# Patient Record
Sex: Male | Born: 1990 | Hispanic: Yes | Marital: Single | Smoking: Former smoker
Health system: Southern US, Community
[De-identification: ages and names within clinical notes are randomized; demographics above are authoritative.]

## PROBLEM LIST (undated history)

## (undated) DIAGNOSIS — R0600 Dyspnea, unspecified: Secondary | ICD-10-CM

## (undated) DIAGNOSIS — I10 Essential (primary) hypertension: Secondary | ICD-10-CM

## (undated) DIAGNOSIS — N186 End stage renal disease: Secondary | ICD-10-CM

---
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
## 2019-03-29 ENCOUNTER — Inpatient Hospital Stay (HOSPITAL_COMMUNITY): Payer: Medicaid Other

## 2019-03-29 ENCOUNTER — Encounter (HOSPITAL_COMMUNITY): Payer: Self-pay | Admitting: Gastroenterology

## 2019-03-29 ENCOUNTER — Encounter (HOSPITAL_COMMUNITY)
Admission: EM | Disposition: A | Discharge status: Home / Self Care | Payer: Self-pay | Source: Other Acute Inpatient Hospital | Attending: Internal Medicine

## 2019-03-29 ENCOUNTER — Encounter (HOSPITAL_COMMUNITY): Payer: Self-pay | Admitting: Anesthesiology

## 2019-03-29 ENCOUNTER — Inpatient Hospital Stay (HOSPITAL_COMMUNITY)
Admission: EM | Admit: 2019-03-29 | Discharge: 2019-04-06 | DRG: 356 | Disposition: A | Discharge status: Home / Self Care | Payer: Medicaid Other | Source: Other Acute Inpatient Hospital | Attending: Internal Medicine | Admitting: Internal Medicine

## 2019-03-29 DIAGNOSIS — K2210 Ulcer of esophagus without bleeding: Secondary | ICD-10-CM | POA: Diagnosis not present

## 2019-03-29 DIAGNOSIS — E871 Hypo-osmolality and hyponatremia: Secondary | ICD-10-CM | POA: Diagnosis not present

## 2019-03-29 DIAGNOSIS — Z452 Encounter for adjustment and management of vascular access device: Secondary | ICD-10-CM

## 2019-03-29 DIAGNOSIS — N179 Acute kidney failure, unspecified: Secondary | ICD-10-CM | POA: Diagnosis not present

## 2019-03-29 DIAGNOSIS — K254 Chronic or unspecified gastric ulcer with hemorrhage: Secondary | ICD-10-CM | POA: Diagnosis not present

## 2019-03-29 DIAGNOSIS — K922 Gastrointestinal hemorrhage, unspecified: Secondary | ICD-10-CM

## 2019-03-29 DIAGNOSIS — R34 Anuria and oliguria: Secondary | ICD-10-CM | POA: Diagnosis not present

## 2019-03-29 DIAGNOSIS — R569 Unspecified convulsions: Secondary | ICD-10-CM | POA: Diagnosis present

## 2019-03-29 DIAGNOSIS — Z01818 Encounter for other preprocedural examination: Secondary | ICD-10-CM

## 2019-03-29 DIAGNOSIS — I161 Hypertensive emergency: Secondary | ICD-10-CM | POA: Diagnosis present

## 2019-03-29 DIAGNOSIS — I120 Hypertensive chronic kidney disease with stage 5 chronic kidney disease or end stage renal disease: Secondary | ICD-10-CM | POA: Diagnosis not present

## 2019-03-29 DIAGNOSIS — Z4659 Encounter for fitting and adjustment of other gastrointestinal appliance and device: Secondary | ICD-10-CM

## 2019-03-29 DIAGNOSIS — K2211 Ulcer of esophagus with bleeding: Secondary | ICD-10-CM | POA: Diagnosis not present

## 2019-03-29 DIAGNOSIS — K921 Melena: Secondary | ICD-10-CM

## 2019-03-29 DIAGNOSIS — J9601 Acute respiratory failure with hypoxia: Secondary | ICD-10-CM | POA: Diagnosis not present

## 2019-03-29 DIAGNOSIS — E8770 Fluid overload, unspecified: Secondary | ICD-10-CM | POA: Diagnosis not present

## 2019-03-29 DIAGNOSIS — Z9889 Other specified postprocedural states: Secondary | ICD-10-CM

## 2019-03-29 DIAGNOSIS — K250 Acute gastric ulcer with hemorrhage: Secondary | ICD-10-CM | POA: Diagnosis not present

## 2019-03-29 DIAGNOSIS — D696 Thrombocytopenia, unspecified: Secondary | ICD-10-CM | POA: Diagnosis present

## 2019-03-29 DIAGNOSIS — R0902 Hypoxemia: Secondary | ICD-10-CM

## 2019-03-29 DIAGNOSIS — K920 Hematemesis: Secondary | ICD-10-CM | POA: Diagnosis not present

## 2019-03-29 DIAGNOSIS — Z8673 Personal history of transient ischemic attack (TIA), and cerebral infarction without residual deficits: Secondary | ICD-10-CM

## 2019-03-29 DIAGNOSIS — N186 End stage renal disease: Secondary | ICD-10-CM | POA: Diagnosis not present

## 2019-03-29 DIAGNOSIS — F17210 Nicotine dependence, cigarettes, uncomplicated: Secondary | ICD-10-CM | POA: Diagnosis present

## 2019-03-29 DIAGNOSIS — I10 Essential (primary) hypertension: Secondary | ICD-10-CM

## 2019-03-29 DIAGNOSIS — I169 Hypertensive crisis, unspecified: Secondary | ICD-10-CM | POA: Diagnosis present

## 2019-03-29 DIAGNOSIS — D62 Acute posthemorrhagic anemia: Secondary | ICD-10-CM | POA: Diagnosis not present

## 2019-03-29 DIAGNOSIS — Z992 Dependence on renal dialysis: Secondary | ICD-10-CM | POA: Diagnosis not present

## 2019-03-29 DIAGNOSIS — R079 Chest pain, unspecified: Secondary | ICD-10-CM | POA: Diagnosis not present

## 2019-03-29 DIAGNOSIS — D631 Anemia in chronic kidney disease: Secondary | ICD-10-CM | POA: Diagnosis present

## 2019-03-29 DIAGNOSIS — G9349 Other encephalopathy: Secondary | ICD-10-CM | POA: Diagnosis not present

## 2019-03-29 DIAGNOSIS — M7989 Other specified soft tissue disorders: Secondary | ICD-10-CM | POA: Diagnosis not present

## 2019-03-29 DIAGNOSIS — F419 Anxiety disorder, unspecified: Secondary | ICD-10-CM | POA: Diagnosis present

## 2019-03-29 DIAGNOSIS — M79609 Pain in unspecified limb: Secondary | ICD-10-CM | POA: Diagnosis not present

## 2019-03-29 DIAGNOSIS — G43909 Migraine, unspecified, not intractable, without status migrainosus: Secondary | ICD-10-CM | POA: Diagnosis present

## 2019-03-29 DIAGNOSIS — J811 Chronic pulmonary edema: Secondary | ICD-10-CM

## 2019-03-29 HISTORY — DX: Essential (primary) hypertension: I10

## 2019-03-29 HISTORY — PX: ESOPHAGOGASTRODUODENOSCOPY (EGD) WITH PROPOFOL: SHX5813

## 2019-03-29 HISTORY — PX: HEMOSTASIS CLIP PLACEMENT: SHX6857

## 2019-03-29 LAB — COMPREHENSIVE METABOLIC PANEL
ALT: 30 U/L (ref 0–44)
AST: 20 U/L (ref 15–41)
Albumin: 1.9 g/dL — ABNORMAL LOW (ref 3.5–5.0)
Alkaline Phosphatase: 57 U/L (ref 38–126)
Anion gap: 17 — ABNORMAL HIGH (ref 5–15)
BUN: 143 mg/dL — ABNORMAL HIGH (ref 6–20)
CO2: 14 mmol/L — ABNORMAL LOW (ref 22–32)
Calcium: 6.7 mg/dL — ABNORMAL LOW (ref 8.9–10.3)
Chloride: 100 mmol/L (ref 98–111)
Creatinine, Ser: 21.61 mg/dL — ABNORMAL HIGH (ref 0.61–1.24)
GFR calc Af Amer: 3 mL/min — ABNORMAL LOW (ref >60)
GFR calc non Af Amer: 2 mL/min — ABNORMAL LOW (ref >60)
Glucose, Bld: 118 mg/dL — ABNORMAL HIGH (ref 70–99)
Potassium: 4.9 mmol/L (ref 3.5–5.1)
Sodium: 131 mmol/L — ABNORMAL LOW (ref 135–145)
Total Bilirubin: 0.7 mg/dL (ref 0.3–1.2)
Total Protein: 3.6 g/dL — ABNORMAL LOW (ref 6.5–8.1)

## 2019-03-29 LAB — CBC WITH DIFFERENTIAL/PLATELET
Abs Immature Granulocytes: 0 10*3/uL (ref 0.00–0.07)
Basophils Absolute: 0 10*3/uL (ref 0.0–0.1)
Basophils Relative: 0 %
Eosinophils Absolute: 0.1 10*3/uL (ref 0.0–0.5)
Eosinophils Relative: 1 %
HCT: 13.8 % — ABNORMAL LOW (ref 39.0–52.0)
Hemoglobin: 4.8 g/dL — CL (ref 13.0–17.0)
Lymphocytes Relative: 4 %
Lymphs Abs: 0.6 10*3/uL — ABNORMAL LOW (ref 0.7–4.0)
MCH: 30.6 pg (ref 26.0–34.0)
MCHC: 34.8 g/dL (ref 30.0–36.0)
MCV: 87.9 fL (ref 80.0–100.0)
Monocytes Absolute: 0 10*3/uL — ABNORMAL LOW (ref 0.1–1.0)
Monocytes Relative: 0 %
Neutro Abs: 13.8 10*3/uL — ABNORMAL HIGH (ref 1.7–7.7)
Neutrophils Relative %: 95 %
Platelets: 148 10*3/uL — ABNORMAL LOW (ref 150–400)
RBC: 1.57 MIL/uL — ABNORMAL LOW (ref 4.22–5.81)
RDW: 15 % (ref 11.5–15.5)
WBC: 14.5 10*3/uL — ABNORMAL HIGH (ref 4.0–10.5)
nRBC: 0 % (ref 0.0–0.2)
nRBC: 6 /100 WBC — ABNORMAL HIGH

## 2019-03-29 LAB — POCT I-STAT 7, (LYTES, BLD GAS, ICA,H+H)
Acid-base deficit: 9 mmol/L — ABNORMAL HIGH (ref 0.0–2.0)
Bicarbonate: 16.1 mmol/L — ABNORMAL LOW (ref 20.0–28.0)
Calcium, Ion: 0.94 mmol/L — ABNORMAL LOW (ref 1.15–1.40)
HCT: 20 % — ABNORMAL LOW (ref 39.0–52.0)
Hemoglobin: 6.8 g/dL — CL (ref 13.0–17.0)
O2 Saturation: 98 %
Patient temperature: 99.7
Potassium: 4.6 mmol/L (ref 3.5–5.1)
Sodium: 130 mmol/L — ABNORMAL LOW (ref 135–145)
TCO2: 17 mmol/L — ABNORMAL LOW (ref 22–32)
pCO2 arterial: 32.7 mmHg (ref 32.0–48.0)
pH, Arterial: 7.303 — ABNORMAL LOW (ref 7.350–7.450)
pO2, Arterial: 109 mmHg — ABNORMAL HIGH (ref 83.0–108.0)

## 2019-03-29 LAB — PREPARE RBC (CROSSMATCH)

## 2019-03-29 LAB — GLUCOSE, CAPILLARY
Glucose-Capillary: 105 mg/dL — ABNORMAL HIGH (ref 70–99)
Glucose-Capillary: 111 mg/dL — ABNORMAL HIGH (ref 70–99)
Glucose-Capillary: 84 mg/dL (ref 70–99)
Glucose-Capillary: 90 mg/dL (ref 70–99)

## 2019-03-29 LAB — CBC
HCT: 12.7 % — ABNORMAL LOW (ref 39.0–52.0)
Hemoglobin: 4.3 g/dL — CL (ref 13.0–17.0)
MCH: 30.1 pg (ref 26.0–34.0)
MCHC: 33.9 g/dL (ref 30.0–36.0)
MCV: 88.8 fL (ref 80.0–100.0)
Platelets: 121 10*3/uL — ABNORMAL LOW (ref 150–400)
RBC: 1.43 MIL/uL — ABNORMAL LOW (ref 4.22–5.81)
RDW: 15.3 % (ref 11.5–15.5)
WBC: 12.1 10*3/uL — ABNORMAL HIGH (ref 4.0–10.5)
nRBC: 0 % (ref 0.0–0.2)

## 2019-03-29 LAB — HEMOGLOBIN AND HEMATOCRIT, BLOOD
HCT: 20.7 % — ABNORMAL LOW (ref 39.0–52.0)
Hemoglobin: 7.2 g/dL — ABNORMAL LOW (ref 13.0–17.0)

## 2019-03-29 LAB — PROTIME-INR
INR: 1.4 — ABNORMAL HIGH (ref 0.8–1.2)
Prothrombin Time: 16.6 seconds — ABNORMAL HIGH (ref 11.4–15.2)

## 2019-03-29 LAB — LACTIC ACID, PLASMA: Lactic Acid, Venous: 0.9 mmol/L (ref 0.5–1.9)

## 2019-03-29 LAB — ABO/RH: ABO/RH(D): A POS

## 2019-03-29 LAB — MRSA PCR SCREENING: MRSA by PCR: NEGATIVE

## 2019-03-29 LAB — TRIGLYCERIDES: Triglycerides: 360 mg/dL — ABNORMAL HIGH (ref <150)

## 2019-03-29 SURGERY — ESOPHAGOGASTRODUODENOSCOPY (EGD) WITH PROPOFOL
Anesthesia: Monitor Anesthesia Care

## 2019-03-29 MED ORDER — CHLORHEXIDINE GLUCONATE 0.12 % MT SOLN
15.0000 mL | Freq: Two times a day (BID) | OROMUCOSAL | Status: DC
  Administered 2019-03-29 – 2019-04-06 (×15): 15 mL via OROMUCOSAL
  Filled 2019-03-29 (×10): qty 15

## 2019-03-29 MED ORDER — ETOMIDATE 2 MG/ML IV SOLN
20.0000 mg | Freq: Once | INTRAVENOUS | Status: AC
  Administered 2019-03-29: 20 mg via INTRAVENOUS

## 2019-03-29 MED ORDER — FENTANYL CITRATE (PF) 100 MCG/2ML IJ SOLN
100.0000 ug | Freq: Once | INTRAMUSCULAR | Status: AC
  Administered 2019-03-29: 100 ug via INTRAVENOUS

## 2019-03-29 MED ORDER — MIDAZOLAM HCL 2 MG/2ML IJ SOLN
INTRAMUSCULAR | Status: AC
  Filled 2019-03-29: qty 2

## 2019-03-29 MED ORDER — MIDAZOLAM HCL (PF) 5 MG/ML IJ SOLN
INTRAMUSCULAR | Status: AC
  Filled 2019-03-29: qty 1

## 2019-03-29 MED ORDER — HYDRALAZINE HCL 20 MG/ML IJ SOLN
5.0000 mg | INTRAMUSCULAR | Status: DC | PRN
  Administered 2019-03-29 – 2019-04-02 (×3): 5 mg via INTRAVENOUS
  Filled 2019-03-29 (×3): qty 1

## 2019-03-29 MED ORDER — SODIUM CHLORIDE 0.9 % IV SOLN
80.0000 mg | Freq: Two times a day (BID) | INTRAVENOUS | Status: DC
  Administered 2019-03-29 (×2): 80 mg via INTRAVENOUS
  Filled 2019-03-29 (×6): qty 80

## 2019-03-29 MED ORDER — FENTANYL CITRATE (PF) 100 MCG/2ML IJ SOLN
INTRAMUSCULAR | Status: AC
  Filled 2019-03-29: qty 2

## 2019-03-29 MED ORDER — SODIUM CHLORIDE 0.9% IV SOLUTION
Freq: Once | INTRAVENOUS | Status: AC
  Administered 2019-03-29: 18:00:00 via INTRAVENOUS

## 2019-03-29 MED ORDER — LABETALOL HCL 5 MG/ML IV SOLN
10.0000 mg | Freq: Three times a day (TID) | INTRAVENOUS | Status: DC
  Administered 2019-03-29 – 2019-04-01 (×6): 10 mg via INTRAVENOUS
  Filled 2019-03-29 (×7): qty 4

## 2019-03-29 MED ORDER — ACETAMINOPHEN 325 MG PO TABS: 650.0000 mg | ORAL_TABLET | ORAL | Status: DC | PRN

## 2019-03-29 MED ORDER — LEVETIRACETAM IN NACL 500 MG/100ML IV SOLN
500.0000 mg | Freq: Two times a day (BID) | INTRAVENOUS | Status: DC
  Administered 2019-03-29 – 2019-03-31 (×6): 500 mg via INTRAVENOUS
  Filled 2019-03-29 (×7): qty 100

## 2019-03-29 MED ORDER — SODIUM CHLORIDE 0.9% IV SOLUTION
Freq: Once | INTRAVENOUS | Status: AC
  Administered 2019-03-29: 13:00:00 via INTRAVENOUS

## 2019-03-29 MED ORDER — SODIUM CHLORIDE 0.9 % IV SOLN
25.0000 ug | Freq: Once | INTRAVENOUS | Status: AC
  Administered 2019-03-29: 16:00:00 25 ug via INTRAVENOUS
  Filled 2019-03-29: qty 6.25

## 2019-03-29 MED ORDER — CHLORHEXIDINE GLUCONATE CLOTH 2 % EX PADS
6.0000 | MEDICATED_PAD | Freq: Every day | CUTANEOUS | Status: DC
  Administered 2019-03-30: 6 via TOPICAL

## 2019-03-29 MED ORDER — CLEVIDIPINE BUTYRATE 0.5 MG/ML IV EMUL
0.0000 mg/h | INTRAVENOUS | Status: DC
  Administered 2019-03-29: 6 mg/h via INTRAVENOUS
  Filled 2019-03-29 (×2): qty 50

## 2019-03-29 MED ORDER — ACETAMINOPHEN 650 MG RE SUPP
650.0000 mg | RECTAL | Status: DC | PRN
  Filled 2019-03-29: qty 1

## 2019-03-29 MED ORDER — FENTANYL CITRATE (PF) 100 MCG/2ML IJ SOLN: 50.0000 ug | INTRAMUSCULAR | Status: DC | PRN

## 2019-03-29 MED ORDER — LABETALOL HCL 100 MG PO TABS
50.0000 mg | ORAL_TABLET | Freq: Two times a day (BID) | ORAL | Status: DC
  Filled 2019-03-29 (×2): qty 0.5

## 2019-03-29 MED ORDER — ORAL CARE MOUTH RINSE
15.0000 mL | Freq: Two times a day (BID) | OROMUCOSAL | Status: DC
  Administered 2019-03-30 – 2019-04-06 (×13): 15 mL via OROMUCOSAL

## 2019-03-29 MED ORDER — HYDRALAZINE HCL 50 MG PO TABS
50.0000 mg | ORAL_TABLET | Freq: Three times a day (TID) | ORAL | Status: DC
  Filled 2019-03-29 (×2): qty 1

## 2019-03-29 MED ORDER — AMLODIPINE BESYLATE 10 MG PO TABS
10.0000 mg | ORAL_TABLET | Freq: Once | ORAL | Status: DC
  Filled 2019-03-29: qty 1

## 2019-03-29 MED ORDER — ONDANSETRON HCL 4 MG/2ML IJ SOLN
4.0000 mg | Freq: Four times a day (QID) | INTRAMUSCULAR | Status: DC | PRN
  Administered 2019-04-03 – 2019-04-06 (×2): 4 mg via INTRAVENOUS
  Filled 2019-03-29 (×3): qty 2

## 2019-03-29 MED ORDER — EPINEPHRINE 1 MG/10ML IJ SOSY
PREFILLED_SYRINGE | INTRAMUSCULAR | Status: AC
  Filled 2019-03-29: qty 20

## 2019-03-29 MED ORDER — MIDAZOLAM HCL (PF) 10 MG/2ML IJ SOLN
INTRAMUSCULAR | Status: DC | PRN
  Administered 2019-03-29: 2 mg via INTRAVENOUS
  Administered 2019-03-29: 1 mg via INTRAVENOUS
  Administered 2019-03-29: 2 mg via INTRAVENOUS

## 2019-03-29 MED ORDER — ROCURONIUM BROMIDE 50 MG/5ML IV SOLN
80.0000 mg | Freq: Once | INTRAVENOUS | Status: AC
  Administered 2019-03-29: 15:00:00 80 mg via INTRAVENOUS

## 2019-03-29 MED ORDER — MIDAZOLAM HCL 2 MG/2ML IJ SOLN
2.0000 mg | Freq: Once | INTRAMUSCULAR | Status: AC
  Administered 2019-03-29: 15:00:00 2 mg via INTRAVENOUS

## 2019-03-29 MED ORDER — PROPOFOL 1000 MG/100ML IV EMUL
5.0000 ug/kg/min | INTRAVENOUS | Status: DC
  Administered 2019-03-29: 50 ug/kg/min via INTRAVENOUS
  Administered 2019-03-29: 20 ug/kg/min via INTRAVENOUS
  Administered 2019-03-29: 50 ug/kg/min via INTRAVENOUS
  Administered 2019-03-30 (×2): 80 ug/kg/min via INTRAVENOUS
  Administered 2019-03-30 (×2): 70 ug/kg/min via INTRAVENOUS
  Filled 2019-03-29 (×8): qty 100

## 2019-03-29 MED ORDER — FENTANYL 2500MCG IN NS 250ML (10MCG/ML) PREMIX INFUSION
0.0000 ug/h | INTRAVENOUS | Status: DC
  Administered 2019-03-29: 25 ug/h via INTRAVENOUS
  Administered 2019-03-30: 200 ug/h via INTRAVENOUS
  Filled 2019-03-29 (×3): qty 250

## 2019-03-29 SURGICAL SUPPLY — 15 items

## 2019-03-29 NOTE — Progress Notes (Signed)
Obtained ABG, it read; PH 7.31 PCO2 31.8 PO2 105 HCO3 16.1 Hb 6.8 RN notified.

## 2019-03-29 NOTE — Progress Notes (Signed)
Received call from Endo, Anesthesiologist states does not want to do EGD, feels pt is not stable enough.  Relayed to CCM MD and GI, plan for bedside EGD

## 2019-03-29 NOTE — Progress Notes (Addendum)
Pt w/ copious amounts of bloody liquid stool.  Received call from lab, pts hgb 4.3.  Relayed all to CCM MD, PRBCs ordered stat, GI consulted

## 2019-03-29 NOTE — Progress Notes (Signed)
Spoke w/ HD RN who states pt to receive HD tomorrow due to numerous blood products currently running.

## 2019-03-29 NOTE — Progress Notes (Addendum)
eLink Physician-Brief Progress Note Patient Name: [name] DOB: June 18, 1991 MRN: [id number]   Date of Service  03/29/2019  HPI/Events of Note  A 28 year old male admitted to the ICU with hypertensive emergency.  Could not give any medications to NG/OG because the patient does not have any tube secondary to upper GI bleed status post EGD with clipping.   eICU Interventions  Changed PRN oral Tylenol to PRN Tylenol suppository.  Changed from oral labetalol to scheduled IV labetalol.  Discontinued oral hydralazine and can use PRN IV hydralazine which is already ordered.     Intervention Category Intermediate Interventions: Medication change / dose adjustment;Hypertension - evaluation and management;Communication with other healthcare providers and/or family  Mady Gemma 03/29/2019, 10:23 PM   2:32 AM Hemoglobin dropped from 7.2 to 6.9. Patient is hemodynamically stable.   Ordered 1 unit of packed RBC for transfusion

## 2019-03-29 NOTE — Procedures (Signed)
Central Venous Catheter Insertion Procedure Note [name] [id number] 03-Aug-1991  Procedure: Insertion of Central Venous Catheter Indications: Assessment of intravascular volume, Drug and/or fluid administration and Frequent blood sampling  Procedure Details Consent: Risks of procedure as well as the alternatives and risks of each were explained to the (patient/caregiver).  Consent for procedure obtained. Time Out: Verified patient identification, verified procedure, site/side was marked, verified correct patient position, special equipment/implants available, medications/allergies/relevent history reviewed, required imaging and test results available.  Performed  Maximum sterile technique was used including antiseptics, cap, gloves, gown, hand hygiene, mask and sheet. Skin prep: Chlorhexidine; local anesthetic administered A antimicrobial bonded/coated triple lumen catheter was placed in the left internal jugular vein to 20 cm using the Seldinger technique. Biopatch applied, sutured in place.  Evaluation Blood flow good Complications: No apparent complications Patient tolerated the procedure well. Chest X-ray ordered to verify placement.  CXR: pending.   Procedure performed under direct supervision of Dr. Tamala Julian and with ultrasound guidance for real time vessel cannulation.     Noe Gens, NP-C [facility] Pgr: [phone] or if no answer [phone] 03/29/2019, 1:55 PM

## 2019-03-29 NOTE — Consult Note (Signed)
[name] Admit Date: 03/29/2019 03/29/2019 [name] Requesting Physician:  Tamala Julian MD  Reason for Consult:  Renal Failure, Azotemia, Anemia HPI:  28 year old male transferred in this morning from [facility].    PMH Incudes:  Unclear, patient not forthcoming, but appears receives very little medical care  Creatinine of 1.4 on 11/2017  Patient was admitted approximately 1 week ago after presenting with nausea/vomiting/diarrhea and then returning with a seizure-like episode and severe hypertension.  Patient initially required parenteral hypertensive medications and subsequently improved with transition to orals.  Patient also identified to have acute on chronic renal failure and initiated hemodialysis at outside facility.  During course of admission patient developed anemia and hematochezia and had hematemesis.  Underwent colonoscopy with findings concerning for ischemic colitis, EGD with findings of ulcers at the GE junction with intervention performed.  Unfortunately patient continued to have evidence of upper and lower hemorrhage and was transferred for surgical evaluation.  Admission hemoglobin here is 4.8, on repeat 4.1.  Patient has a right IJ tunneled dialysis catheter.  Currently, BUN is 143, creatinine 21.6.  His creatinine was 1.39 12/02/2017 in care everywhere.    Creatinine, Ser (mg/dL)  Date Value  03/29/2019 21.61 (H)  ]  ROS Balance of 12 systems is negative w/ exceptions as above  PMH No past medical history on file. PSH [facility] No family history on file. SH  has no history on file for tobacco, alcohol, and drug. Allergies Not on File Home medications Prior to Admission medications   Not on File    Current Medications Scheduled Meds: . amLODipine  10 mg Oral Once  . chlorhexidine  15 mL Mouth Rinse BID  . hydrALAZINE  50 mg Oral Q8H  . labetalol  50 mg Oral BID  . mouth rinse  15 mL Mouth Rinse q12n4p   Continuous Infusions: .  clevidipine Stopped (03/29/19 1211)  . desmopressin (DDAVP) IV    . levETIRAcetam    . pantoprazole (PROTONIX) IV 80 mg (03/29/19 1207)   PRN Meds:.acetaminophen, fentaNYL (SUBLIMAZE) injection, hydrALAZINE, ondansetron (ZOFRAN) IV  CBC Recent Labs  Lab 03/29/19 1135 03/29/19 1243  WBC 14.5* 12.1*  NEUTROABS 13.8*  --   HGB 4.8* 4.3*  HCT 13.8* 12.7*  MCV 87.9 88.8  PLT 148* 174*   Basic Metabolic Panel Recent Labs  Lab 03/29/19 1135  NA 131*  K 4.9  CL 100  CO2 14*  GLUCOSE 118*  BUN 143*  CREATININE 21.61*  CALCIUM 6.7*    Physical Exam  Blood pressure (!) 134/91, pulse 91, temperature 98.3 F (36.8 C), temperature source Oral, resp. rate 17, SpO2 99 %. GEN: Patient provides very little information, alert and awake ENT: NCAT EYES: EOMI CV: RRR, normal S1 and S2 PULM: Anterior auscultation, clear ABD: Mild diffuse tenderness, no rebound or guarding SKIN: No rashes or lesions, right IJ tunneled dialysis catheter bandaged with no erythema or purulence EXT: No peripheral edema  Assessment 12M with severe renal failure of unclear chronicity (Had SCr 1.4 11/2017) and severe anemia with gastrointestinal hemorrhage  1. Renal failure, unclear chronicity: Started HD at outside facility; significant azotemia related to renal failure and GI hemorrhage; has R IJ TDC 2. UGIB and LGIB, GI to eval, GE ulcers and ? Ischemic colitis at OSH 3. ABLA Severe 4. HTN, stable currently  Plan 1. HD today once hemoglobin greater than 6 and patient hemodynamically stable: 2h, No UF, 2K, TDC, Qb 250, no heparin 2. Likely will require HD  again tomorrow, gradual up titration of dialysis intensity 3. Start Aranesp 142mcg today 4. At dialysis if hemoglobin is less than 7 will transfuse 2 units of PRBCs 5. Will follow along closely 6. Daily weights, Daily Renal Panel, Strict I/Os, Avoid nephrotoxins (NSAIDs, judicious IV Contrast)    Pearson Grippe MD 03/29/2019, 2:11 PM

## 2019-03-29 NOTE — H&P (Signed)
NAME:  [name], MRN:  [id number], DOB:  February 25, 1991, LOS: 0 ADMISSION DATE:  03/29/2019, CONSULTATION DATE:  03/29/2019 REFERRING MD:  [facility], CHIEF COMPLAINT:  HTN   Brief History   28 y.o male with CKD Stage V, malignant HTN, and prior CVA presented to an outside facility with hypertensive emergency (seizure, possibly ischemic colitis) and UGIB. Managed acutely with IV Clevidipine and emergent EGD. S/p 3 clips to a gastric ulcer but continued to have hematemesis and transferred to [facility] for further evaluation.   History of present illness   Presented to an outside hospital with 5 days of N/V and diffuse abdominal pain. Initially found to have a BP >200/100. While in the ED had witness seizure like activity. He was started on a Clevidine drip and admitted for further evaluation. Labs were significant for acute on chronic renal failure. CT of the head was unremarkable, followed by a CT abdomen that illustrated acute colitis. While hospitalized he then developed  Hematemesis and hematochezia. Subsequently went for EGD and sigmoidoscopy. EGD illustrated diffuse gastritis and a single gastric ulcer actively bleeding. S/p 3 clips. Sigmoidoscopy illustrated diffuse friable, erythematous, boggy tissue. Biopsies taken but the report is unavailable. GI pathogen panel including C. Diff, was negative but he was continued on IV metronidazole. The morning of transfer he continued to have diffuse abdominal pain with hematemesis and he was transferred for further evaluation.    In regards to the patient's renal failure he is baseline CKD Stage V that has since progressed with this acute event. He has a HD cath in the right IJ. Last session on 7/17.   Past Medical History  Malignant HTN  CKD Stage V CVA  Significant Hospital Events   EGD > diffuse gastritis and a single gastric ulcer actively bleeding Sigmoidoscopy > diffuse friable, erythematous, boggy tissue HD > Last session 7/17  Consults:   GI  Nephrology   Procedures:  Right IJ HD cath 7/17  Significant Diagnostic Tests:  Outside:  CT head > unremarkable  CT abdomen > diffuse colitis  EGD > diffuse gastritis and a single gastric ulcer actively bleeding Sigmoidoscopy > diffuse friable, erythematous, boggy tissue  Micro Data:  GI pathogen panel negative  C. Diff negative  COVID negative   Antimicrobials:  Metronidazole    Interim history/subjective:  Continues to have nausea. Lethargic.   Objective   Blood pressure (!) 130/94, pulse 95, temperature 99.5 F (37.5 C), temperature source Oral, resp. rate 17, SpO2 96 %.       No intake or output data in the 24 hours ending 03/29/19 1101 There were no vitals filed for this visit.  Examination: General: Well nourished male, resting comfortably  HENT: Normocephalic, atraumatic, dry mucus membranes Pulm: Good air movement with no wheezing or crackles  CV: RRR, no murmurs, no rubs  Abdomen: Hypoactive bowel sounds, soft, non-distended, diffuse tenderness to palpation  Extremities: Pulses palpable in all extremities, no LE edema  Skin: Warm and dry  Neuro: Lethargic but wake to verbal and physical stimuli   Resolved Hospital Problem list   None  Assessment & Plan:   Hypertensive Emergency  - BP initially > 200/100 and patient had seizure like activity with worsening renal function  - Try to wean IV Clevidine drip. Goal BP <150/100 - Restart home Amlodipine, Labetalol, and Hydralazine  - PRNs to control nausea  - UDS negative at outside hospital  - Needs investigation for secondary causes of malignant HTN   UGIB, gastric ulcer  -  Repeat H&H  - Type and screen  - 2 large bore peripheral IVs  - IV PPI 80 mg BID  - Transfuse for Hgb <7.0 or hemodynamically unstable  - Will consult GI if continues to have hematemesis/hematochezia  Acute Colitis  - Likely ischemic vs infectious  - Hold Abx for now   CKD Stage V  - Right HD cath in place  - Last HD  session 7/17  - Repeat CMP and CBC  - Consult Nephrology  - Renal ultrasound/doppler  - UA   Best practice:  Diet: Clear liquids  Pain/Anxiety/Delirium protocol (if indicated): None VAP protocol (if indicated): None DVT prophylaxis: SCDs GI prophylaxis: Protonix Glucose control: Monitor and start if needed Mobility: As tolerated Code Status: Full Family Communication: Family notified  Disposition: ICU  Labs   CBC: No results for input(s): WBC, NEUTROABS, HGB, HCT, MCV, PLT in the last 168 hours.  Basic Metabolic Panel: No results for input(s): NA, K, CL, CO2, GLUCOSE, BUN, CREATININE, CALCIUM, MG, PHOS in the last 168 hours. GFR: CrCl cannot be calculated (No successful lab value found.). No results for input(s): PROCALCITON, WBC, LATICACIDVEN in the last 168 hours.  Liver Function Tests: No results for input(s): AST, ALT, ALKPHOS, BILITOT, PROT, ALBUMIN in the last 168 hours. No results for input(s): LIPASE, AMYLASE in the last 168 hours. No results for input(s): AMMONIA in the last 168 hours.  ABG No results found for: PHART, PCO2ART, PO2ART, HCO3, TCO2, ACIDBASEDEF, O2SAT   Coagulation Profile: No results for input(s): INR, PROTIME in the last 168 hours.  Cardiac Enzymes: No results for input(s): CKTOTAL, CKMB, CKMBINDEX, TROPONINI in the last 168 hours.  HbA1C: No results found for: HGBA1C  CBG: No results for input(s): GLUCAP in the last 168 hours.  Review of Systems:   12 point ROS complete. All negative aside from those mentioned in HPI  Past Medical History  He,  has no past medical history on file.   Surgical History   No surgical history   Social History     Lives with his fiance. Denies the use of illicit substances.   Family History   His family history is not on file.   Allergies Not on File   Home Medications  Prior to Admission medications   Not on File         Ina Homes, MD  [facility] PGY 3  Pager: [phone]

## 2019-03-29 NOTE — Procedures (Signed)
Intubation Procedure Note [name] [id number] March 06, 1991  Procedure: Intubation Indications: Airway protection and maintenance  Procedure Details Consent: Risks of procedure as well as the alternatives and risks of each were explained to the (patient/caregiver).  Consent for procedure obtained. Time Out: Verified patient identification, verified procedure, site/side was marked, verified correct patient position, special equipment/implants available, medications/allergies/relevent history reviewed, required imaging and test results available.  Performed  Maximum sterile technique was used including antiseptics, cap, gloves, hand hygiene and mask.  MAC and 3    Evaluation Hemodynamic Status: BP stable throughout; O2 sats: stable throughout Patient's Current Condition: stable Complications: No apparent complications Patient did tolerate procedure well. Chest X-ray ordered to verify placement.  CXR: pending.   [name] 03/29/2019

## 2019-03-29 NOTE — Op Note (Signed)
[facility] Patient Name: [name] Procedure Date : 03/29/2019 MRN: [id number] Attending MD: Mauri Pole , MD Date of Birth: Dec 17, 1990 CSN: [id number] Age: 28 Admit Type: Inpatient Procedure:                Upper GI endoscopy Indications:              Active gastrointestinal bleeding Providers:                Mauri Pole, MD, William Dalton,                            Technician, Glori Bickers, RN Referring MD:              Medicines:                Monitored Anesthesia Care Complications:            No immediate complications. Estimated Blood Loss:     Estimated blood loss was minimal. Procedure:                Pre-Anesthesia Assessment:                           - Prior to the procedure, a History and Physical                            was performed, and patient medications and                            allergies were reviewed. The patient's tolerance of                            previous anesthesia was also reviewed. The risks                            and benefits of the procedure and the sedation                            options and risks were discussed with the patient.                            All questions were answered, and informed consent                            was obtained. Prior Anticoagulants: The patient has                            taken no previous anticoagulant or antiplatelet                            agents. ASA Grade Assessment: IV - A patient with                            severe systemic disease that is a constant threat  to life. After reviewing the risks and benefits,                            the patient was deemed in satisfactory condition to                            undergo the procedure.                           After obtaining informed consent, the endoscope was                            passed under direct vision. Throughout the                            procedure, the  patient's blood pressure, pulse, and                            oxygen saturations were monitored continuously. The                            [id number]-H190 ([id number]) Olympus gastroscope was                            introduced through the mouth, and advanced to the                            second part of duodenum. The [id number]-[id number] ([id number])                            Olympus therapeutic gastroscope was introduced                            through the mouth, and advanced to the second part                            of duodenum. The upper GI endoscopy was technically                            difficult and complex due to presence of bezoar.                            The patient tolerated the procedure well. Scope In: Scope Out: Findings:      One cratered esophageal ulcer with no bleeding and stigmata of recent       bleeding, adherent clot and hemostatic clip was found 38 to 39 cm from       the incisors. The lesion was 5 mm in largest dimension. For hemostasis,       two hemostatic clips were successfully placed (MR conditional). There       was no bleeding at the end of the procedure.      Large blood clots and food bezoar was found in the gastric fundus.       Attempted removal of clots with roth net and also through  therapeutic       scope, incomplete removal with limited visualization of the gastric       fundus. No active oozing or bleeding      One non-obstructing non-bleeding cratered gastric ulcer with pigmented       material was found in the prepyloric region of the stomach. The lesion       was 10 mm in largest dimension.      The examined duodenum was normal. Impression:               - Non-bleeding esophageal ulcer. Clips (MR                            conditional) were placed.                           - Red blood in the gastric fundus.                           - Non-obstructing non-bleeding gastric ulcer with                            pigmented material.                            - Normal examined duodenum.                           - No specimens collected. Recommendation:           - Patient has a contact number available for                            emergencies. The signs and symptoms of potential                            delayed complications were discussed with the                            patient. Return to normal activities tomorrow.                            Written discharge instructions were provided to the                            patient.                           - Continue PPI gtt                           - NPO.                           - Continue present medications.                           - Repeat upper endoscopy for retreatment if has  persistent bleeding with downtrending Hgb.                           - If hemodynamically unstable, consult IR for                            embolization Procedure Code(s):        --- Professional ---                           [phone], Esophagogastroduodenoscopy, flexible,                            transoral; with control of bleeding, any method Diagnosis Code(s):        --- Professional ---                           K22.10, Ulcer of esophagus without bleeding                           K92.2, Gastrointestinal hemorrhage, unspecified                           K25.9, Gastric ulcer, unspecified as acute or                            chronic, without hemorrhage or perforation CPT copyright 2019 [employer]. All rights reserved. The codes documented in this report are preliminary and upon coder review may  be revised to meet current compliance requirements. Mauri Pole, MD 03/29/2019 4:58:07 PM This report has been signed electronically. Number of Addenda: 0

## 2019-03-29 NOTE — Progress Notes (Signed)
Pt was private pt at [facility].  When asked, pt states does not want to be private pt here, states does not need to create password.  Spoke w/ pts fiance Kathi Der and pts aunt (pt states may provide information to both) to provide updates on pt. Visitation policies also explained.  Both appreciative and state understanding.

## 2019-03-29 NOTE — Progress Notes (Signed)
Per ordering provider, Dr. Tamala Julian , EEG to be done tomorrow due to multiple procedures being done for this patient.

## 2019-03-29 NOTE — Consult Note (Addendum)
[name]: 12:20 PM 03/29/2019  LOS: 0 days    Referring Provider: Dr Tamala Julian  Primary Care Physician: Previous interactions with [place], transitional care on [facility] in [place] Primary Gastroenterologist:  Dr. Trenton Founds in [place].  unassigned [employer].       Reason for Consultation: Hematemesis, burgundy/bloody stool.  Abdominal pain.   HPI: [name] is a 28 y.o. male.  Hx hypertension. CVA.  Migraines.  Patient says that for about 6 days he had been having black/bloody loose stools and abdominal pain especially in the lower abdomen.  For 3 days he been vomiting up dark and burgundy material.  No previous history of ulcers or intestinal problems. 03/23/2019 admission to [facility].  AKI requiring nation of hemodialysis.  CTAP showed diffuse colitis. 03/28/2019 EGD showed localized ulceration, granularity, friability, dripping fresh blood at the GE junction and cardia.  Endoclips were applied, cautery was applied and epinephrine injected.  Diffuse erythema in the body and antrum of the stomach.  Biopsies obtained.  03/28/2019 flexible sigmoidoscopy.  Erythema, friability, congestion at the distal transverse, descending and sigmoid colon, biopsied.  Relatively normal-looking rectal mucosa.  No active bleeding.  Progress notes mention patient having bit his tongue during the seizure.  Treated with IV metronidazole.  C. Difficile and other stool pathogen studies all negative.  Continue to have pain and hematemesis yesterday morning and was transferred to [facility].  Hepatitis a IgM, hepatitis B core IgM, hepatitis B surface antigen, hepatitis C antibody, all negative or nonreactive.  Hepatitis B surface antibody quantitative 28 Patient  recalls transfusion with 2 PRBCs during the admission in [place]. Prior to the onset of the diarrhea and abdominal pain, the patient did not use NSAIDs but he did start using 2-3 Aleve a day after the pain and bloody diarrhea started.  Consumes 2 or 3 "white liquor" drinks daily.  This morning he was still vomiting bloody material and has been passing burgundy bloody stools. Blood pressure improved but still hypertensive. Hgb this morning 4.8.  2 U PRBCs ordered but not initiated yet. IV Protonix drip in place.  Patient takes blood pressure medication but not clear how compliant he is.  He does not have a regular doctor.  He goes to the hospital emergency rooms for his health care. Family history negative for ulcer disease, GI bleeds, cancers.    Past medical history.  See HPI.   Prior to Admission medications   Not on File    Scheduled Meds: . sodium chloride   Intravenous Once  . amLODipine  10 mg Oral Once  . chlorhexidine  15 mL Mouth Rinse BID  . hydrALAZINE  50 mg Oral Q8H  . labetalol  50 mg Oral BID  . mouth rinse  15 mL Mouth Rinse q12n4p   Infusions: . clevidipine Stopped (03/29/19 1211)  . pantoprazole (PROTONIX) IV 80 mg (03/29/19 1207)   PRN Meds: acetaminophen, ondansetron (ZOFRAN) IV   Allergies as of 03/28/2019  . (Not on File)    No  family history on file.  Social History   Socioeconomic History  . Marital status: Single    Spouse name: Not on file  . Number of children: Not on file  . Years of education: Not on file  . Highest education level: Not on file  Occupational History  . Not on file  Social Needs  . Financial resource strain: Not on file  . Food insecurity    Worry: Not on file    Inability: Not on file  . Transportation needs    Medical: Not on file    Non-medical: Not on file  Tobacco Use  . Smoking status: Not on file  Substance and Sexual Activity  . Alcohol use: Not on file  . Drug use: Not on file  . Sexual activity:  Not on file  Lifestyle  . Physical activity    Days per week: Not on file    Minutes per session: Not on file  . Stress: Not on file  Relationships  . Social Herbalist on phone: Not on file    Gets together: Not on file    Attends religious service: Not on file    Active member of club or organization: Not on file    Attends meetings of clubs or organizations: Not on file    Relationship status: Not on file  . Intimate partner violence    Fear of current or ex partner: Not on file    Emotionally abused: Not on file    Physically abused: Not on file    Forced sexual activity: Not on file  Other Topics Concern  . Not on file  Social History Narrative  . Not on file    REVIEW OF SYSTEMS: Constitutional: Weakness. ENT:  No nose bleeds Pulm: Denies shortness of breath or cough. CV:  No palpitations, no LE edema.  Denies chest pain GU:  No hematuria, no frequency GI: See HPI. Heme: Other than the recent GI bleeding, denies unusual bleeding or bruising. Transfusions: See HPI. Neuro:  No headaches, no peripheral tingling or numbness Derm:  No itching, no rash or sores.  Endocrine:  No sweats or chills.  No polyuria or dysuria Immunization: Did not ask him about prior vaccinations. Travel:  None beyond local counties in last few months.    PHYSICAL EXAM: Vital signs in last 24 hours: Vitals:   03/29/19 1100 03/29/19 1145  BP: (!) 123/91   Pulse: 94   Resp: 15   Temp: 98.5 F (36.9 C) 98.8 F (37.1 C)  SpO2: 96%    Wt Readings from Last 3 Encounters:  No data found for Wt    General: Hispanic man who is drowsy and has trouble providing a history.  Looks a bit pale, not acutely ill. Head: No facial asymmetry or swelling.  No signs of head trauma. Eyes: Conjunctiva pale.  No scleral icterus. Ears: Not hard of hearing Nose: No discharge or congestion Mouth: small islets of slightly pale, raised lesions on tongue.  Fairly deep but not bleeding ulcer at the  lateral left tongue. Neck: No JVD, no masses. Lungs: No labored breathing.  Lungs clear bilaterally. Heart: RRR. Abdomen: Tender in the lower abdomen, predominantly in the right lower quadrant.  No guarding or rebound.  Hypoactive bowel sounds.  No distention.. Rectal: Deferred Musc/Skeltl: No joint redness, swelling or erythema. Extremities: Feet are warm.  No CCE. Neurologic: Drowsy.  Oriented x3.  Moves all 4 limbs, no tremor. Skin: No rashes  or sores. Tattoos: Several on his trunk and arms   Psych: Drowsy, laconic/limited speech.  Intake/Output from previous day: No intake/output data recorded. Intake/Output this shift: No intake/output data recorded.  LAB RESULTS: Recent Labs    03/29/19 1135  WBC 14.5*  HGB 4.8*  HCT 13.8*  PLT 148*   BMET No results found for: NA, K, CL, CO2, GLUCOSE, BUN, CREATININE, CALCIUM LFT No results for input(s): PROT, ALBUMIN, AST, ALT, ALKPHOS, BILITOT, BILIDIR, IBILI in the last 72 hours. PT/INR No results found for: INR, PROTIME Hepatitis Panel No results for input(s): HEPBSAG, HCVAB, HEPAIGM, HEPBIGM in the last 72 hours. C-Diff No components found for: CDIFF Lipase  No results found for: LIPASE    RADIOLOGY STUDIES: No results found.   IMPRESSION:   *   GI bleed from ulcer at GE junction. 10/28/2018 EGD by Dr. Posey Pronto revealed the ulcer, this was endoclipped, cauterized and injected.  Has had recurrent bleeding since then. The laceration on his tongue does not look bloody but looks like it could have caused some bleeding.  *   ESRD. ? Previously undiagnosed advanced chronic kidney disease on HD.    PLAN:     *   EGD today.  Continue IV Protonix   Azucena Freed  03/29/2019, 12:20 PM Phone [phone]   Attending physician's note   I have taken a history, examined the patient and reviewed the chart. I agree with the Advanced Practitioner's note, impression and recommendations.  89 yr M with h/o CVA, malignant htn, AKI  on dialysis, anemia s/p EGD and colonoscopy in [place]  Gastric ulcer s/p hemostatic clips Hematemesis, Hgb 4.8  Intubated by ICU, has central line, getting prbc  Plan for urgent EGD bedside  Continue PPI gtt  Monitor Hgb, transfusion goal >7  Further recommendations based on EGD findings.  Damaris Hippo , MD [phone]

## 2019-03-30 ENCOUNTER — Inpatient Hospital Stay (HOSPITAL_COMMUNITY): Payer: Medicaid Other

## 2019-03-30 DIAGNOSIS — K2211 Ulcer of esophagus with bleeding: Secondary | ICD-10-CM

## 2019-03-30 DIAGNOSIS — K921 Melena: Secondary | ICD-10-CM

## 2019-03-30 DIAGNOSIS — K250 Acute gastric ulcer with hemorrhage: Secondary | ICD-10-CM

## 2019-03-30 DIAGNOSIS — K922 Gastrointestinal hemorrhage, unspecified: Secondary | ICD-10-CM

## 2019-03-30 LAB — HIV ANTIBODY (ROUTINE TESTING W REFLEX): HIV Screen 4th Generation wRfx: NONREACTIVE

## 2019-03-30 LAB — GLUCOSE, CAPILLARY
Glucose-Capillary: 86 mg/dL (ref 70–99)
Glucose-Capillary: 82 mg/dL (ref 70–99)
Glucose-Capillary: 74 mg/dL (ref 70–99)
Glucose-Capillary: 83 mg/dL (ref 70–99)
Glucose-Capillary: 105 mg/dL — ABNORMAL HIGH (ref 70–99)
Glucose-Capillary: 102 mg/dL — ABNORMAL HIGH (ref 70–99)

## 2019-03-30 LAB — HEMOGLOBIN AND HEMATOCRIT, BLOOD
HCT: 19.8 % — ABNORMAL LOW (ref 39.0–52.0)
HCT: 23.2 % — ABNORMAL LOW (ref 39.0–52.0)
HCT: 25.5 % — ABNORMAL LOW (ref 39.0–52.0)
Hemoglobin: 6.9 g/dL — CL (ref 13.0–17.0)
Hemoglobin: 8.2 g/dL — ABNORMAL LOW (ref 13.0–17.0)
Hemoglobin: 8.8 g/dL — ABNORMAL LOW (ref 13.0–17.0)

## 2019-03-30 LAB — PREPARE FRESH FROZEN PLASMA
Unit division: 0
Unit division: 0
Unit division: 0

## 2019-03-30 LAB — BPAM FFP
Blood Product Expiration Date: 202007222359
Blood Product Expiration Date: 202007222359
Blood Product Expiration Date: 202007262359
ISSUE DATE / TIME: 202007211616
ISSUE DATE / TIME: 202007211616
ISSUE DATE / TIME: 202007211616
Unit Type and Rh: 600
Unit Type and Rh: 6200
Unit Type and Rh: 6200

## 2019-03-30 LAB — CBC
HCT: 24.3 % — ABNORMAL LOW (ref 39.0–52.0)
Hemoglobin: 8.3 g/dL — ABNORMAL LOW (ref 13.0–17.0)
MCH: 31 pg (ref 26.0–34.0)
MCHC: 34.2 g/dL (ref 30.0–36.0)
MCV: 90.7 fL (ref 80.0–100.0)
Platelets: 101 10*3/uL — ABNORMAL LOW (ref 150–400)
RBC: 2.68 MIL/uL — ABNORMAL LOW (ref 4.22–5.81)
RDW: 15.2 % (ref 11.5–15.5)
WBC: 13.9 10*3/uL — ABNORMAL HIGH (ref 4.0–10.5)
nRBC: 0 % (ref 0.0–0.2)

## 2019-03-30 LAB — RENAL FUNCTION PANEL
Albumin: 2.5 g/dL — ABNORMAL LOW (ref 3.5–5.0)
Anion gap: 15 (ref 5–15)
BUN: 118 mg/dL — ABNORMAL HIGH (ref 6–20)
CO2: 19 mmol/L — ABNORMAL LOW (ref 22–32)
Calcium: 7.3 mg/dL — ABNORMAL LOW (ref 8.9–10.3)
Chloride: 101 mmol/L (ref 98–111)
Creatinine, Ser: 17.19 mg/dL — ABNORMAL HIGH (ref 0.61–1.24)
GFR calc Af Amer: 4 mL/min — ABNORMAL LOW (ref >60)
GFR calc non Af Amer: 3 mL/min — ABNORMAL LOW (ref >60)
Glucose, Bld: 86 mg/dL (ref 70–99)
Phosphorus: 7.3 mg/dL — ABNORMAL HIGH (ref 2.5–4.6)
Potassium: 3.6 mmol/L (ref 3.5–5.1)
Sodium: 135 mmol/L (ref 135–145)

## 2019-03-30 LAB — PREPARE RBC (CROSSMATCH)

## 2019-03-30 MED ORDER — NICARDIPINE HCL IN NACL 20-0.86 MG/200ML-% IV SOLN
3.0000 mg/h | INTRAVENOUS | Status: DC
  Administered 2019-03-30: 10 mg/h via INTRAVENOUS
  Administered 2019-03-30: 3 mg/h via INTRAVENOUS
  Administered 2019-03-30: 5 mg/h via INTRAVENOUS
  Administered 2019-03-31: 08:00:00 7.5 mg/h via INTRAVENOUS
  Administered 2019-03-31 (×2): 8 mg/h via INTRAVENOUS
  Administered 2019-03-31: 4 mg/h via INTRAVENOUS
  Administered 2019-03-31: 5 mg/h via INTRAVENOUS
  Administered 2019-03-31: 17:00:00 10 mg/h via INTRAVENOUS
  Administered 2019-03-31: 04:00:00 5 mg/h via INTRAVENOUS
  Administered 2019-04-01 (×2): 8 mg/h via INTRAVENOUS
  Administered 2019-04-01 (×2): 7 mg/h via INTRAVENOUS
  Filled 2019-03-30 (×16): qty 200

## 2019-03-30 MED ORDER — SODIUM CHLORIDE 0.9 % IV SOLN: 100.0000 mL | INTRAVENOUS | Status: DC | PRN

## 2019-03-30 MED ORDER — MIDAZOLAM HCL 2 MG/2ML IJ SOLN: 2.0000 mg | Freq: Once | INTRAMUSCULAR | Status: DC

## 2019-03-30 MED ORDER — HEPARIN SODIUM (PORCINE) 1000 UNIT/ML DIALYSIS
1000.0000 [IU] | INTRAMUSCULAR | Status: DC | PRN
  Filled 2019-03-30: qty 1

## 2019-03-30 MED ORDER — LIDOCAINE-PRILOCAINE 2.5-2.5 % EX CREA
1.0000 "application " | TOPICAL_CREAM | CUTANEOUS | Status: DC | PRN
  Filled 2019-03-30: qty 5

## 2019-03-30 MED ORDER — METOCLOPRAMIDE HCL 5 MG/ML IJ SOLN: 10.0000 mg | Freq: Once | INTRAMUSCULAR | Status: DC

## 2019-03-30 MED ORDER — PANTOPRAZOLE SODIUM 40 MG IV SOLR
40.0000 mg | Freq: Two times a day (BID) | INTRAVENOUS | Status: DC
  Administered 2019-03-30 – 2019-04-01 (×6): 40 mg via INTRAVENOUS
  Filled 2019-03-30 (×6): qty 40

## 2019-03-30 MED ORDER — FENTANYL CITRATE (PF) 100 MCG/2ML IJ SOLN
INTRAMUSCULAR | Status: AC
  Administered 2019-03-30: 18:00:00 50 ug
  Filled 2019-03-30: qty 2

## 2019-03-30 MED ORDER — PENTAFLUOROPROP-TETRAFLUOROETH EX AERO: 1.0000 "application " | INHALATION_SPRAY | CUTANEOUS | Status: DC | PRN

## 2019-03-30 MED ORDER — CHLORHEXIDINE GLUCONATE CLOTH 2 % EX PADS
6.0000 | MEDICATED_PAD | Freq: Every day | CUTANEOUS | Status: DC
  Administered 2019-03-30: 18:00:00 6 via TOPICAL

## 2019-03-30 MED ORDER — HEPARIN SODIUM (PORCINE) 1000 UNIT/ML DIALYSIS
1000.0000 [IU] | INTRAMUSCULAR | Status: DC | PRN
  Administered 2019-03-31: 2800 [IU] via INTRAVENOUS_CENTRAL
  Filled 2019-03-30 (×2): qty 1

## 2019-03-30 MED ORDER — LIDOCAINE HCL (PF) 1 % IJ SOLN: 5.0000 mL | INTRAMUSCULAR | Status: DC | PRN

## 2019-03-30 MED ORDER — SODIUM CHLORIDE 0.9% IV SOLUTION
Freq: Once | INTRAVENOUS | Status: AC
  Administered 2019-03-30: 03:00:00 via INTRAVENOUS

## 2019-03-30 MED ORDER — FENTANYL BOLUS VIA INFUSION
50.0000 ug | INTRAVENOUS | Status: DC | PRN
  Administered 2019-03-30 (×2): 50 ug via INTRAVENOUS
  Filled 2019-03-30: qty 50

## 2019-03-30 MED ORDER — DARBEPOETIN ALFA 100 MCG/0.5ML IJ SOSY
100.0000 ug | PREFILLED_SYRINGE | INTRAMUSCULAR | Status: DC
  Administered 2019-03-31: 15:00:00 100 ug via INTRAVENOUS
  Filled 2019-03-30: qty 0.5

## 2019-03-30 MED ORDER — ALTEPLASE 2 MG IJ SOLR
2.0000 mg | Freq: Once | INTRAMUSCULAR | Status: DC | PRN
  Filled 2019-03-30: qty 2

## 2019-03-30 NOTE — H&P (Addendum)
NAME:  [name], MRN:  [id number], DOB:  December 10, 1990, LOS: 1 ADMISSION DATE:  03/29/2019, CONSULTATION DATE:  03/29/2019 REFERRING MD:  [facility], CHIEF COMPLAINT:  HTN   Brief History   28 y.o male with CKD Stage V, malignant HTN, and prior CVA presented to an outside facility with hypertensive emergency (seizure, possibly ischemic colitis) and UGIB. Managed acutely with IV Clevidipine and emergent EGD. S/p 3 clips to a gastric ulcer but continued to have hematemesis and transferred to [facility] for further evaluation.   History of present illness   Presented to an outside hospital with 5 days of N/V and diffuse abdominal pain. Initially found to have a BP >200/100. While in the ED had witness seizure like activity. He was started on a Clevidine drip and admitted for further evaluation. Labs were significant for acute on chronic renal failure. CT of the head was unremarkable, followed by a CT abdomen that illustrated acute colitis. While hospitalized he then developed  Hematemesis and hematochezia. Subsequently went for EGD and sigmoidoscopy. EGD illustrated diffuse gastritis and a single gastric ulcer actively bleeding. S/p 3 clips. Sigmoidoscopy illustrated diffuse friable, erythematous, boggy tissue. Biopsies taken but the report is unavailable. GI pathogen panel including C. Diff, was negative but he was continued on IV metronidazole. The morning of transfer he continued to have diffuse abdominal pain with hematemesis and he was transferred for further evaluation.    In regards to the patient's renal failure he is baseline CKD Stage V that has since progressed with this acute event. He has a HD cath in the right IJ. Last session on 7/17.   Past Medical History  Malignant HTN  CKD Stage V CVA  Significant Hospital Events   7/20 OSH EGD, clipping cautery, epi 7/21 transferred here, intubated, bedside EGD, more clipping 7/22 iHD, run even  Consults:  GI  Nephrology   Procedures:   Right IJ HD cath 7/17 L IJ MML 7/21  Significant Diagnostic Tests:  Outside:  CT head > unremarkable  CT abdomen > diffuse colitis  EGD > diffuse gastritis and a single gastric ulcer actively bleeding Sigmoidoscopy > diffuse friable, erythematous, boggy tissue  Here: EGD multiple ulcers, one clipped again, no active bleeding, large clot in antrum incompletely suctioned  Micro Data:  GI pathogen panel negative  C. Diff negative  COVID negative   Antimicrobials:  None  Interim history/subjective:  Comfortable on vent today, undergoing HD.  Objective   Blood pressure (!) 134/93, pulse 96, temperature 98.6 F (37 C), temperature source Axillary, resp. rate 14, height 6\' 2"  (1.88 m), weight 92.7 kg, SpO2 95 %.    Vent Mode: PRVC FiO2 (%):  [50 %-100 %] 50 % Set Rate:  [14 bmp] 14 bmp Vt Set:  [650 mL] 650 mL PEEP:  [5 cmH20] 5 cmH20 Plateau Pressure:  [19 cmH20-23 cmH20] 19 cmH20   Intake/Output Summary (Last 24 hours) at 03/30/2019 0726 Last data filed at 03/30/2019 0600 Gross per 24 hour  Intake 4000.87 ml  Output 125 ml  Net 3875.87 ml   Filed Weights   03/29/19 1400 03/29/19 1500 03/30/19 0217  Weight: 92 kg 92 kg 92.7 kg    GEN: young man in NAD on vent HEENT: ETT in place with minimal secretions CV: RRR, ext warm PULM: CTAB, no accessory muscle use GI: Soft, hypoactive BS EXT: No edema NEURO: Very sedated but withdraws to pain PSYCH: deferred SKIN: no rashes, HD/MML CDI Rectal bag with oozing melena  Resolved Hospital  Problem list   None  Assessment & Plan:  # Hypertensive emergency- improved # Acute UGIB- improved after clipping and rescuscitation.  Total 5 pRBC and 3 FFP yesterday. # Acute on chronic hypertensive nephropathy- now HD dependent # Seizures- improved with BP control, started on keppra by OSH neuro # Colitis- question of ischemic raised at OSH, path pending # Intubated for airway protection- minimal settings on vent # Uremic  encephalopathy- worsened by bleed  - Goal to keep on vent until we assure H/H stable otherwise will need repeat EGD - Trend H/H - 1g calcium chloride to offset citrate load - iHD today, run even - BP control is currently achieved through sedation, will be tricky once extubated, likely will need CCB drip again - f/u path from OSH colitis biopsies, infectious workup was negative.  Will also try to get CT A/P report to see if any mentioned of significant msenteric vascular issues that may warrant OP eval.  Best practice:  Diet: NPO Pain/Anxiety/Delirium protocol (if indicated): Ordered VAP protocol (if indicated): Ordered DVT prophylaxis: SCDs GI prophylaxis: Protonix Glucose control: not needed Mobility: BR Code Status: Full Family Communication: updated today Disposition: ICU   The patient is critically ill with multiple organ systems failure and requires high complexity decision making for assessment and support, frequent evaluation and titration of therapies, application of advanced monitoring technologies and extensive interpretation of multiple databases. Critical Care Time devoted to patient care services described in this note independent of APP/resident time (if applicable)  is 31 minutes.   Erskine Emery MD [facility] 03/30/19 7:41 AM Personal pager: [phone] If unanswered, please page CCM On-call: [phone]

## 2019-03-30 NOTE — Progress Notes (Signed)
Pt w/ second episode of black emesis. Dr. Tamala Julian informed and order for placement of NGT for decompression recvd.

## 2019-03-30 NOTE — Progress Notes (Addendum)
Daily Rounding Note  03/30/2019, 8:20 AM  LOS: 1 day   SUBJECTIVE:   Chief complaint: Upper GI bleed.  Gastric/GE junction ulcer. Small amount of liquid burgundy stool overnight.  Flexi-Seal in place.  Remains intubated.  Hemodialysis in process.  Plan is for extubation midday.   OBJECTIVE:         Vital signs in last 24 hours:    Temp:  [97.7 F (36.5 C)-99.7 F (37.6 C)] 98.6 F (37 C) (07/22 0700) Pulse Rate:  [83-127] 88 (07/22 0815) Resp:  [9-29] 14 (07/22 0731) BP: (95-214)/(55-145) 118/76 (07/22 0815) SpO2:  [95 %-100 %] 96 % (07/22 0731) FiO2 (%):  [50 %-100 %] 50 % (07/22 0731) Weight:  [92 kg-92.7 kg] 92.7 kg (07/22 0217) Last BM Date: 03/30/19 Filed Weights   03/29/19 1400 03/29/19 1500 03/30/19 0217  Weight: 92 kg 92 kg 92.7 kg   General: Intubated, unable to awaken. Heart: RRR. Chest: No labored breathing.  On vent. Abdomen: Soft.  Not tender or distended.  No HSM, masses, bruits, hernias. Extremities: Slight edema without pitting more pronounced in the arms than the legs. Neuro/Psych: Unresponsive to non-vigorous attempts to wake him up.  Intake/Output from previous day: 07/21 0701 - 07/22 0700 In: 4000.9 [P.O.:120; I.V.:797.8; Blood:2685.7; IV Piggyback:397.4] Out: 125 [Stool:125]  Intake/Output this shift: No intake/output data recorded.  Lab Results: Recent Labs    03/29/19 1135 03/29/19 1243  03/29/19 2032 03/30/19 0136 03/30/19 0611  WBC 14.5* 12.1*  --   --   --  13.9*  HGB 4.8* 4.3*   < > 7.2* 6.9* 8.3*  HCT 13.8* 12.7*   < > 20.7* 19.8* 24.3*  PLT 148* 121*  --   --   --  101*   < > = values in this interval not displayed.   BMET Recent Labs    03/29/19 1135 03/29/19 1837  NA 131* 130*  K 4.9 4.6  CL 100  --   CO2 14*  --   GLUCOSE 118*  --   BUN 143*  --   CREATININE 21.61*  --   CALCIUM 6.7*  --    LFT Recent Labs    03/29/19 1135  PROT 3.6*  ALBUMIN 1.9*   AST 20  ALT 30  ALKPHOS 57  BILITOT 0.7   PT/INR Recent Labs    03/29/19 1321  LABPROT 16.6*  INR 1.4*   Hepatitis Panel No results for input(s): HEPBSAG, HCVAB, HEPAIGM, HEPBIGM in the last 72 hours.  Studies/Results: Dg Chest Port 1 View  Result Date: 03/29/2019 CLINICAL DATA:  Status post intubation EXAM: PORTABLE CHEST 1 VIEW COMPARISON:  03/29/2019 FINDINGS: Endotracheal tube is now seen 17 mm above the carina. Left and right jugular central lines are noted just below the cavoatrial junction. No pneumothorax is seen. Right-sided pleural effusion is noted and stable. Cardiomegaly is again seen. IMPRESSION: Endotracheal tube in place just above the carina. Bilateral jugular central lines are noted. Stable right pleural effusion. Electronically Signed   By: Inez Catalina M.D.   On: 03/29/2019 15:51   Dg Chest Port 1 View  Result Date: 03/29/2019 CLINICAL DATA:  Post LEFT CVL placement. EXAM: PORTABLE CHEST 1 VIEW COMPARISON:  Chest x-ray dated 07/28/2017. FINDINGS: LEFT IJ central line appears well position with tip overlying the RIGHT atrium. RIGHT-sided central line also in place with tip overlying the RIGHT atrium. No pneumothorax seen. Probable atelectasis and/or small pleural effusion at the  RIGHT lung base. IMPRESSION: 1. LEFT IJ central line appears well positioned with tip overlying the RIGHT atrium. No pneumothorax seen. 2. Probable atelectasis and/or small pleural effusion at the RIGHT lung base. Electronically Signed   By: Franki Cabot M.D.   On: 03/29/2019 14:38   Scheduled Meds: . amLODipine  10 mg Oral Once  . chlorhexidine  15 mL Mouth Rinse BID  . Chlorhexidine Gluconate Cloth  6 each Topical Q0600  . labetalol  10 mg Intravenous Q8H  . mouth rinse  15 mL Mouth Rinse q12n4p  . pantoprazole (PROTONIX) IV  40 mg Intravenous Q12H   Continuous Infusions: . fentaNYL infusion INTRAVENOUS 200 mcg/hr (03/30/19 0600)  . levETIRAcetam Stopped (03/30/19 0043)  . niCARDipine     . propofol (DIPRIVAN) infusion 70 mcg/kg/min (03/30/19 0620)   PRN Meds:.acetaminophen, fentaNYL, hydrALAZINE, ondansetron (ZOFRAN) IV   ASSESMENT:   *   UGIB with hematemesis, burgundy blood stools EGD x 2 #1 in [place] 7/20 with clipping/cautery/epi of ulcer at [place] Jx/cardia #2 7/21 with non-bleeding esoph ulcer, clipped.  Blood in fundus.  Non-bleeding, 57mm, GU with pigmented material.  Duodenum normal.   Protonix 40 mg IV BID in place, completed PPI drip.   Hematemesis resolved.  Slowing of burgundy BM's.    *   Blood loss anemia.  2 PRBCs in [place], 5 in [place].  Hgb 4.3 >> 8.3.   Given ESRD and lack of medical fup, suspect may have element of anemia of chronic kidney dz.    *   Mild coagulopathy, received 3 FFP.    *  Uncontrolled htn  *   New ESRD.  Has started HD.    *   Thrombocytopenia.  No mention of liver abnormality from notes re CT in [place] but do not have actual CT report.     PLAN   *   Repeat EGD if further bleeding.  Continue IV BID Protonix.  Ok to feed clears once extubated.          Azucena Freed  03/30/2019, 8:20 AM Phone [phone]   Attending physician's note   I have taken an interval history, reviewed the chart and examined the patient. I agree with the Advanced Practitioner's note, impression and recommendations.   Status post 5 unit PRBC, hgb 4.3 ---> 8.3   EGD bedside yesterday, adherent clot at EG junction ulcer, intact clip from previous procedure.  2 additional hemostatic clips placed.  No bleeding end of procedure Large clots in gastric fundus, unable to clear with limited visualization of gastric cardia and fundus  We will plan to repeat EGD if has signs of ongoing active GI bleed  Continue PPI IV  Supportive care per ICU team  Damaris Hippo , MD [phone]

## 2019-03-30 NOTE — Progress Notes (Signed)
OSH Path received  Stomach: gastritis, no Helicobacter Colon: nonspecific chronic inflammation

## 2019-03-30 NOTE — Procedures (Signed)
Extubation Procedure Note  Patient Details:   Name: [name] DOB: 11-23-1990 MRN: [id number]   Airway Documentation:    Vent end date: 03/30/19 Vent end time: 1535   Evaluation  O2 sats: stable throughout Complications: No apparent complications Patient did tolerate procedure well. Bilateral Breath Sounds: Clear, Diminished   Yes   Pt extubated to 3LNC, tolerated well. There was positive cuff leak, no stridor noted and pt is able to state name. RN at bedside.  Jorje Guild 03/30/2019, 1535

## 2019-03-30 NOTE — Progress Notes (Signed)
Pt received dialysis today and will be extubated this afternoon. Will do EEG tomorrow when schedule permits

## 2019-03-30 NOTE — Progress Notes (Signed)
Admit: 03/29/2019 LOS: 1  57M with severe renal failure of unclear chronicity (Had SCr 1.4 11/2017) and severe anemia with gastrointestinal hemorrhage  Subjective:  . EGD clipped esophageal ulcer; gastric ulcer also noted . Intubated for procedure . 5u PRBC, 3u FFP yesterday  . BP Stable . Seen on HD: No UF, Qb 250, 2K, tolerating well  07/21 0701 - 07/22 0700 In: 4000.9 [P.O.:120; I.V.:797.8; Blood:2685.7; IV Piggyback:397.4] Out: 125 [Stool:125]  Filed Weights   03/29/19 1400 03/29/19 1500 03/30/19 0217  Weight: 92 kg 92 kg 92.7 kg    Scheduled Meds: . amLODipine  10 mg Oral Once  . chlorhexidine  15 mL Mouth Rinse BID  . Chlorhexidine Gluconate Cloth  6 each Topical Q0600  . labetalol  10 mg Intravenous Q8H  . mouth rinse  15 mL Mouth Rinse q12n4p  . pantoprazole (PROTONIX) IV  40 mg Intravenous Q12H   Continuous Infusions: . fentaNYL infusion INTRAVENOUS 200 mcg/hr (03/30/19 0600)  . levETIRAcetam Stopped (03/30/19 0043)  . niCARDipine    . propofol (DIPRIVAN) infusion 70 mcg/kg/min (03/30/19 0620)   PRN Meds:.acetaminophen, fentaNYL, hydrALAZINE, ondansetron (ZOFRAN) IV  Current Labs: reviewed    Physical Exam:  Blood pressure 118/76, pulse 88, temperature 98.6 F (37 C), temperature source Axillary, resp. rate 14, height 6\' 2"  (1.88 m), weight 92.7 kg, SpO2 96 %. GEN:  intubated, sedated ENT: NCAT ETT in place EYES: EOMI CV: RRR, normal S1 and S2 PULM: Anterior auscultation, clear ABD: Mild diffuse tenderness, no rebound or guarding SKIN: No rashes or lesions, right IJ tunneled dialysis catheter bandaged with no erythema or purulence EXT: No peripheral edema  A 1. Renal failure, unclear chronicity: Started HD at outside facility; significant azotemia related to renal failure and GI hemorrhage; has R IJ TDC; Likely ESRD but did have fairly normal GFR 11/2017 2. UGIB and LGIB, EGD 7/21 with esophageal ulcer, clipped, gastric ulcer; GI following 3. ABLA Severe,  Hb up 8.3 this AM 4. HTN, stable currently  P . HD today going wel2 . HD again tomorrow, incremental: 3h, Qb 300, 2K, 1L UF, No heparin . Start Aranesp . Will need permanent access and TDC when more stable . Will need to CLIP . Check PTH and Phos . Medication Issues; o Preferred narcotic agents for pain control are hydromorphone, fentanyl, and methadone. Morphine should not be used.  o Baclofen should be avoided o Avoid oral sodium phosphate and magnesium citrate based laxatives / bowel preps    Pearson Grippe MD 03/30/2019, 8:37 AM  Recent Labs  Lab 03/29/19 1135 03/29/19 1837  NA 131* 130*  K 4.9 4.6  CL 100  --   CO2 14*  --   GLUCOSE 118*  --   BUN 143*  --   CREATININE 21.61*  --   CALCIUM 6.7*  --    Recent Labs  Lab 03/29/19 1135 03/29/19 1243  03/29/19 2032 03/30/19 0136 03/30/19 0611  WBC 14.5* 12.1*  --   --   --  13.9*  NEUTROABS 13.8*  --   --   --   --   --   HGB 4.8* 4.3*   < > 7.2* 6.9* 8.3*  HCT 13.8* 12.7*   < > 20.7* 19.8* 24.3*  MCV 87.9 88.8  --   --   --  90.7  PLT 148* 121*  --   --   --  101*   < > = values in this interval not displayed.

## 2019-03-31 ENCOUNTER — Inpatient Hospital Stay (HOSPITAL_COMMUNITY): Payer: Medicaid Other

## 2019-03-31 DIAGNOSIS — R569 Unspecified convulsions: Secondary | ICD-10-CM

## 2019-03-31 LAB — HEPATITIS B SURFACE ANTIBODY,QUALITATIVE: Hep B S Ab: REACTIVE

## 2019-03-31 LAB — HEMOGLOBIN AND HEMATOCRIT, BLOOD
HCT: 24.2 % — ABNORMAL LOW (ref 39.0–52.0)
Hemoglobin: 8.2 g/dL — ABNORMAL LOW (ref 13.0–17.0)

## 2019-03-31 LAB — BPAM RBC
Blood Product Expiration Date: 202008052359
Blood Product Expiration Date: 202008062359
Blood Product Expiration Date: 202008122359
Blood Product Expiration Date: 202008162359
Blood Product Expiration Date: 202008172359
ISSUE DATE / TIME: 202007211218
ISSUE DATE / TIME: 202007211218
ISSUE DATE / TIME: 202007211616
ISSUE DATE / TIME: 202007211616
ISSUE DATE / TIME: 202007220311
Unit Type and Rh: 5100
Unit Type and Rh: 5100
Unit Type and Rh: 6200
Unit Type and Rh: 6200
Unit Type and Rh: 6200

## 2019-03-31 LAB — TYPE AND SCREEN
ABO/RH(D): A POS
Antibody Screen: NEGATIVE
Unit division: 0
Unit division: 0
Unit division: 0
Unit division: 0
Unit division: 0

## 2019-03-31 LAB — CBC
HCT: 24.4 % — ABNORMAL LOW (ref 39.0–52.0)
Hemoglobin: 8.3 g/dL — ABNORMAL LOW (ref 13.0–17.0)
MCH: 30.5 pg (ref 26.0–34.0)
MCHC: 34 g/dL (ref 30.0–36.0)
MCV: 89.7 fL (ref 80.0–100.0)
Platelets: 131 10*3/uL — ABNORMAL LOW (ref 150–400)
RBC: 2.72 MIL/uL — ABNORMAL LOW (ref 4.22–5.81)
RDW: 15.2 % (ref 11.5–15.5)
WBC: 16.4 10*3/uL — ABNORMAL HIGH (ref 4.0–10.5)
nRBC: 0.2 % (ref 0.0–0.2)

## 2019-03-31 LAB — BASIC METABOLIC PANEL
Anion gap: 21 — ABNORMAL HIGH (ref 5–15)
BUN: 101 mg/dL — ABNORMAL HIGH (ref 6–20)
CO2: 17 mmol/L — ABNORMAL LOW (ref 22–32)
Calcium: 7.7 mg/dL — ABNORMAL LOW (ref 8.9–10.3)
Chloride: 98 mmol/L (ref 98–111)
Creatinine, Ser: 17.91 mg/dL — ABNORMAL HIGH (ref 0.61–1.24)
GFR calc Af Amer: 4 mL/min — ABNORMAL LOW (ref >60)
GFR calc non Af Amer: 3 mL/min — ABNORMAL LOW (ref >60)
Glucose, Bld: 87 mg/dL (ref 70–99)
Potassium: 4.6 mmol/L (ref 3.5–5.1)
Sodium: 136 mmol/L (ref 135–145)

## 2019-03-31 LAB — GLUCOSE, CAPILLARY
Glucose-Capillary: 81 mg/dL (ref 70–99)
Glucose-Capillary: 86 mg/dL (ref 70–99)
Glucose-Capillary: 92 mg/dL (ref 70–99)

## 2019-03-31 LAB — HEPATITIS B SURFACE ANTIGEN: Hepatitis B Surface Ag: NEGATIVE

## 2019-03-31 LAB — HEPATITIS B CORE ANTIBODY, TOTAL: Hep B Core Total Ab: NEGATIVE

## 2019-03-31 LAB — PHOSPHORUS: Phosphorus: 10.6 mg/dL — ABNORMAL HIGH (ref 2.5–4.6)

## 2019-03-31 MED ORDER — LORAZEPAM 2 MG/ML IJ SOLN
1.0000 mg | Freq: Once | INTRAMUSCULAR | Status: AC
  Administered 2019-03-31: 1 mg via INTRAVENOUS
  Filled 2019-03-31 (×2): qty 1

## 2019-03-31 MED ORDER — HEPARIN SODIUM (PORCINE) 1000 UNIT/ML IJ SOLN
INTRAMUSCULAR | Status: AC
  Administered 2019-03-31: 15:00:00 2800 [IU] via INTRAVENOUS_CENTRAL
  Filled 2019-03-31: qty 3

## 2019-03-31 MED ORDER — DARBEPOETIN ALFA 100 MCG/0.5ML IJ SOSY
PREFILLED_SYRINGE | INTRAMUSCULAR | Status: AC
  Administered 2019-03-31: 15:00:00 100 ug via INTRAVENOUS
  Filled 2019-03-31: qty 0.5

## 2019-03-31 NOTE — Progress Notes (Signed)
Admit: 03/29/2019 LOS: 2  84M with severe renal failure of unclear chronicity (Had SCr 1.4 11/2017) and severe anemia with gastrointestinal hemorrhage  Subjective:  . HD yesterday: tolerated well.  . Extubated . No nicardipine gtt . Started amlodipine . Hb stable in 8s . Low grade fevers  07/22 0701 - 07/23 0700 In: 1623.5 [I.V.:1423.5; IV Piggyback:200] Out: 1610 [Emesis/NG output:800; Stool:225]  Filed Weights   03/30/19 0217 03/30/19 0946 03/31/19 0421  Weight: 92.7 kg 93.4 kg 92.2 kg    Scheduled Meds: . amLODipine  10 mg Oral Once  . chlorhexidine  15 mL Mouth Rinse BID  . Chlorhexidine Gluconate Cloth  6 each Topical Q0600  . darbepoetin (ARANESP) injection - DIALYSIS  100 mcg Intravenous Q Thu-HD  . labetalol  10 mg Intravenous Q8H  . mouth rinse  15 mL Mouth Rinse q12n4p  . metoCLOPramide (REGLAN) injection  10 mg Intravenous Once  . pantoprazole (PROTONIX) IV  40 mg Intravenous Q12H   Continuous Infusions: . sodium chloride    . sodium chloride    . fentaNYL infusion INTRAVENOUS Stopped (03/30/19 1510)  . levETIRAcetam Stopped (03/31/19 0031)  . niCARDipine 5 mg/hr (03/31/19 0600)  . propofol (DIPRIVAN) infusion Stopped (03/30/19 1510)   PRN Meds:.sodium chloride, sodium chloride, acetaminophen, alteplase, fentaNYL, heparin, heparin, hydrALAZINE, lidocaine (PF), lidocaine-prilocaine, ondansetron (ZOFRAN) IV, pentafluoroprop-tetrafluoroeth  Current Labs: reviewed    Physical Exam:  Blood pressure (!) 156/97, pulse (!) 127, temperature (!) 100.4 F (38 C), temperature source Axillary, resp. rate 17, height 6\' 2"  (1.88 m), weight 92.2 kg, SpO2 96 %. GEN:  intubated, sedated ENT: NCAT ETT in place EYES: EOMI CV: RRR, normal S1 and S2 PULM: Anterior auscultation, clear ABD: Mild diffuse tenderness, no rebound or guarding SKIN: No rashes or lesions, right IJ tunneled dialysis catheter bandaged with no erythema or purulence EXT: No peripheral  edema  A 1. Renal failure, unclear chronicity: Started HD at outside facility; significant azotemia related to renal failure and GI hemorrhage; has R IJ TDC; Likely ESRD but did have fairly normal GFR 11/2017 2. UGIB and LGIB, EGD 7/21 with esophageal ulcer, clipped, gastric ulcer; GI following 3. ABLA Severe, stable; Hb up 8.3 this AM 4. HTN, on nicardipine gtt; starting oral meds 5. CKD BMD: P 7.3, Follow for now, no binders  P  . HD again today: 3h, Qb 300, 2K, 2L UF, No heparin . Will need permanent access, TDC; hold on VVS consult given low grade fevers overnight . Start carvedilol 12.5 BID . Ween nicardipine . Will start CLIP . Check PTH . Medication Issues; o Preferred narcotic agents for pain control are hydromorphone, fentanyl, and methadone. Morphine should not be used.  o Baclofen should be avoided o Avoid oral sodium phosphate and magnesium citrate based laxatives / bowel preps    Pearson Grippe MD 03/31/2019, 8:05 AM  Recent Labs  Lab 03/29/19 1135 03/29/19 1837 03/30/19 0807 03/31/19 0350  NA 131* 130* 135 136  K 4.9 4.6 3.6 4.6  CL 100  --  101 98  CO2 14*  --  19* 17*  GLUCOSE 118*  --  86 87  BUN 143*  --  118* 101*  CREATININE 21.61*  --  17.19* 17.91*  CALCIUM 6.7*  --  7.3* 7.7*  PHOS  --   --  7.3*  --    Recent Labs  Lab 03/29/19 1135 03/29/19 1243  03/30/19 0611  03/30/19 1700 03/30/19 2339 03/31/19 0350  WBC 14.5* 12.1*  --  13.9*  --   --   --  16.4*  NEUTROABS 13.8*  --   --   --   --   --   --   --   HGB 4.8* 4.3*   < > 8.3*   < > 8.8* 8.2* 8.3*  HCT 13.8* 12.7*   < > 24.3*   < > 25.5* 24.2* 24.4*  MCV 87.9 88.8  --  90.7  --   --   --  89.7  PLT 148* 121*  --  101*  --   --   --  131*   < > = values in this interval not displayed.

## 2019-03-31 NOTE — Progress Notes (Signed)
NAME:  [name], MRN:  [id number], DOB:  [birth year], LOS: 2 ADMISSION DATE:  03/29/2019, CONSULTATION DATE:  03/29/2019 REFERRING MD:  [facility], CHIEF COMPLAINT:  HTN   Brief History   28 y.o male with CKD Stage V, malignant HTN, and prior CVA presented to an outside facility with hypertensive emergency (seizure, possibly ischemic colitis) and UGIB. Managed acutely with IV Clevidipine and emergent EGD. S/p 3 clips to a gastric ulcer but continued to have hematemesis and transferred to [facility] for further evaluation.   History of present illness   Presented to an outside hospital with 5 days of N/V and diffuse abdominal pain. Initially found to have a BP >200/100. While in the ED had witness seizure like activity. He was started on a Clevidine drip and admitted for further evaluation. Labs were significant for acute on chronic renal failure. CT of the head was unremarkable, followed by a CT abdomen that illustrated acute colitis. While hospitalized he then developed  Hematemesis and hematochezia. Subsequently went for EGD and sigmoidoscopy. EGD illustrated diffuse gastritis and a single gastric ulcer actively bleeding. S/p 3 clips. Sigmoidoscopy illustrated diffuse friable, erythematous, boggy tissue. Biopsies taken but the report is unavailable. GI pathogen panel including C. Diff, was negative but he was continued on IV metronidazole. The morning of transfer he continued to have diffuse abdominal pain with hematemesis and he was transferred for further evaluation.    In regards to the patient's renal failure he is baseline CKD Stage V that has since progressed with this acute event. He has a HD cath in the right IJ. Last session on 7/17.   Past Medical History  Malignant HTN  CKD Stage V CVA  Significant Hospital Events   7/20 OSH EGD, clipping cautery, epi 7/21 transferred here, intubated, bedside EGD, more clipping 7/22 iHD, run even  Consults:  GI  Nephrology   Procedures:   Right IJ HD cath 7/17 L IJ MML 7/21  Significant Diagnostic Tests:  Outside:  CT head > unremarkable  CT abdomen > diffuse colitis  EGD > diffuse gastritis and a single gastric ulcer actively bleeding Sigmoidoscopy > diffuse friable, erythematous, boggy tissue  Here: EGD multiple ulcers, one clipped again, no active bleeding, large clot in antrum incompletely suctioned  Micro Data:  GI pathogen panel negative  C. Diff negative  COVID negative   Antimicrobials:  None  Interim history/subjective:  Had some N/V last night, NGT decompression helped as did a dose of reglan.  HR/BP remain elevated, some low grade temps.  Objective   Blood pressure (!) 156/97, pulse (!) 127, temperature (!) 100.4 F (38 C), temperature source Axillary, resp. rate 17, height 6\' 2"  (1.88 m), weight 92.2 kg, SpO2 96 %.    Vent Mode: CPAP;PSV FiO2 (%):  [50 %] 50 % Set Rate:  [14 bmp] 14 bmp Vt Set:  [650 mL] 650 mL PEEP:  [5 cmH20] 5 cmH20 Pressure Support:  [12 cmH20] 12 cmH20 Plateau Pressure:  [15 cmH20] 15 cmH20   Intake/Output Summary (Last 24 hours) at 03/31/2019 0818 Last data filed at 03/31/2019 0600 Gross per 24 hour  Intake 1493.83 ml  Output 1025 ml  Net 468.83 ml   Filed Weights   03/30/19 0217 03/30/19 0946 03/31/19 0421  Weight: 92.7 kg 93.4 kg 92.2 kg    GEN: young man in NAD HEENT: NRB in place, trachea midline CV: Regular rhythm, tachycardic, ext warm PULM: Poor inspiratory effort, no accessory muscle use GI: Soft, hypoactive BS EXT:  No edema NEURO: follows commands briskly PSYCH: oriented but anxious SKIN: no rashes, HD/MML CDI  Resolved Hospital Problem list   None  Assessment & Plan:  # Hypertensive emergency- improved # Acute UGIB- improved/stable after clipping and rescuscitation 7/21.  Total 5 pRBC and 3 FFP. # Acute on chronic hypertensive nephropathy- now HD dependent # Seizures- improved with BP control, started on keppra by OSH neuro # Colitis-  question of ischemic raised at OSH, path nonspecific # Hypoxemia- brief stint on vent 7/21-7/22 for EGD. Now extubated but issues with atelectasis and pulmonary toileting. # Uremic encephalopathy- worsened by bleed, still an issue but better today # Anxiety- not helping vitals  - Protonix BID, daily H/H - iHD today, hopefully run negative, appreciate nephro input, permanent access at some point - After HD, swallow screen and hopefully start liquid diet - Labetalol standing ordered, CCB drip PRN, restart PO meds once confident can take PO safely - OOB, PT consult - Continue keppra for now - OP f/u for questionable ischemic colitis - Can probably go to progressive after HD today  Best practice:  Diet: NPO Pain/Anxiety/Delirium protocol (if indicated): will give a trial of low dose benzo today to help nausea and anxiety VAP protocol (if indicated): NA DVT prophylaxis: SCDs GI prophylaxis: Protonix Glucose control: not needed Mobility: OOB to chair, PT Code Status: Full Family Communication: updated yesterday, updated patient today Disposition: progressive after HD if doing okay   Erskine Emery MD [facility] 03/31/19 8:18 AM Personal pager: [phone] If unanswered, please page CCM On-call: [phone]

## 2019-03-31 NOTE — Progress Notes (Signed)
EEG complete - results pending 

## 2019-03-31 NOTE — Progress Notes (Addendum)
Daily Rounding Note  03/31/2019, 11:43 AM  LOS: 2 days   SUBJECTIVE:   Chief complaint: bleedin ulcer    Extubated ~ 330 PM yesterday. ~ 6 PM had black, burgundy emesis.  Suctioned about 400 cc total of fluid, most of this was greenish/dark/bilious rather than burgundy/bloody flexiseal output scant: burgundy/bloody Pt has been tachy and less responsive.  Just placed on cpap.   Creatinine is 17 plus, for HD session this afternoon.    OBJECTIVE:         Vital signs in last 24 hours:    Temp:  [98.4 F (36.9 C)-100.4 F (38 C)] 99.8 F (37.7 C) (07/23 0800) Pulse Rate:  [105-132] 127 (07/23 1100) Resp:  [12-30] 21 (07/23 1100) BP: (143-163)/(83-120) 154/87 (07/23 1100) SpO2:  [85 %-100 %] 86 % (07/23 1100) FiO2 (%):  [50 %-100 %] 100 % (07/23 0800) Weight:  [92.2 kg] 92.2 kg (07/23 0421) Last BM Date: 03/30/19 Filed Weights   03/30/19 0217 03/30/19 0946 03/31/19 0421  Weight: 92.7 kg 93.4 kg 92.2 kg   General: somnolent, does open eyes to name   Heart: tachy in 120s, 130s.  regular Chest: breathing quietly with CPAP mask in place.   Abdomen: soft, NT.  BS hypoactive.  ND  Extremities: non-pitting mild LE edema Neuro/Psych:  Somnolent responsive to voice.    Intake/Output from previous day: 07/22 0701 - 07/23 0700 In: 1623.5 [I.V.:1423.5; IV Piggyback:200] Out: 1025 [Emesis/NG output:800; Stool:225]  Intake/Output this shift: Total I/O In: 353.6 [I.V.:353.6] Out: -   Lab Results: Recent Labs    03/29/19 1243  03/30/19 0611  03/30/19 1700 03/30/19 2339 03/31/19 0350  WBC 12.1*  --  13.9*  --   --   --  16.4*  HGB 4.3*   < > 8.3*   < > 8.8* 8.2* 8.3*  HCT 12.7*   < > 24.3*   < > 25.5* 24.2* 24.4*  PLT 121*  --  101*  --   --   --  131*   < > = values in this interval not displayed.   BMET Recent Labs    03/29/19 1135 03/29/19 1837 03/30/19 0807 03/31/19 0350  NA 131* 130* 135 136  K 4.9 4.6 3.6  4.6  CL 100  --  101 98  CO2 14*  --  19* 17*  GLUCOSE 118*  --  86 87  BUN 143*  --  118* 101*  CREATININE 21.61*  --  17.19* 17.91*  CALCIUM 6.7*  --  7.3* 7.7*   LFT Recent Labs    03/29/19 1135 03/30/19 0807  PROT 3.6*  --   ALBUMIN 1.9* 2.5*  AST 20  --   ALT 30  --   ALKPHOS 57  --   BILITOT 0.7  --    PT/INR Recent Labs    03/29/19 1321  LABPROT 16.6*  INR 1.4*   Hepatitis Panel Recent Labs    03/30/19 0806  HEPBSAG Negative    Studies/Results: Dg Chest 1 View  Result Date: 03/31/2019 CLINICAL DATA:  Acute respiratory failure with hypoxia EXAM: CHEST  1 VIEW COMPARISON:  03/29/2019 FINDINGS: Interval extubation. Support devices otherwise are stable. Very low lung volumes with increasing bilateral airspace disease which could reflect edema or infection. Suspect bilateral effusions. IMPRESSION: Interval extubation. Very low lung volumes with increasing diffuse bilateral airspace disease, edema versus infection. Suspect bilateral effusions. Electronically Signed   By: Rolm Baptise  M.D.   On: 03/31/2019 08:23   Dg Chest Port 1 View  Result Date: 03/29/2019 CLINICAL DATA:  Status post intubation EXAM: PORTABLE CHEST 1 VIEW COMPARISON:  03/29/2019 FINDINGS: Endotracheal tube is now seen 17 mm above the carina. Left and right jugular central lines are noted just below the cavoatrial junction. No pneumothorax is seen. Right-sided pleural effusion is noted and stable. Cardiomegaly is again seen. IMPRESSION: Endotracheal tube in place just above the carina. Bilateral jugular central lines are noted. Stable right pleural effusion. Electronically Signed   By: Inez Catalina M.D.   On: 03/29/2019 15:51   Dg Chest Port 1 View  Result Date: 03/29/2019 CLINICAL DATA:  Post LEFT CVL placement. EXAM: PORTABLE CHEST 1 VIEW COMPARISON:  Chest x-ray dated 07/28/2017. FINDINGS: LEFT IJ central line appears well position with tip overlying the RIGHT atrium. RIGHT-sided central line also in  place with tip overlying the RIGHT atrium. No pneumothorax seen. Probable atelectasis and/or small pleural effusion at the RIGHT lung base. IMPRESSION: 1. LEFT IJ central line appears well positioned with tip overlying the RIGHT atrium. No pneumothorax seen. 2. Probable atelectasis and/or small pleural effusion at the RIGHT lung base. Electronically Signed   By: Franki Cabot M.D.   On: 03/29/2019 14:38   Dg Abd Portable 1v  Result Date: 03/30/2019 CLINICAL DATA:  NG tube placement. EXAM: PORTABLE ABDOMEN - 1 VIEW COMPARISON:  None. FINDINGS: NG tube tip is in the region of the mid stomach. Surgical clips in the left upper quadrant. Consolidation in the left lower lobe. Small bilateral pleural effusions. Two central lines in place, 1 at the level of the carina and the other at the level of the cavoatrial junction. Minimal air in nondistended bowel. IMPRESSION: NG tube tip in the mid stomach. Progressive consolidation at the left lung base.  Effusions. Electronically Signed   By: Lorriane Shire M.D.   On: 03/30/2019 20:05   Scheduled Meds: . amLODipine  10 mg Oral Once  . chlorhexidine  15 mL Mouth Rinse BID  . Chlorhexidine Gluconate Cloth  6 each Topical Q0600  . darbepoetin (ARANESP) injection - DIALYSIS  100 mcg Intravenous Q Thu-HD  . labetalol  10 mg Intravenous Q8H  . mouth rinse  15 mL Mouth Rinse q12n4p  . metoCLOPramide (REGLAN) injection  10 mg Intravenous Once  . pantoprazole (PROTONIX) IV  40 mg Intravenous Q12H   Continuous Infusions: . sodium chloride    . sodium chloride    . fentaNYL infusion INTRAVENOUS Stopped (03/30/19 1510)  . levETIRAcetam Stopped (03/31/19 0031)  . niCARDipine 7.5 mg/hr (03/31/19 1100)   PRN Meds:.sodium chloride, sodium chloride, acetaminophen, alteplase, fentaNYL, heparin, heparin, hydrALAZINE, lidocaine (PF), lidocaine-prilocaine, ondansetron (ZOFRAN) IV, pentafluoroprop-tetrafluoroeth  ASSESMENT:   *   UGIB with hematemesis, burgundy blood stools  EGD x 2 #1 in [place] 7/20 with clipping/cautery/epi of ulcer at [place] Jx/cardia #2 7/21 with non-bleeding esoph ulcer, clipped.  Blood in fundus.  Non-bleeding, 22mm, GU with pigmented material.  Duodenum normal.   Protonix 40 mg IV BID in place, completed PPI drip.   Hematemesis resolved.  Slowing of burgundy BM's.    *   Blood loss anemia.  2 PRBCs in [place], 5 in [place] 7/20 - 7/21.  Hgb 4.3 >> 8.3.   Given ESRD and lack of medical fup, suspect may have element of anemia of chronic kidney dz.    *   Mild coagulopathy, received 3 FFP 7/21.    *  Uncontrolled htn  *  New ESRD.  Has started HD.    *   Thrombocytopenia.  Improved.     PLAN   *   Continue Protonix 40 IV BID. NPO in setting of CPAP    Azucena Freed  03/31/2019, 11:43 AM Phone [phone]   Attending physician's note   I have taken an interval history, reviewed the chart and examined the patient. I agree with the Advanced Practitioner's note, impression and recommendations.   Hemoglobin remained stable Extubated Advance diet as tolerated Continue PPI twice daily  Follow-up with outpatient GI Plan for repeat EGD in 4 to 6 weeks to document healing of gastroesophageal and gastric antral ulcers  We will sign off, available if have any questions  K. Denzil Magnuson , MD [phone]

## 2019-03-31 NOTE — Procedures (Addendum)
Patient Name: [name]  MRN: [id number]  Epilepsy Attending: Lora Havens  Referring Physician/Provider: Dr Ina Homes, MD Date: 03/31/19  Duration: 26 mins  Patient history: 28 y.o male with CKD Stage V, malignant HTN, and prior CVA presented to an outside facility with hypertensive emergency, seizure, possibly ischemic colitis and UGIB. EEG ordered to evaluate  Level of alertness: drowsy, lethargic  AEDs during EEG study: LEV  Technical aspects: This EEG study was done with scalp electrodes positioned according to the 10-20 International system of electrode placement. Electrical activity was reviewed with a high frequency filter of 70Hz  and a low frequency filter of 1Hz . EEG data were recorded continuously and digitally stored.   BACKGROUND ACTIVITY:  Slowing: Diffuse high amplitude delta slowing with triphasic morphology which waxes and wanes  EPILEPTIFORM ACTIVITY: Interictal epileptiform activity: None  Ictal Activity: None  OTHER EVENTS: None  ACTIVATION PROCEDURES:  Hyperventilation and photic stimulation were not performed.  IMPRESSION: This study showed moderate diffuse encephalopathy likely secondary to metabolic etiology.  No seizures or epileptiform discharges were seen throughout the recording.  Cavin Longman Barbra Sarks

## 2019-04-01 LAB — BASIC METABOLIC PANEL
Anion gap: 15 (ref 5–15)
BUN: 64 mg/dL — ABNORMAL HIGH (ref 6–20)
CO2: 19 mmol/L — ABNORMAL LOW (ref 22–32)
Calcium: 7.7 mg/dL — ABNORMAL LOW (ref 8.9–10.3)
Chloride: 99 mmol/L (ref 98–111)
Creatinine, Ser: 13.16 mg/dL — ABNORMAL HIGH (ref 0.61–1.24)
GFR calc Af Amer: 5 mL/min — ABNORMAL LOW (ref >60)
GFR calc non Af Amer: 5 mL/min — ABNORMAL LOW (ref >60)
Glucose, Bld: 91 mg/dL (ref 70–99)
Potassium: 3.8 mmol/L (ref 3.5–5.1)
Sodium: 133 mmol/L — ABNORMAL LOW (ref 135–145)

## 2019-04-01 LAB — CBC
HCT: 22 % — ABNORMAL LOW (ref 39.0–52.0)
Hemoglobin: 7.4 g/dL — ABNORMAL LOW (ref 13.0–17.0)
MCH: 30.5 pg (ref 26.0–34.0)
MCHC: 33.6 g/dL (ref 30.0–36.0)
MCV: 90.5 fL (ref 80.0–100.0)
Platelets: 153 10*3/uL (ref 150–400)
RBC: 2.43 MIL/uL — ABNORMAL LOW (ref 4.22–5.81)
RDW: 15 % (ref 11.5–15.5)
WBC: 14.9 10*3/uL — ABNORMAL HIGH (ref 4.0–10.5)
nRBC: 0 % (ref 0.0–0.2)

## 2019-04-01 LAB — PTH, INTACT AND CALCIUM
Calcium, Total (PTH): 7.5 mg/dL — ABNORMAL LOW (ref 8.7–10.2)
PTH: 155 pg/mL — ABNORMAL HIGH (ref 15–65)

## 2019-04-01 LAB — GLUCOSE, CAPILLARY
Glucose-Capillary: 89 mg/dL (ref 70–99)
Glucose-Capillary: 81 mg/dL (ref 70–99)

## 2019-04-01 MED ORDER — CARVEDILOL 12.5 MG PO TABS
12.5000 mg | ORAL_TABLET | Freq: Two times a day (BID) | ORAL | Status: DC
  Administered 2019-04-01 – 2019-04-02 (×3): 12.5 mg via ORAL
  Filled 2019-04-01 (×3): qty 1

## 2019-04-01 MED ORDER — HYDRALAZINE HCL 50 MG PO TABS
25.0000 mg | ORAL_TABLET | Freq: Three times a day (TID) | ORAL | Status: DC
  Administered 2019-04-01 – 2019-04-02 (×2): 25 mg via ORAL
  Filled 2019-04-01 (×2): qty 1

## 2019-04-01 MED ORDER — CHLORHEXIDINE GLUCONATE CLOTH 2 % EX PADS
6.0000 | MEDICATED_PAD | Freq: Every day | CUTANEOUS | Status: DC
  Administered 2019-04-03 – 2019-04-06 (×4): 6 via TOPICAL

## 2019-04-01 MED ORDER — SODIUM CHLORIDE 0.9% FLUSH
10.0000 mL | Freq: Two times a day (BID) | INTRAVENOUS | Status: DC
  Administered 2019-04-01 – 2019-04-06 (×9): 10 mL

## 2019-04-01 MED ORDER — SODIUM CHLORIDE 0.9% FLUSH
10.0000 mL | INTRAVENOUS | Status: DC | PRN
  Administered 2019-04-01 – 2019-04-05 (×2): 10 mL
  Filled 2019-04-01: qty 40

## 2019-04-01 MED ORDER — ACETAMINOPHEN 325 MG PO TABS
650.0000 mg | ORAL_TABLET | Freq: Four times a day (QID) | ORAL | Status: DC | PRN
  Administered 2019-04-01 – 2019-04-03 (×3): 650 mg via ORAL
  Filled 2019-04-01 (×3): qty 2

## 2019-04-01 MED ORDER — AMLODIPINE BESYLATE 10 MG PO TABS
10.0000 mg | ORAL_TABLET | Freq: Every day | ORAL | Status: DC
  Administered 2019-04-01 – 2019-04-06 (×6): 10 mg via ORAL
  Filled 2019-04-01 (×6): qty 1

## 2019-04-01 NOTE — Progress Notes (Signed)
PT Cancellation Note  Patient Details Name: [name] MRN: [id number] DOB: 1990/09/24   Cancelled Treatment:    Reason Eval/Treat Not Completed: Patient at procedure or test/unavailable. Pt on HD.   Shary Decamp [facility] 04/01/2019, 3:09 PM Mickey Esguerra [place] Pager [phone] Office [phone]

## 2019-04-01 NOTE — Progress Notes (Signed)
Admit: 03/29/2019 LOS: 3  43M with severe renal failure of unclear chronicity (Had SCr 1.4 11/2017) and severe anemia with gastrointestinal hemorrhage  Subjective:  . HD yesterday: 1.7L UF . HF [place] this AM; BiPAP overnight . Still on nicardipine gtt . Low grade fevers . NOt yet taking PO meds  07/23 0701 - 07/24 0700 In: 2068 [I.V.:1868.1; IV Piggyback:199.8] Out: 1955 [Emesis/NG output:250]  Filed Weights   03/31/19 1300 03/31/19 1613 04/01/19 0500  Weight: 93.3 kg 91.7 kg 90.9 kg    Scheduled Meds: . chlorhexidine  15 mL Mouth Rinse BID  . Chlorhexidine Gluconate Cloth  6 each Topical Q0600  . darbepoetin (ARANESP) injection - DIALYSIS  100 mcg Intravenous Q Thu-HD  . labetalol  10 mg Intravenous Q8H  . mouth rinse  15 mL Mouth Rinse q12n4p  . pantoprazole (PROTONIX) IV  40 mg Intravenous Q12H   Continuous Infusions: . sodium chloride    . sodium chloride    . fentaNYL infusion INTRAVENOUS Stopped (03/30/19 1510)  . niCARDipine 7 mg/hr (04/01/19 0800)   PRN Meds:.sodium chloride, sodium chloride, acetaminophen, alteplase, fentaNYL, heparin, heparin, hydrALAZINE, lidocaine (PF), lidocaine-prilocaine, ondansetron (ZOFRAN) IV, pentafluoroprop-tetrafluoroeth  Current Labs: reviewed    Physical Exam:  Blood pressure (!) 154/97, pulse (!) 115, temperature 100.2 F (37.9 C), temperature source Axillary, resp. rate (!) 21, height 6\' 2"  (1.88 m), weight 90.9 kg, SpO2 94 %. GEN:  on HFNC, awake, conversant ENT: NCAT  EYES: EOMI CV: RRR, normal S1 and S2 PULM: Anterior auscultation, coarse b/l ABD: Mild diffuse tenderness, no rebound or guarding SKIN: No rashes or lesions, right IJ tunneled dialysis catheter bandaged with no erythema or purulence EXT: 1+ peripheral edema  A 1. Renal failure, unclear chronicity: Started HD at outside facility; significant azotemia related to renal failure and GI hemorrhage; has R IJ Temp HD Cath; Likely ESRD but did have fairly normal GFR  11/2017 2. UGIB and LGIB, EGD 7/21 with esophageal ulcer, clipped, gastric ulcer; GI following 3. ABLA Severe, stable; Hb 7s this AM 4. HTN, on nicardipine gtt; starting oral meds when able 5. CKD BMD: P 10.6, Follow for now, no binders: PTH 155 stable  P  . HD again today: 3.5h, Qb 350, 3K, 3L UF, No heparin . Will need permanent access, TDC; hold on VVS consult given low grade fevers overnight . Ween nicardipine . Will start CLIP . Medication Issues; o Preferred narcotic agents for pain control are hydromorphone, fentanyl, and methadone. Morphine should not be used.  o Baclofen should be avoided o Avoid oral sodium phosphate and magnesium citrate based laxatives / bowel preps    Pearson Grippe MD 04/01/2019, 8:35 AM  Recent Labs  Lab 03/30/19 0807 03/31/19 0350 03/31/19 1304 04/01/19 0529  NA 135 136  --  133*  K 3.6 4.6  --  3.8  CL 101 98  --  99  CO2 19* 17*  --  19*  GLUCOSE 86 87  --  91  BUN 118* 101*  --  64*  CREATININE 17.19* 17.91*  --  13.16*  CALCIUM 7.3* 7.7* 7.5* 7.7*  PHOS 7.3*  --  10.6*  --    Recent Labs  Lab 03/29/19 1135  03/30/19 0611  03/30/19 2339 03/31/19 0350 04/01/19 0529  WBC 14.5*   < > 13.9*  --   --  16.4* 14.9*  NEUTROABS 13.8*  --   --   --   --   --   --   HGB  4.8*   < > 8.3*   < > 8.2* 8.3* 7.4*  HCT 13.8*   < > 24.3*   < > 24.2* 24.4* 22.0*  MCV 87.9   < > 90.7  --   --  89.7 90.5  PLT 148*   < > 101*  --   --  131* 153   < > = values in this interval not displayed.

## 2019-04-01 NOTE — Progress Notes (Signed)
Completed the [facility] swallow screen now. Patient passed and will notify MD for diet orders.

## 2019-04-01 NOTE — Progress Notes (Signed)
NAME:  [name], MRN:  [id number], DOB:  1990/09/25, LOS: 3 ADMISSION DATE:  03/29/2019, CONSULTATION DATE:  03/29/2019 REFERRING MD:  [facility], CHIEF COMPLAINT:  HTN   Brief History   28 y.o male with CKD Stage V, malignant HTN, and prior CVA presented to an outside facility with hypertensive emergency (seizure, possibly ischemic colitis) and UGIB. Managed acutely with IV Clevidipine and emergent EGD. S/p 3 clips to a gastric ulcer but continued to have hematemesis and transferred to [facility] for further evaluation.   History of present illness   Presented to an outside hospital with 5 days of N/V and diffuse abdominal pain. Initially found to have a BP >200/100. While in the ED had witness seizure like activity. He was started on a Clevidine drip and admitted for further evaluation. Labs were significant for acute on chronic renal failure. CT of the head was unremarkable, followed by a CT abdomen that illustrated acute colitis. While hospitalized he then developed  Hematemesis and hematochezia. Subsequently went for EGD and sigmoidoscopy. EGD illustrated diffuse gastritis and a single gastric ulcer actively bleeding. S/p 3 clips. Sigmoidoscopy illustrated diffuse friable, erythematous, boggy tissue. Biopsies taken but the report is unavailable. GI pathogen panel including C. Diff, was negative but he was continued on IV metronidazole. The morning of transfer he continued to have diffuse abdominal pain with hematemesis and he was transferred for further evaluation.    In regards to the patient's renal failure he is baseline CKD Stage V that has since progressed with this acute event. He has a HD cath in the right IJ. Last session on 7/17.   Past Medical History  Malignant HTN  CKD Stage V CVA  Significant Hospital Events   7/20 OSH EGD, clipping cautery, epi 7/21 transferred here, intubated, bedside EGD, more clipping 7/22 iHD, run even 7/23 iHD neg 2L  Consults:  GI  Nephrology   Procedures:  Right IJ HD cath 7/17 L IJ MML 7/21  Significant Diagnostic Tests:  Outside:  CT head > unremarkable  CT abdomen > diffuse colitis  EGD > diffuse gastritis and a single gastric ulcer actively bleeding Sigmoidoscopy > diffuse friable, erythematous, boggy tissue  Here: EGD multiple ulcers, one clipped again, no active bleeding, large clot in antrum incompletely suctioned  Micro Data:  GI pathogen panel negative  C. Diff negative  COVID negative   Antimicrobials:  None  Interim history/subjective:  More awake this AM.  Wore bipap all night.  Thirsty.  Overall very fatigued.  Denies abd pain, N/V.  Had borderline low grade fevers.  Objective   Blood pressure (!) 149/84, pulse (!) 114, temperature 100.2 F (37.9 C), temperature source Axillary, resp. rate 18, height 6\' 2"  (1.88 m), weight 90.9 kg, SpO2 93 %.    Vent Mode: BIPAP FiO2 (%):  [40 %] 40 % PEEP:  [8 cmH20] 8 cmH20 Pressure Support:  [14 cmH20] 14 cmH20   Intake/Output Summary (Last 24 hours) at 04/01/2019 0801 Last data filed at 04/01/2019 0700 Gross per 24 hour  Intake 1933.98 ml  Output 1705 ml  Net 228.98 ml   Filed Weights   03/31/19 1300 03/31/19 1613 04/01/19 0500  Weight: 93.3 kg 91.7 kg 90.9 kg    GEN: young man in NAD HEENT:[place] in place, MM dry CV: Regular rhythm, tachycardic, ext warm PULM: Poor inspiratory effort, no accessory muscle use GI: Soft, hypoactive BS EXT: No edema NEURO: follows commands briskly PSYCH:more oriented today SKIN: no rashes, HD/MML CDI  Resolved  Hospital Problem list     Assessment & Plan:  # Hypertensive emergency- improved, on nicardipine drip due to questionable PO yesterday # Acute UGIB- improved/stable after clipping and rescuscitation 7/21.  Total 5 pRBC and 3 FFP.  Crit drifted down a bit today, no further signs of bleeding. # Acute on chronic hypertensive nephropathy- now HD dependent. # Seizures- improved with BP control, started on keppra by  OSH neuro.  EEG here is nonfocal. # Colitis- question of ischemic raised at OSH, path nonspecific # Hypoxemia- remaining issues seem to be pulmonary toileting related # Uremic encephalopathy- worsened by bleed, improved today  - Protonix BID, daily H/H, goal Hgb 7 - iHD today or tomorrow, would like negative from a lung standpoint - Stop keppra due to its sedating effect and questionable indication given EEG findings - Try to hold off on further BIPAP, swallow screen, restart home antihypertensives if passes - OOB, CPT, PT consult - If clinically looks good this afternoon, will DC central line - OP f/u for questionable ischemic colitis  Best practice:  Diet: pending swallow scren Pain/Anxiety/Delirium protocol (if indicated): trial off today VAP protocol (if indicated): NA DVT prophylaxis: SCDs GI prophylaxis: Protonix Glucose control: not needed Mobility: OOB to chair, PT Code Status: Full Family Communication: will call fiance today Disposition: maybe progressive depending on if we can get him off nicardipine drip   Erskine Emery MD [facility] 04/01/19 8:01 AM Personal pager: #[phone] If unanswered, please page CCM On-call: [phone]

## 2019-04-02 ENCOUNTER — Inpatient Hospital Stay (HOSPITAL_COMMUNITY): Payer: Medicaid Other

## 2019-04-02 DIAGNOSIS — M79609 Pain in unspecified limb: Secondary | ICD-10-CM

## 2019-04-02 DIAGNOSIS — M7989 Other specified soft tissue disorders: Secondary | ICD-10-CM

## 2019-04-02 LAB — IRON AND TIBC
Iron: 21 ug/dL — ABNORMAL LOW (ref 45–182)
Saturation Ratios: 15 % — ABNORMAL LOW (ref 17.9–39.5)
TIBC: 137 ug/dL — ABNORMAL LOW (ref 250–450)
UIBC: 116 ug/dL

## 2019-04-02 LAB — CBC
HCT: 23.7 % — ABNORMAL LOW (ref 39.0–52.0)
Hemoglobin: 8 g/dL — ABNORMAL LOW (ref 13.0–17.0)
MCH: 30.7 pg (ref 26.0–34.0)
MCHC: 33.8 g/dL (ref 30.0–36.0)
MCV: 90.8 fL (ref 80.0–100.0)
Platelets: 195 10*3/uL (ref 150–400)
RBC: 2.61 MIL/uL — ABNORMAL LOW (ref 4.22–5.81)
RDW: 15.3 % (ref 11.5–15.5)
WBC: 16.2 10*3/uL — ABNORMAL HIGH (ref 4.0–10.5)
nRBC: 0.1 % (ref 0.0–0.2)

## 2019-04-02 LAB — FERRITIN: Ferritin: 458 ng/mL — ABNORMAL HIGH (ref 24–336)

## 2019-04-02 MED ORDER — FENTANYL CITRATE (PF) 100 MCG/2ML IJ SOLN
25.0000 ug | INTRAMUSCULAR | Status: DC | PRN
  Administered 2019-04-04 – 2019-04-05 (×2): 25 ug via INTRAVENOUS
  Filled 2019-04-02 (×2): qty 2

## 2019-04-02 MED ORDER — HYDRALAZINE HCL 50 MG PO TABS
50.0000 mg | ORAL_TABLET | Freq: Three times a day (TID) | ORAL | Status: DC
  Administered 2019-04-02 – 2019-04-03 (×3): 50 mg via ORAL
  Filled 2019-04-02 (×3): qty 1

## 2019-04-02 MED ORDER — LABETALOL HCL 5 MG/ML IV SOLN
10.0000 mg | INTRAVENOUS | Status: DC | PRN
  Administered 2019-04-03 – 2019-04-06 (×4): 10 mg via INTRAVENOUS
  Filled 2019-04-02 (×4): qty 4

## 2019-04-02 MED ORDER — PANTOPRAZOLE SODIUM 40 MG PO TBEC
40.0000 mg | DELAYED_RELEASE_TABLET | Freq: Two times a day (BID) | ORAL | Status: DC
  Administered 2019-04-02 – 2019-04-06 (×10): 40 mg via ORAL
  Filled 2019-04-02 (×11): qty 1

## 2019-04-02 MED ORDER — HEPARIN SODIUM (PORCINE) 1000 UNIT/ML DIALYSIS
20.0000 [IU]/kg | INTRAMUSCULAR | Status: DC | PRN
  Filled 2019-04-02: qty 2

## 2019-04-02 MED ORDER — CARVEDILOL 25 MG PO TABS
25.0000 mg | ORAL_TABLET | Freq: Two times a day (BID) | ORAL | Status: DC
  Administered 2019-04-02 – 2019-04-06 (×9): 25 mg via ORAL
  Filled 2019-04-02 (×9): qty 1

## 2019-04-02 MED ORDER — OXYCODONE-ACETAMINOPHEN 5-325 MG PO TABS
1.0000 | ORAL_TABLET | Freq: Four times a day (QID) | ORAL | Status: DC | PRN
  Administered 2019-04-02 – 2019-04-05 (×9): 1 via ORAL
  Filled 2019-04-02 (×9): qty 1

## 2019-04-02 NOTE — Progress Notes (Signed)
PT Cancellation Note  Patient Details Name: [name] MRN: [id number] DOB: [birth year]   Cancelled Treatment:    Reason Eval/Treat Not Completed: Patient at procedure or test/unavailable.  Pt in HD this afternoon.  Will try back today as able. 04/02/2019  Donnella Sham, [facility] [phone]  (pager) [phone]  (office)   [name] [name] 04/02/2019, 2:18 PM

## 2019-04-02 NOTE — Progress Notes (Signed)
Patient transferred off unit for Hemodialysis.

## 2019-04-02 NOTE — Progress Notes (Signed)
Admit: 03/29/2019 LOS: 4  15M with severe renal failure of unclear chronicity (Had SCr 1.4 11/2017) and severe anemia with gastrointestinal hemorrhage  Subjective:  . HD yesterday: 3L UF . Hb stable . [place] this AM . BP still up on 3 meds; uncontrolled  07/24 0701 - 07/25 0700 In: 1328.8 [P.O.:940; I.V.:388.8] Out: 1 [Stool:1]  Filed Weights   04/01/19 0500 04/01/19 1235 04/02/19 0500  Weight: 90.9 kg 96.7 kg 90.2 kg    Scheduled Meds: . amLODipine  10 mg Oral Daily  . carvedilol  12.5 mg Oral BID WC  . chlorhexidine  15 mL Mouth Rinse BID  . Chlorhexidine Gluconate Cloth  6 each Topical Daily  . darbepoetin (ARANESP) injection - DIALYSIS  100 mcg Intravenous Q Thu-HD  . hydrALAZINE  25 mg Oral Q8H  . mouth rinse  15 mL Mouth Rinse q12n4p  . pantoprazole  40 mg Oral BID AC  . sodium chloride flush  10-40 mL Intracatheter Q12H   Continuous Infusions: . sodium chloride    . sodium chloride     PRN Meds:.sodium chloride, sodium chloride, acetaminophen, alteplase, fentaNYL (SUBLIMAZE) injection, heparin, heparin, labetalol, lidocaine (PF), lidocaine-prilocaine, ondansetron (ZOFRAN) IV, oxyCODONE-acetaminophen, pentafluoroprop-tetrafluoroeth, sodium chloride flush  Current Labs: reviewed    Physical Exam:  Blood pressure (!) 171/111, pulse (!) 106, temperature 98.8 F (37.1 C), temperature source Oral, resp. rate (!) 32, height 6\' 2"  (1.88 m), weight 90.2 kg, SpO2 94 %. GEN:  on [place], awake, conversant ENT: NCAT  EYES: EOMI CV: RRR, normal S1 and S2 PULM:  Diminished in bases with some crackles, clear otherwise, normal work of breathing ABD:  Soft, nontender SKIN: No rashes or lesions, right IJ temporary dialysis catheter bandaged with no erythema or purulence EXT: 1+ peripheral edema  A 1. Anuric renal failure, unclear chronicity but favor chronic/longstanding: Started HD at outside facility; significant azotemia related to renal failure and GI hemorrhage; has R IJ Temp HD  Cath; Likely ESRD but did have fairly normal GFR 11/2017 2. UGIB and LGIB, EGD 7/21 with esophageal ulcer, clipped, gastric ulcer; GI following; 3. Stable 4. ABLA Severe, stable; Hb 7s this AM 5. HTN, off parenteral agents, uncontrolled 6. CKD BMD: P 10.6, Follow for now, no binders: PTH 155 stable  P  . HD again today: 4h, Qb 350, 3K, 4-5L UF, No heparin . Follow over the weekend, see if urine output picks up . If not, 2ill need permanent access, TDC; needs to have low-grade fevers  . Increase carvedilol and hydralazine  . CLIP in process, lives in [place] . Medication Issues; o Preferred narcotic agents for pain control are hydromorphone, fentanyl, and methadone. Morphine should not be used.  o Baclofen should be avoided o Avoid oral sodium phosphate and magnesium citrate based laxatives / bowel preps    Pearson Grippe MD 04/02/2019, 8:13 AM  Recent Labs  Lab 03/30/19 0807 03/31/19 0350 03/31/19 1304 04/01/19 0529 04/02/19 0523  NA 135 136  --  133* 134*  K 3.6 4.6  --  3.8 3.8  CL 101 98  --  99 98  CO2 19* 17*  --  19* 24  GLUCOSE 86 87  --  91 102*  BUN 118* 101*  --  64* 32*  CREATININE 17.19* 17.91*  --  13.16* 8.28*  CALCIUM 7.3* 7.7* 7.5* 7.7* 7.7*  PHOS 7.3*  --  10.6*  --   --    Recent Labs  Lab 03/29/19 1135  03/31/19 0350 04/01/19  8016 04/02/19 0523  WBC 14.5*   < > 16.4* 14.9* 16.2*  NEUTROABS 13.8*  --   --   --   --   HGB 4.8*   < > 8.3* 7.4* 8.0*  HCT 13.8*   < > 24.4* 22.0* 23.7*  MCV 87.9   < > 89.7 90.5 90.8  PLT 148*   < > 131* 153 195   < > = values in this interval not displayed.

## 2019-04-02 NOTE — Progress Notes (Signed)
NAME:  [name], MRN:  [id number], DOB:  [birth year], LOS: 4 ADMISSION DATE:  03/29/2019, CONSULTATION DATE:  03/29/2019 REFERRING MD:  [facility], CHIEF COMPLAINT:  HTN   Brief History   28 yo male presented to [facility] with 5 days of N/V and abdominal pain.  Found to have BP 200/100 and possible seizure.  Started on cardene.  Found to have worsening renal failure, colitis.  Developed hematemesis and hematochezia from Gastric ulcer and required clipping.  Sigmoidoscopy showed diffusely friable, boggy tissue.  He was treated with flagyl.  Past Medical History  HTN, CKD V, CVA  Significant Hospital Events   7/20 OSH EGD, clipping cautery, epi 7/21 transferred here, intubated, bedside EGD, more clipping 7/22 iHD, run even 7/23 iHD neg 2L 7/24 off cardene  Consults:  GI  Nephrology   Procedures:  Right IJ HD cath 7/17 L IJ MML 7/21  Significant Diagnostic Tests:  Outside:  CT head > unremarkable  CT abdomen > diffuse colitis  EGD > diffuse gastritis and a single gastric ulcer actively bleeding Sigmoidoscopy > diffuse friable, erythematous, boggy tissue  Here: EGD multiple ulcers, one clipped again, no active bleeding, large clot in antrum incompletely suctioned  Micro Data:  GI pathogen panel negative  C. Diff negative  COVID negative   Antimicrobials:    Interim history/subjective:  Denies HA, chest pain, dyspnea.  Abdominal pain better.  Has discomfort in Rt leg.  Objective   Blood pressure (!) 171/111, pulse (!) 106, temperature 98.8 F (37.1 C), temperature source Oral, resp. rate (!) 32, height 6\' 2"  (1.88 m), weight 90.2 kg, SpO2 95 %.    Vent Mode: BIPAP FiO2 (%):  [40 %] 40 % PEEP:  [8 cmH20] 8 cmH20 Pressure Support:  [14 cmH20] 14 cmH20   Intake/Output Summary (Last 24 hours) at 04/02/2019 0733 Last data filed at 04/02/2019 4709 Gross per 24 hour  Intake 1328.8 ml  Output 1 ml  Net 1327.8 ml   Filed Weights   04/01/19 0500 04/01/19 1235  04/02/19 0500  Weight: 90.9 kg 96.7 kg 90.2 kg     General - alert Eyes - pupils reactive ENT - no sinus tenderness, no stridor Cardiac - regular rate/rhythm, no murmur Chest - equal breath sounds b/l, no wheezing or rales Abdomen - soft, non tender, + bowel sounds Extremities - no cyanosis, clubbing, or edema Skin - no rashes Neuro - normal strength, moves extremities, follows commands Psych - normal mood and behavior   Resolved Hospital Problem list   Hypertensive emergency, acute uremic encephalopathy, hypoxemia from atelectasis  Assessment & Plan:   Hypertension. - continue norvasc, coreg, hydralazine - prn labetalol - goal SBP < 170, DBP < 105  Acute Upper GI bleed from gastric ulcer. - received total 5 units PRBC, 3 units FFP - no signs of additional bleeding - protonix BID  Acute blood loss anemia. - f/u CBC - transfuse for Hb < 7 - check iron levels  ESRD. - HD per renal  Colitis. - f/u pathology results from [facility] when available  Rt leg pain. - no signs of infection or DVT - monitor clinically - prn analgesics   Best practice:  Diet: renal diet DVT prophylaxis: SCDs GI prophylaxis: Protonix Mobility: PT/OT Code Status: Full Disposition: transfer to tele 7/25 >> to [facility] 7/26 and PCCM off  Labs:   CBC Latest Ref Rng & Units 04/02/2019 04/01/2019 03/31/2019  WBC 4.0 - 10.5 K/uL 16.2(H) 14.9(H) 16.4(H)  Hemoglobin 13.0 -  17.0 g/dL 8.0(L) 7.4(L) 8.3(L)  Hematocrit 39.0 - 52.0 % 23.7(L) 22.0(L) 24.4(L)  Platelets 150 - 400 K/uL 195 153 131(L)   CMP Latest Ref Rng & Units 04/02/2019 04/01/2019 03/31/2019  Glucose 70 - 99 mg/dL 102(H) 91 87  BUN 6 - 20 mg/dL 32(H) 64(H) 101(H)  Creatinine 0.61 - 1.24 mg/dL 8.28(H) 13.16(H) 17.91(H)  Sodium 135 - 145 mmol/L 134(L) 133(L) 136  Potassium 3.5 - 5.1 mmol/L 3.8 3.8 4.6  Chloride 98 - 111 mmol/L 98 99 98  CO2 22 - 32 mmol/L 24 19(L) 17(L)  Calcium 8.9 - 10.3 mg/dL 7.7(L) 7.7(L) 7.5(L)  Total  Protein 6.5 - 8.1 g/dL - - -  Total Bilirubin 0.3 - 1.2 mg/dL - - -  Alkaline Phos 38 - 126 U/L - - -  AST 15 - 41 U/L - - -  ALT 0 - 44 U/L - - -   Chesley Mires, MD [place] 04/02/2019, 7:43 AM

## 2019-04-02 NOTE — Progress Notes (Signed)
Doppler legs negative.  Chesley Mires, MD [facility] Pulmonary/Critical Care 04/02/2019, 4:51 PM

## 2019-04-02 NOTE — Progress Notes (Signed)
Still having leg pain.  Will check doppler.  Chesley Mires, MD [facility] Pulmonary/Critical Care 04/02/2019, 9:56 AM

## 2019-04-02 NOTE — Progress Notes (Signed)
Bilateral lower extremity venous duplex completed. Refer to "CV Proc" under chart review to view preliminary results.  04/02/2019 11:33 AM Maudry Mayhew, MHA, RVT, RDCS, RDMS

## 2019-04-03 ENCOUNTER — Encounter (HOSPITAL_COMMUNITY): Payer: Self-pay | Admitting: *Deleted

## 2019-04-03 ENCOUNTER — Inpatient Hospital Stay (HOSPITAL_COMMUNITY): Payer: Medicaid Other

## 2019-04-03 DIAGNOSIS — Z992 Dependence on renal dialysis: Secondary | ICD-10-CM

## 2019-04-03 DIAGNOSIS — K250 Acute gastric ulcer with hemorrhage: Secondary | ICD-10-CM

## 2019-04-03 DIAGNOSIS — N186 End stage renal disease: Secondary | ICD-10-CM

## 2019-04-03 DIAGNOSIS — K2211 Ulcer of esophagus with bleeding: Principal | ICD-10-CM

## 2019-04-03 DIAGNOSIS — I161 Hypertensive emergency: Secondary | ICD-10-CM

## 2019-04-03 DIAGNOSIS — N179 Acute kidney failure, unspecified: Secondary | ICD-10-CM

## 2019-04-03 DIAGNOSIS — K920 Hematemesis: Secondary | ICD-10-CM

## 2019-04-03 LAB — BASIC METABOLIC PANEL
Anion gap: 12 (ref 5–15)
Anion gap: 9 (ref 5–15)
BUN: 32 mg/dL — ABNORMAL HIGH (ref 6–20)
BUN: 21 mg/dL — ABNORMAL HIGH (ref 6–20)
CO2: 24 mmol/L (ref 22–32)
CO2: 26 mmol/L (ref 22–32)
Calcium: 7.7 mg/dL — ABNORMAL LOW (ref 8.9–10.3)
Calcium: 7.7 mg/dL — ABNORMAL LOW (ref 8.9–10.3)
Chloride: 98 mmol/L (ref 98–111)
Chloride: 97 mmol/L — ABNORMAL LOW (ref 98–111)
Creatinine, Ser: 8.28 mg/dL — ABNORMAL HIGH (ref 0.61–1.24)
Creatinine, Ser: 6.98 mg/dL — ABNORMAL HIGH (ref 0.61–1.24)
GFR calc Af Amer: 9 mL/min — ABNORMAL LOW (ref >60)
GFR calc Af Amer: 11 mL/min — ABNORMAL LOW (ref >60)
GFR calc non Af Amer: 8 mL/min — ABNORMAL LOW (ref >60)
GFR calc non Af Amer: 10 mL/min — ABNORMAL LOW (ref >60)
Glucose, Bld: 102 mg/dL — ABNORMAL HIGH (ref 70–99)
Glucose, Bld: 122 mg/dL — ABNORMAL HIGH (ref 70–99)
Potassium: 3.8 mmol/L (ref 3.5–5.1)
Potassium: 3.3 mmol/L — ABNORMAL LOW (ref 3.5–5.1)
Sodium: 134 mmol/L — ABNORMAL LOW (ref 135–145)
Sodium: 132 mmol/L — ABNORMAL LOW (ref 135–145)

## 2019-04-03 LAB — GLUCOSE, CAPILLARY: Glucose-Capillary: 103 mg/dL — ABNORMAL HIGH (ref 70–99)

## 2019-04-03 LAB — HEMOGLOBIN A1C
Hgb A1c MFr Bld: 5.1 % (ref 4.8–5.6)
Mean Plasma Glucose: 99.67 mg/dL

## 2019-04-03 LAB — CBC
HCT: 25.2 % — ABNORMAL LOW (ref 39.0–52.0)
Hemoglobin: 8.3 g/dL — ABNORMAL LOW (ref 13.0–17.0)
MCH: 30.4 pg (ref 26.0–34.0)
MCHC: 32.9 g/dL (ref 30.0–36.0)
MCV: 92.3 fL (ref 80.0–100.0)
Platelets: 230 10*3/uL (ref 150–400)
RBC: 2.73 MIL/uL — ABNORMAL LOW (ref 4.22–5.81)
RDW: 15.1 % (ref 11.5–15.5)
WBC: 13.6 10*3/uL — ABNORMAL HIGH (ref 4.0–10.5)
nRBC: 0 % (ref 0.0–0.2)

## 2019-04-03 LAB — HEMOGLOBIN AND HEMATOCRIT, BLOOD
HCT: 26.7 % — ABNORMAL LOW (ref 39.0–52.0)
Hemoglobin: 8.9 g/dL — ABNORMAL LOW (ref 13.0–17.0)

## 2019-04-03 LAB — PHOSPHORUS: Phosphorus: 3.6 mg/dL (ref 2.5–4.6)

## 2019-04-03 LAB — TROPONIN I (HIGH SENSITIVITY): Troponin I (High Sensitivity): 1234 ng/L (ref <18)

## 2019-04-03 MED ORDER — ISOSORBIDE DINITRATE 10 MG PO TABS
10.0000 mg | ORAL_TABLET | Freq: Three times a day (TID) | ORAL | Status: DC
  Administered 2019-04-03 – 2019-04-04 (×4): 10 mg via ORAL
  Filled 2019-04-03 (×7): qty 1

## 2019-04-03 MED ORDER — ALUM & MAG HYDROXIDE-SIMETH 200-200-20 MG/5ML PO SUSP
30.0000 mL | Freq: Once | ORAL | Status: AC
  Administered 2019-04-03: 30 mL via ORAL
  Filled 2019-04-03: qty 30

## 2019-04-03 MED ORDER — CEFAZOLIN SODIUM-DEXTROSE 1-4 GM/50ML-% IV SOLN
1.0000 g | INTRAVENOUS | Status: AC
  Administered 2019-04-04: 14:00:00 1 g via INTRAVENOUS
  Filled 2019-04-03 (×2): qty 50

## 2019-04-03 MED ORDER — HYDRALAZINE HCL 50 MG PO TABS
100.0000 mg | ORAL_TABLET | Freq: Three times a day (TID) | ORAL | Status: DC
  Administered 2019-04-03 – 2019-04-06 (×7): 100 mg via ORAL
  Filled 2019-04-03 (×7): qty 2

## 2019-04-03 NOTE — Progress Notes (Signed)
VASCULAR LAB PRELIMINARY  PRELIMINARY  PRELIMINARY  PRELIMINARY  Upper extremity vein mapping completed.    Preliminary report:  See CV proc for preliminary results.   Vilda Zollner, RVT 04/03/2019, 11:58 AM

## 2019-04-03 NOTE — H&P (View-Only) (Signed)
REASON FOR CONSULT:    The consult is for hemodialysis access.  The consult is requested by Dr. Pearson Grippe.  ASSESSMENT & PLAN:   END-STAGE RENAL DISEASE: His vein map is pending.  I will schedule him for a left arteriovenous fistula or arteriovenous graft on Monday if ok with primary service and GI.  We will also exchange his temporary catheter for a tunneled dialysis catheter.  Please do not dialyze him tomorrow if possible so as not to interfere with his OR schedule.  I have discussed the procedure and potential complications with the patient and he is agreeable to proceed.  Deitra Mayo, MD, FACS [facility] [phone] Office: [phone]   HPI:   [name] is a pleasant 28 y.o. male, who was admitted on 03/29/2019 with a hypertensive crisis, GI bleed, and questionable seizures.  This is a chronically ill 28 year old gentleman with poorly controlled blood pressure and stage V chronic kidney disease.  His hospital course was complicated by acute on chronic renal failure.  He is currently on dialysis via a catheter.  He was last dialyzed on 04/01/2019.  He has undergone work-up for his GI bleed including upper GI and lower GI.  EGD on 721 showed esophageal ulcer which was clipped and a gastric ulcer.  GI is following.  The patient is right-handed.  He does not have a pacemaker.  His renal insufficiency is related to poorly controlled blood pressure.  History reviewed. No pertinent past medical history.  History reviewed. No pertinent family history.  SOCIAL HISTORY: He tells me that he does smoke cigarettes on the weekend. Social History   Socioeconomic History  . Marital status: Single    Spouse name: Not on file  . Number of children: Not on file  . Years of education: Not on file  . Highest education level: Not on file  Occupational History  . Not on file  Social Needs  . Financial resource strain: Not on file  . Food insecurity    Worry: Not on file    Inability:  Not on file  . Transportation needs    Medical: Not on file    Non-medical: Not on file  Tobacco Use  . Smoking status: Not on file  Substance and Sexual Activity  . Alcohol use: Not on file  . Drug use: Not on file  . Sexual activity: Not on file  Lifestyle  . Physical activity    Days per week: Not on file    Minutes per session: Not on file  . Stress: Not on file  Relationships  . Social Herbalist on phone: Not on file    Gets together: Not on file    Attends religious service: Not on file    Active member of club or organization: Not on file    Attends meetings of clubs or organizations: Not on file    Relationship status: Not on file  . Intimate partner violence    Fear of current or ex partner: Not on file    Emotionally abused: Not on file    Physically abused: Not on file    Forced sexual activity: Not on file  Other Topics Concern  . Not on file  Social History Narrative  . Not on file    Not on File  Current Facility-Administered Medications  Medication Dose Route Frequency Provider Last Rate Last Dose  . 0.9 %  sodium chloride infusion  100 mL Intravenous PRN Chesley Mires, MD      .  0.9 %  sodium chloride infusion  100 mL Intravenous PRN Chesley Mires, MD      . acetaminophen (TYLENOL) tablet 650 mg  650 mg Oral Q6H PRN Chesley Mires, MD   650 mg at 04/01/19 1600  . alteplase (CATHFLO ACTIVASE) injection 2 mg  2 mg Intracatheter Once PRN Chesley Mires, MD      . amLODipine (NORVASC) tablet 10 mg  10 mg Oral Daily Chesley Mires, MD   10 mg at 04/02/19 0946  . carvedilol (COREG) tablet 25 mg  25 mg Oral BID WC Pearson Grippe B, MD   25 mg at 04/03/19 0837  . chlorhexidine (PERIDEX) 0.12 % solution 15 mL  15 mL Mouth Rinse BID Chesley Mires, MD   15 mL at 04/02/19 0947  . Chlorhexidine Gluconate Cloth 2 % PADS 6 each  6 each Topical Daily Sood, Vineet, MD      . Darbepoetin Alfa (ARANESP) injection 100 mcg  100 mcg Intravenous Q Thu-HD Chesley Mires, MD   100  mcg at 03/31/19 1525  . fentaNYL (SUBLIMAZE) injection 25 mcg  25 mcg Intravenous Q4H PRN Chesley Mires, MD      . heparin injection 1,000 Units  1,000 Units Dialysis PRN Chesley Mires, MD   2,800 Units at 03/31/19 1526  . heparin injection 1,000 Units  1,000 Units Dialysis PRN Chesley Mires, MD      . heparin injection 1,800 Units  20 Units/kg Dialysis PRN Pearson Grippe B, MD      . hydrALAZINE (APRESOLINE) tablet 50 mg  50 mg Oral Q8H Pearson Grippe B, MD   50 mg at 04/03/19 0630  . labetalol (NORMODYNE) injection 10 mg  10 mg Intravenous Q4H PRN Chesley Mires, MD   10 mg at 04/03/19 0630  . lidocaine (PF) (XYLOCAINE) 1 % injection 5 mL  5 mL Intradermal PRN Chesley Mires, MD      . lidocaine-prilocaine (EMLA) cream 1 application  1 application Topical PRN Chesley Mires, MD      . MEDLINE mouth rinse  15 mL Mouth Rinse q12n4p Chesley Mires, MD   15 mL at 04/02/19 1713  . ondansetron (ZOFRAN) injection 4 mg  4 mg Intravenous Q6H PRN Chesley Mires, MD   4 mg at 04/03/19 2725  . oxyCODONE-acetaminophen (PERCOCET/ROXICET) 5-325 MG per tablet 1 tablet  1 tablet Oral Q6H PRN Chesley Mires, MD   1 tablet at 04/03/19 0630  . pantoprazole (PROTONIX) EC tablet 40 mg  40 mg Oral BID AC Chesley Mires, MD   40 mg at 04/03/19 0837  . pentafluoroprop-tetrafluoroeth (GEBAUERS) aerosol 1 application  1 application Topical PRN Sood, Vineet, MD      . sodium chloride flush (NS) 0.9 % injection 10-40 mL  10-40 mL Intracatheter Q12H Chesley Mires, MD   10 mL at 04/02/19 2200  . sodium chloride flush (NS) 0.9 % injection 10-40 mL  10-40 mL Intracatheter PRN Chesley Mires, MD   10 mL at 04/01/19 1131    REVIEW OF SYSTEMS:  [X]  denotes positive finding, [ ]  denotes negative finding Cardiac  Comments:  Chest pain or chest pressure:    Shortness of breath upon exertion:    Short of breath when lying flat:    Irregular heart rhythm:        Vascular    Pain in calf, thigh, or hip brought on by ambulation:    Pain in feet at  night that wakes you up from your sleep:     Blood  clot in your veins:    Leg swelling:         Pulmonary    Oxygen at home:    Productive cough:     Wheezing:         Neurologic    Sudden weakness in arms or legs:     Sudden numbness in arms or legs:     Sudden onset of difficulty speaking or slurred speech:    Temporary loss of vision in one eye:     Problems with dizziness:         Gastrointestinal    Blood in stool:     Vomited blood:         Genitourinary    Burning when urinating:     Blood in urine:        Psychiatric    Major depression:         Hematologic    Bleeding problems:    Problems with blood clotting too easily:        Skin    Rashes or ulcers:        Constitutional    Fever or chills:     PHYSICAL EXAM:   Vitals:   04/03/19 0500 04/03/19 0700 04/03/19 0730 04/03/19 0800  BP: (!) 169/114 (!) 160/114 (!) 164/113 (!) 160/110  Pulse: 100 94 100 (!) 101  Resp: (!) 27 18 (!) 30 (!) 28  Temp:  99.6 F (37.6 C)    TempSrc:  Oral    SpO2: 94% 97% 92% 91%  Weight:      Height:        GENERAL: The patient is a well-nourished male, in no acute distress. The vital signs are documented above. CARDIAC: There is a regular rate and rhythm.  VASCULAR: I do not detect carotid bruits. He has palpable brachial and radial pulses bilaterally. He has an IV in his right arm. He has a temporary dialysis catheter in his right IJ. PULMONARY: There is good air exchange bilaterally without wheezing or rales. ABDOMEN: Soft and non-tender with normal pitched bowel sounds.  MUSCULOSKELETAL: There are no major deformities or cyanosis. NEUROLOGIC: No focal weakness or paresthesias are detected. SKIN: There are no ulcers or rashes noted. PSYCHIATRIC: The patient has a normal affect.  DATA:    VEIN MAP: Preoperative vein mapping has been ordered.   LABS: His GFR is 10.  Potassium is 3.3.  White blood cell count 13.6.  Hemoglobin 8.3.  VENOUS DUPLEX: He had a  venous duplex scan yesterday that showed no evidence of DVT in either lower extremity.

## 2019-04-03 NOTE — Progress Notes (Signed)
Patient c/o of chest pain 8/10 and Headache 6/10. EKG performed- normal. Discussed with CCM doctor at bedside- Dr. Orpah Melter- orders to check troponin, give BP meds, and give GI cocktail. During this time patient threw up - given zofran. Per verbal from MD will avoid giving repeat dose of zofran because of prolonged QT.  Patient now asleep. Will continue to monitor and pass on to day shift.   Milford Cage, RN

## 2019-04-03 NOTE — Progress Notes (Signed)
PROGRESS NOTE    Von Quintanar  PIR:[id number] DOB: 09-Jul-1991 DOA: 03/29/2019 PCP: Default, Provider, MD    Brief Narrative:  28 year old male who presented with uncontrolled hypertension.  He does have chronic kidney disease stage V, hypertension and history of prior CVA.  Patient reported 5 days of nausea, vomiting and diffuse abdominal pain, in the emergency department [facility]) his blood pressure was more than 200/100, and had a witnessed generalized seizure.  Was diagnosed with hypertensive emergency and placed on nicardipine infusion.  While hospitalized he developed hematemesis and hematochezia.  EGD showed diffuse gastritis and single gastric ulcer actively bleeding.  Status post 3 clips.  Sigmoidoscopy with diffuse friable edematous tissue.  He had a worsening renal function and he was started on hemodialysis per right internal jugular vein hemodialysis catheter. Patient was transferred to [facility] for further evaluation.  Patient was admitted to the intensive care unit working diagnosis of hypertensive emergency complicated by upper GI bleed and acute kidney injury on chronic kidney disease.  By the time of his transfer he was still having hematemesis and hematochezia.  Patient underwent repeat endoscopy, that required intubation for airway protection (03/29/19).   EGD with nonbleeding esophageal ulcer, please replace.  Nonobstructing nonbleeding gastric ulcer with pigmented material.  Patient required 5 units packed red blood cells and 3 fresh frozen plasma.  Patient was liberated from mechanical ventilation July 22, remain on noninvasive mechanical ventilation until July 25.  Transfer to [facility] July 26.   Assessment & Plan:   Active Problems:   Hypertensive emergency   AKI (acute kidney injury) ([place])   Melena   Hematemesis   Acute gastric ulcer with hemorrhage   Ulcer of esophagus with bleeding   1. Upper GI bleed, positive esophageal and gastric ulcers/ acute blood loss anemia  (upper gi bleed), sp 5 units PRBC and 3 FFP/. Patient had chest pain, left upper abdominal pain this am, but no further signs of bleeding, Hgb at 8,3 and Hct at 25.2 Tolerating po well, continue pantoprazole 40 mg po bid.   2. Hypertensive emergency (present on admission). Blood pressure continue to be not well controlled, systolic in the 630'Z and diastolic in the 601'U. Hydralazine has been increased today to 100 mg tid, and will add isosorbide, continue carvedilol and amlodipine. Continue blood pressure monitoring.   3. CKD stage V, now on HD/ anuric/ suspected hypertensive nephropathy. Patient with euvolemia, K at 3,3 with serum cr at 6,98 with serum bicarbonate at 26. Will continue to follow on renal panel in am. Plan for AV fistula in am, next HD on 04/05/19. Currently has a right IJ HD catheter, to be exchanged to tunneled catheter in am.   4. Elevated troponin. In the setting of reduced GFR, no further chest pain, ekg with no signs of ischemia. Will follow with echocardiogram and check troponin in am. Doubt acute coronary syndrome.    5. Colitis. Pending pathology from colonoscopy, outside hospital.    DVT prophylaxis: scd   Code Status: full Family Communication: no family at the bedside  Disposition Plan/ discharge barriers: transferring to medical telemetry.   Body mass index is 24.26 kg/m. Malnutrition Type:      Malnutrition Characteristics:      Nutrition Interventions:     RN Pressure Injury Documentation:     Consultants:   Nephrology   GI  Procedures:   Endoscopy upper   Antimicrobials:       Subjective: Patient had lower chest upper abdominal pain this am on  the left side, no radiation, no associated symptoms, dull in nature, no improving or worsening factors, his blood pressure has improved but still not well controlled.   Objective: Vitals:   04/03/19 0730 04/03/19 0800 04/03/19 0900 04/03/19 1000  BP: (!) 164/113 (!) 160/110 (!) 172/112  (!) 151/100  Pulse: 100 (!) 101 96 92  Resp: (!) 30 (!) 28 (!) 25 13  Temp:      TempSrc:      SpO2: 92% 91% 91% 91%  Weight:      Height:        Intake/Output Summary (Last 24 hours) at 04/03/2019 1112 Last data filed at 04/03/2019 1000 Gross per 24 hour  Intake 760 ml  Output 4500 ml  Net -3740 ml   Filed Weights   04/01/19 1235 04/02/19 0500 04/02/19 1630  Weight: 96.7 kg 90.2 kg 85.7 kg    Examination:   General: deconditioned  Neurology: Awake and alert, non focal  E ENT: mild  pallor, no icterus, oral mucosa moist Cardiovascular: No JVD. S1-S2 present, rhythmic, no gallops, rubs, or murmurs. Trace non pitting lower extremity edema. Pulmonary: positive breath sounds bilaterally, adequate air movement, no wheezing, rhonchi or rales. Gastrointestinal. Abdomen with no organomegaly, non tender, no rebound or guarding Skin. No rashes Musculoskeletal: no joint deformities Right IJ HD catheter.     Data Reviewed: I have personally reviewed following labs and imaging studies  CBC: Recent Labs  Lab 03/29/19 1135  03/30/19 0611  03/30/19 2339 03/31/19 0350 04/01/19 0529 04/02/19 0523 04/03/19 0506  WBC 14.5*   < > 13.9*  --   --  16.4* 14.9* 16.2* 13.6*  NEUTROABS 13.8*  --   --   --   --   --   --   --   --   HGB 4.8*   < > 8.3*   < > 8.2* 8.3* 7.4* 8.0* 8.3*  HCT 13.8*   < > 24.3*   < > 24.2* 24.4* 22.0* 23.7* 25.2*  MCV 87.9   < > 90.7  --   --  89.7 90.5 90.8 92.3  PLT 148*   < > 101*  --   --  131* 153 195 230   < > = values in this interval not displayed.   Basic Metabolic Panel: Recent Labs  Lab 03/30/19 0807 03/31/19 0350 03/31/19 1304 04/01/19 0529 04/02/19 0523 04/03/19 0506  NA 135 136  --  133* 134* 132*  K 3.6 4.6  --  3.8 3.8 3.3*  CL 101 98  --  99 98 97*  CO2 19* 17*  --  19* 24 26  GLUCOSE 86 87  --  91 102* 122*  BUN 118* 101*  --  64* 32* 21*  CREATININE 17.19* 17.91*  --  13.16* 8.28* 6.98*  CALCIUM 7.3* 7.7* 7.5* 7.7* 7.7* 7.7*    PHOS 7.3*  --  10.6*  --   --   --    GFR: Estimated Creatinine Clearance: 18.5 mL/min (A) (by C-G formula based on SCr of 6.98 mg/dL (H)). Liver Function Tests: Recent Labs  Lab 03/29/19 1135 03/30/19 0807  AST 20  --   ALT 30  --   ALKPHOS 57  --   BILITOT 0.7  --   PROT 3.6*  --   ALBUMIN 1.9* 2.5*   No results for input(s): LIPASE, AMYLASE in the last 168 hours. No results for input(s): AMMONIA in the last 168 hours. Coagulation Profile: Recent Labs  Lab 03/29/19 1321  INR 1.4*   Cardiac Enzymes: No results for input(s): CKTOTAL, CKMB, CKMBINDEX, TROPONINI in the last 168 hours. BNP (last 3 results) No results for input(s): PROBNP in the last 8760 hours. HbA1C: No results for input(s): HGBA1C in the last 72 hours. CBG: Recent Labs  Lab 03/31/19 2033 03/31/19 2333 04/01/19 0337 04/01/19 0725 04/03/19 0759  GLUCAP 92 86 89 81 103*   Lipid Profile: No results for input(s): CHOL, HDL, LDLCALC, TRIG, CHOLHDL, LDLDIRECT in the last 72 hours. Thyroid Function Tests: No results for input(s): TSH, T4TOTAL, FREET4, T3FREE, THYROIDAB in the last 72 hours. Anemia Panel: Recent Labs    04/02/19 0812  FERRITIN 458*  TIBC 137*  IRON 21*      Radiology Studies: I have reviewed all of the imaging during this hospital visit personally     Scheduled Meds:  amLODipine  10 mg Oral Daily   carvedilol  25 mg Oral BID WC   chlorhexidine  15 mL Mouth Rinse BID   Chlorhexidine Gluconate Cloth  6 each Topical Daily   darbepoetin (ARANESP) injection - DIALYSIS  100 mcg Intravenous Q Thu-HD   hydrALAZINE  100 mg Oral Q8H   mouth rinse  15 mL Mouth Rinse q12n4p   pantoprazole  40 mg Oral BID AC   sodium chloride flush  10-40 mL Intracatheter Q12H   Continuous Infusions:  sodium chloride     sodium chloride     [START ON 04/04/2019]  ceFAZolin (ANCEF) IV       LOS: 5 days        Davonne Baby Gerome Apley, MD

## 2019-04-03 NOTE — Progress Notes (Signed)
Report called to receiving RN. Pt is transferred to 2W09 via wheelchair. VSS. A/Ox3.  All questions answered. All belongings taken with patient.

## 2019-04-03 NOTE — Evaluation (Signed)
Physical Therapy Evaluation Patient Details Name: [name] MRN: [id number] DOB: 07-Feb-1991 Today's Date: 04/03/2019   History of Present Illness  28 year old male who presented with uncontrolled hypertension.  He does have chronic kidney disease stage V, hypertension and history of prior CVA.  Patient reported 5 days of nausea, vomiting and diffuse abdominal pain, in the emergency department [facility]) his blood pressure was more than 200/100, and had a witnessed generalized seizure.  Was diagnosed with hypertensive emergency and placed on nicardipine infusion.  While hospitalized he developed hematemesis and hematochezia.  EGD showed diffuse gastritis and single gastric ulcer actively bleeding.  Status post 3 clips.  Sigmoidoscopy with diffuse friable edematous tissue.  He had a worsening renal function and he was started on hemodialysis per right internal jugular vein hemodialysis catheter. Patient was transferred to [facility]  Clinical Impression   Pt admitted with above diagnosis. Pt currently with functional limitations due to the deficits listed below (see PT Problem List). Independent prior to this admission; Overall, managing mobility and transfers well; I anticipate good progress with functional mobility;  Pt will benefit from skilled PT to increase their independence and safety with mobility to allow discharge to the venue listed below.       Follow Up Recommendations No PT follow up    Equipment Recommendations  None recommended by PT    Recommendations for Other Services       Precautions / Restrictions Precautions Precaution Comments: Watch activity tolerance closely      Mobility  Bed Mobility Overal bed mobility: Modified Independent             General bed mobility comments: Slow, but without the need for physical assist  Transfers Overall transfer level: Needs assistance Equipment used: None Transfers: Sit to/from Stand Sit to Stand: Supervision          General transfer comment: Supervision mainly for lines  Ambulation/Gait Ambulation/Gait assistance: Min guard Gait Distance (Feet): (pivotal steps bed to recliner) Assistive device: None       General Gait Details: Cues to self-monitor for activity tolerance  Stairs            Wheelchair Mobility    Modified Rankin (Stroke Patients Only)       Balance                                             Pertinent Vitals/Pain Pain Assessment: No/denies pain    Home Living Family/patient expects to be discharged to:: Private residence Living Arrangements: Spouse/significant other Available Help at Discharge: Family;Available PRN/intermittently Type of Home: House Home Access: Stairs to enter Entrance Stairs-Rails: None Entrance Stairs-Number of Steps: 2 Home Layout: One level        Prior Function Level of Independence: Independent         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[employer], sport and exercise        Extremity/Trunk Assessment   Upper Extremity Assessment Upper Extremity Assessment: Overall WFL for tasks assessed(for simple tasks)    Lower Extremity Assessment Lower Extremity Assessment: Overall WFL for tasks assessed(for simple mobility)       Communication   Communication: No difficulties  Cognition Arousal/Alertness: Awake/alert Behavior During Therapy: WFL for tasks assessed/performed Overall Cognitive Status: Within Functional Limits for tasks assessed  General Comments General comments (skin integrity, edema, etc.): VSS; session conducted on supplementalO2 ; O2 sats dropped to 90%, but came back to near 100% at end of session; noted elevated troponins, consulted with ICU RNs who indicated to me low suspicion for Acute Coronary Syndrome, and ok'd OOB to chair activity    Exercises     Assessment/Plan    PT Assessment Patient needs continued PT services  PT Problem  List Decreased strength;Decreased activity tolerance;Decreased balance;Decreased mobility       PT Treatment Interventions DME instruction;Gait training;Stair training;Functional mobility training;Therapeutic activities;Therapeutic exercise;Patient/family education    PT Goals (Current goals can be found in the Care Plan section)  Acute Rehab PT Goals Patient Stated Goal: agreeable to getting OOB PT Goal Formulation: With patient Time For Goal Achievement: 04/17/19 Potential to Achieve Goals: Good    Frequency Min 3X/week   Barriers to discharge        Co-evaluation               AM-PAC PT "6 Clicks" Mobility  Outcome Measure Help needed turning from your back to your side while in a flat bed without using bedrails?: None Help needed moving from lying on your back to sitting on the side of a flat bed without using bedrails?: None Help needed moving to and from a bed to a chair (including a wheelchair)?: None Help needed standing up from a chair using your arms (e.g., wheelchair or bedside chair)?: None Help needed to walk in hospital room?: A Little Help needed climbing 3-5 steps with a railing? : A Little 6 Click Score: 22    End of Session   Activity Tolerance: Patient tolerated treatment well Patient left: in chair;with call bell/phone within reach Nurse Communication: Mobility status PT Visit Diagnosis: Other abnormalities of gait and mobility (R26.89)    Time: 1610-9604 PT Time Calculation (min) (ACUTE ONLY): 21 min   Charges:   PT Evaluation $PT Eval Moderate Complexity: [place] Pager [phone] Office [place] 04/03/2019, 3:24 PM

## 2019-04-03 NOTE — Progress Notes (Signed)
Admit: 03/29/2019 LOS: 5  82M with severe renal failure of unclear chronicity (Had SCr 1.4 11/2017) and severe anemia with gastrointestinal hemorrhage  Subjective:  . HD yesterday: 4.5L UF . Hb stable . 2L [place] this AM . Fevers resolved, no Cx data . BP still up on 3 meds; uncontrolled  07/25 0701 - 07/26 0700 In: 760 [P.O.:750; I.V.:10] Out: 4500   Filed Weights   04/01/19 1235 04/02/19 0500 04/02/19 1630  Weight: 96.7 kg 90.2 kg 85.7 kg    Scheduled Meds: . amLODipine  10 mg Oral Daily  . carvedilol  25 mg Oral BID WC  . chlorhexidine  15 mL Mouth Rinse BID  . Chlorhexidine Gluconate Cloth  6 each Topical Daily  . darbepoetin (ARANESP) injection - DIALYSIS  100 mcg Intravenous Q Thu-HD  . hydrALAZINE  50 mg Oral Q8H  . mouth rinse  15 mL Mouth Rinse q12n4p  . pantoprazole  40 mg Oral BID AC  . sodium chloride flush  10-40 mL Intracatheter Q12H   Continuous Infusions: . sodium chloride    . sodium chloride    . [START ON 04/04/2019]  ceFAZolin (ANCEF) IV     PRN Meds:.sodium chloride, sodium chloride, acetaminophen, alteplase, fentaNYL (SUBLIMAZE) injection, heparin, heparin, heparin, labetalol, lidocaine (PF), lidocaine-prilocaine, ondansetron (ZOFRAN) IV, oxyCODONE-acetaminophen, pentafluoroprop-tetrafluoroeth, sodium chloride flush  Current Labs: reviewed  Results for MUSTAF, ANTONACCI (MRN [id number]) as of 04/03/2019 10:21  Ref. Range 04/02/2019 08:12  Saturation Ratios Latest Ref Range: 17.9 - 39.5 % 15 (L)  Ferritin Latest Ref Range: 24 - 336 ng/mL 458 (H)   Results for OSA, CAMPOLI (MRN [id number]) as of 04/03/2019 10:21  Ref. Range 03/31/2019 13:04  PTH, Intact Latest Ref Range: 15 - 65 pg/mL 155 (H)   Physical Exam:  Blood pressure (!) 160/110, pulse (!) 101, temperature 99.6 F (37.6 C), temperature source Oral, resp. rate (!) 28, height 6\' 2"  (1.88 m), weight 85.7 kg, SpO2 91 %. GEN:  on [place], awake, conversant ENT: NCAT  EYES: EOMI CV: RRR, normal S1 and  S2 PULM:  Diminished in bases with some crackles, clear otherwise, normal work of breathing ABD:  Soft, nontender SKIN: No rashes or lesions, right IJ temporary dialysis catheter bandaged with no erythema or purulence EXT: 1+ peripheral edema  A 1. Anuric renal failure, unclear chronicity but favor chronic/longstanding: Started HD at outside facility; has R IJ Temp HD Cath; Likely ESRD but did have fairly normal GFR 11/2017 2. UGIB and LGIB, EGD 7/21 with esophageal ulcer, clipped, gastric ulcer; GI following; 3. ABLA Severe, stable;  4. HTN, uncontrolled 5. CKD BMD: P 10.6, Follow for now, no binders: PTH 155 stable  P  . VVS consult for [facility] and AV access . Inc hydralazine . Check Phosphorus . Tentative next HD 7/28 . F/u CLIP in AM, should go to [facility] . Needs more UF . Medication Issues; o Preferred narcotic agents for pain control are hydromorphone, fentanyl, and methadone. Morphine should not be used.  o Baclofen should be avoided o Avoid oral sodium phosphate and magnesium citrate based laxatives / bowel preps    Pearson Grippe MD 04/03/2019, 10:21 AM  Recent Labs  Lab 03/30/19 0807  03/31/19 1304 04/01/19 0529 04/02/19 0523 04/03/19 0506  NA 135   < >  --  133* 134* 132*  K 3.6   < >  --  3.8 3.8 3.3*  CL 101   < >  --  99 98 97*  CO2 19*   < >  --  19* 24 26  GLUCOSE 86   < >  --  91 102* 122*  BUN 118*   < >  --  64* 32* 21*  CREATININE 17.19*   < >  --  13.16* 8.28* 6.98*  CALCIUM 7.3*   < > 7.5* 7.7* 7.7* 7.7*  PHOS 7.3*  --  10.6*  --   --   --    < > = values in this interval not displayed.   Recent Labs  Lab 03/29/19 1135  04/01/19 0529 04/02/19 0523 04/03/19 0506  WBC 14.5*   < > 14.9* 16.2* 13.6*  NEUTROABS 13.8*  --   --   --   --   HGB 4.8*   < > 7.4* 8.0* 8.3*  HCT 13.8*   < > 22.0* 23.7* 25.2*  MCV 87.9   < > 90.5 90.8 92.3  PLT 148*   < > 153 195 230   < > = values in this interval not displayed.

## 2019-04-03 NOTE — Consult Note (Signed)
REASON FOR CONSULT:    The consult is for hemodialysis access.  The consult is requested by Dr. Pearson Grippe.  ASSESSMENT & PLAN:   END-STAGE RENAL DISEASE: His vein map is pending.  I will schedule him for a left arteriovenous fistula or arteriovenous graft on Monday if ok with primary service and GI.  We will also exchange his temporary catheter for a tunneled dialysis catheter.  Please do not dialyze him tomorrow if possible so as not to interfere with his OR schedule.  I have discussed the procedure and potential complications with the patient and he is agreeable to proceed.  Deitra Mayo, MD, FACS Beeper [phone] Office: [phone]   HPI:   [name] is a pleasant 28 y.o. male, who was admitted on 03/29/2019 with a hypertensive crisis, GI bleed, and questionable seizures.  This is a chronically ill 28 year old gentleman with poorly controlled blood pressure and stage V chronic kidney disease.  His hospital course was complicated by acute on chronic renal failure.  He is currently on dialysis via a catheter.  He was last dialyzed on 04/01/2019.  He has undergone work-up for his GI bleed including upper GI and lower GI.  EGD on 721 showed esophageal ulcer which was clipped and a gastric ulcer.  GI is following.  The patient is right-handed.  He does not have a pacemaker.  His renal insufficiency is related to poorly controlled blood pressure.  History reviewed. No pertinent past medical history.  History reviewed. No pertinent family history.  SOCIAL HISTORY: He tells me that he does smoke cigarettes on the weekend. Social History   Socioeconomic History  . Marital status: Single    Spouse name: Not on file  . Number of children: Not on file  . Years of education: Not on file  . Highest education level: Not on file  Occupational History  . Not on file  Social Needs  . Financial resource strain: Not on file  . Food insecurity    Worry: Not on file    Inability:  Not on file  . Transportation needs    Medical: Not on file    Non-medical: Not on file  Tobacco Use  . Smoking status: Not on file  Substance and Sexual Activity  . Alcohol use: Not on file  . Drug use: Not on file  . Sexual activity: Not on file  Lifestyle  . Physical activity    Days per week: Not on file    Minutes per session: Not on file  . Stress: Not on file  Relationships  . Social Herbalist on phone: Not on file    Gets together: Not on file    Attends religious service: Not on file    Active member of club or organization: Not on file    Attends meetings of clubs or organizations: Not on file    Relationship status: Not on file  . Intimate partner violence    Fear of current or ex partner: Not on file    Emotionally abused: Not on file    Physically abused: Not on file    Forced sexual activity: Not on file  Other Topics Concern  . Not on file  Social History Narrative  . Not on file    Not on File  Current Facility-Administered Medications  Medication Dose Route Frequency Provider Last Rate Last Dose  . 0.9 %  sodium chloride infusion  100 mL Intravenous PRN Chesley Mires, MD      .  0.9 %  sodium chloride infusion  100 mL Intravenous PRN Chesley Mires, MD      . acetaminophen (TYLENOL) tablet 650 mg  650 mg Oral Q6H PRN Chesley Mires, MD   650 mg at 04/01/19 1600  . alteplase (CATHFLO ACTIVASE) injection 2 mg  2 mg Intracatheter Once PRN Chesley Mires, MD      . amLODipine (NORVASC) tablet 10 mg  10 mg Oral Daily Chesley Mires, MD   10 mg at 04/02/19 0946  . carvedilol (COREG) tablet 25 mg  25 mg Oral BID WC Pearson Grippe B, MD   25 mg at 04/03/19 0837  . chlorhexidine (PERIDEX) 0.12 % solution 15 mL  15 mL Mouth Rinse BID Chesley Mires, MD   15 mL at 04/02/19 0947  . Chlorhexidine Gluconate Cloth 2 % PADS 6 each  6 each Topical Daily Sood, Vineet, MD      . Darbepoetin Alfa (ARANESP) injection 100 mcg  100 mcg Intravenous Q Thu-HD Chesley Mires, MD   100  mcg at 03/31/19 1525  . fentaNYL (SUBLIMAZE) injection 25 mcg  25 mcg Intravenous Q4H PRN Chesley Mires, MD      . heparin injection 1,000 Units  1,000 Units Dialysis PRN Chesley Mires, MD   2,800 Units at 03/31/19 1526  . heparin injection 1,000 Units  1,000 Units Dialysis PRN Chesley Mires, MD      . heparin injection 1,800 Units  20 Units/kg Dialysis PRN Pearson Grippe B, MD      . hydrALAZINE (APRESOLINE) tablet 50 mg  50 mg Oral Q8H Pearson Grippe B, MD   50 mg at 04/03/19 0630  . labetalol (NORMODYNE) injection 10 mg  10 mg Intravenous Q4H PRN Chesley Mires, MD   10 mg at 04/03/19 0630  . lidocaine (PF) (XYLOCAINE) 1 % injection 5 mL  5 mL Intradermal PRN Chesley Mires, MD      . lidocaine-prilocaine (EMLA) cream 1 application  1 application Topical PRN Chesley Mires, MD      . MEDLINE mouth rinse  15 mL Mouth Rinse q12n4p Chesley Mires, MD   15 mL at 04/02/19 1713  . ondansetron (ZOFRAN) injection 4 mg  4 mg Intravenous Q6H PRN Chesley Mires, MD   4 mg at 04/03/19 4580  . oxyCODONE-acetaminophen (PERCOCET/ROXICET) 5-325 MG per tablet 1 tablet  1 tablet Oral Q6H PRN Chesley Mires, MD   1 tablet at 04/03/19 0630  . pantoprazole (PROTONIX) EC tablet 40 mg  40 mg Oral BID AC Chesley Mires, MD   40 mg at 04/03/19 0837  . pentafluoroprop-tetrafluoroeth (GEBAUERS) aerosol 1 application  1 application Topical PRN Sood, Vineet, MD      . sodium chloride flush (NS) 0.9 % injection 10-40 mL  10-40 mL Intracatheter Q12H Chesley Mires, MD   10 mL at 04/02/19 2200  . sodium chloride flush (NS) 0.9 % injection 10-40 mL  10-40 mL Intracatheter PRN Chesley Mires, MD   10 mL at 04/01/19 1131    REVIEW OF SYSTEMS:  [X]  denotes positive finding, [ ]  denotes negative finding Cardiac  Comments:  Chest pain or chest pressure:    Shortness of breath upon exertion:    Short of breath when lying flat:    Irregular heart rhythm:        Vascular    Pain in calf, thigh, or hip brought on by ambulation:    Pain in feet at  night that wakes you up from your sleep:     Blood  clot in your veins:    Leg swelling:         Pulmonary    Oxygen at home:    Productive cough:     Wheezing:         Neurologic    Sudden weakness in arms or legs:     Sudden numbness in arms or legs:     Sudden onset of difficulty speaking or slurred speech:    Temporary loss of vision in one eye:     Problems with dizziness:         Gastrointestinal    Blood in stool:     Vomited blood:         Genitourinary    Burning when urinating:     Blood in urine:        Psychiatric    Major depression:         Hematologic    Bleeding problems:    Problems with blood clotting too easily:        Skin    Rashes or ulcers:        Constitutional    Fever or chills:     PHYSICAL EXAM:   Vitals:   04/03/19 0500 04/03/19 0700 04/03/19 0730 04/03/19 0800  BP: (!) 169/114 (!) 160/114 (!) 164/113 (!) 160/110  Pulse: 100 94 100 (!) 101  Resp: (!) 27 18 (!) 30 (!) 28  Temp:  99.6 F (37.6 C)    TempSrc:  Oral    SpO2: 94% 97% 92% 91%  Weight:      Height:        GENERAL: The patient is a well-nourished male, in no acute distress. The vital signs are documented above. CARDIAC: There is a regular rate and rhythm.  VASCULAR: I do not detect carotid bruits. He has palpable brachial and radial pulses bilaterally. He has an IV in his right arm. He has a temporary dialysis catheter in his right IJ. PULMONARY: There is good air exchange bilaterally without wheezing or rales. ABDOMEN: Soft and non-tender with normal pitched bowel sounds.  MUSCULOSKELETAL: There are no major deformities or cyanosis. NEUROLOGIC: No focal weakness or paresthesias are detected. SKIN: There are no ulcers or rashes noted. PSYCHIATRIC: The patient has a normal affect.  DATA:    VEIN MAP: Preoperative vein mapping has been ordered.   LABS: His GFR is 10.  Potassium is 3.3.  White blood cell count 13.6.  Hemoglobin 8.3.  VENOUS DUPLEX: He had a  venous duplex scan yesterday that showed no evidence of DVT in either lower extremity.

## 2019-04-03 NOTE — Progress Notes (Signed)
Patient tested Negative  For Sars-Cov-2 by PCR at [facility].  Document is the first page in patients paper chart.

## 2019-04-03 NOTE — Progress Notes (Signed)
CRITICAL VALUE ALERT  Critical Value:  Troponin I (high sensitivity) 1,234  Date & Time Notied:  04/03/19 @0747   Provider Notified: MD paged @0749   Orders Received/Actions taken: No orders received at this time. Will continue to monitor patient.

## 2019-04-04 ENCOUNTER — Encounter (HOSPITAL_COMMUNITY): Payer: Self-pay | Admitting: Surgery

## 2019-04-04 ENCOUNTER — Encounter (HOSPITAL_COMMUNITY)
Admission: EM | Disposition: A | Discharge status: Home / Self Care | Payer: Self-pay | Source: Other Acute Inpatient Hospital | Attending: Internal Medicine

## 2019-04-04 ENCOUNTER — Inpatient Hospital Stay (HOSPITAL_COMMUNITY): Payer: Medicaid Other

## 2019-04-04 ENCOUNTER — Inpatient Hospital Stay (HOSPITAL_COMMUNITY): Payer: Medicaid Other | Admitting: Anesthesiology

## 2019-04-04 DIAGNOSIS — K921 Melena: Secondary | ICD-10-CM

## 2019-04-04 DIAGNOSIS — N186 End stage renal disease: Secondary | ICD-10-CM

## 2019-04-04 DIAGNOSIS — Z992 Dependence on renal dialysis: Secondary | ICD-10-CM

## 2019-04-04 DIAGNOSIS — R079 Chest pain, unspecified: Secondary | ICD-10-CM

## 2019-04-04 HISTORY — PX: AV FISTULA PLACEMENT: SHX1204

## 2019-04-04 HISTORY — PX: INSERTION OF DIALYSIS CATHETER: SHX1324

## 2019-04-04 LAB — BASIC METABOLIC PANEL
Anion gap: 12 (ref 5–15)
BUN: 30 mg/dL — ABNORMAL HIGH (ref 6–20)
CO2: 25 mmol/L (ref 22–32)
Calcium: 7.8 mg/dL — ABNORMAL LOW (ref 8.9–10.3)
Chloride: 96 mmol/L — ABNORMAL LOW (ref 98–111)
Creatinine, Ser: 9.41 mg/dL — ABNORMAL HIGH (ref 0.61–1.24)
GFR calc Af Amer: 8 mL/min — ABNORMAL LOW (ref >60)
GFR calc non Af Amer: 7 mL/min — ABNORMAL LOW (ref >60)
Glucose, Bld: 101 mg/dL — ABNORMAL HIGH (ref 70–99)
Potassium: 3.3 mmol/L — ABNORMAL LOW (ref 3.5–5.1)
Sodium: 133 mmol/L — ABNORMAL LOW (ref 135–145)

## 2019-04-04 LAB — ECHOCARDIOGRAM COMPLETE
Height: 74 in
Weight: 3139.35 oz

## 2019-04-04 SURGERY — INSERTION OF ARTERIOVENOUS (AV) GORE-TEX GRAFT ARM
Anesthesia: General | Site: Chest | Laterality: Right

## 2019-04-04 MED ORDER — SALINE SPRAY 0.65 % NA SOLN
1.0000 | NASAL | Status: DC | PRN
  Administered 2019-04-04: 1 via NASAL
  Filled 2019-04-04: qty 44

## 2019-04-04 MED ORDER — MIDAZOLAM HCL 5 MG/5ML IJ SOLN
INTRAMUSCULAR | Status: DC | PRN
  Administered 2019-04-04: 2 mg via INTRAVENOUS

## 2019-04-04 MED ORDER — SODIUM CHLORIDE 0.9 % IV SOLN
INTRAVENOUS | Status: DC | PRN
  Administered 2019-04-04: 500 mL

## 2019-04-04 MED ORDER — MIDAZOLAM HCL 2 MG/2ML IJ SOLN
INTRAMUSCULAR | Status: AC
  Filled 2019-04-04: qty 2

## 2019-04-04 MED ORDER — FENTANYL CITRATE (PF) 250 MCG/5ML IJ SOLN
INTRAMUSCULAR | Status: AC
  Filled 2019-04-04: qty 5

## 2019-04-04 MED ORDER — HYDRALAZINE HCL 20 MG/ML IJ SOLN: 10.0000 mg | Freq: Once | INTRAMUSCULAR | Status: DC

## 2019-04-04 MED ORDER — DEXAMETHASONE SODIUM PHOSPHATE 10 MG/ML IJ SOLN
INTRAMUSCULAR | Status: AC
  Filled 2019-04-04: qty 1

## 2019-04-04 MED ORDER — FAMOTIDINE IN NACL 20-0.9 MG/50ML-% IV SOLN
20.0000 mg | Freq: Once | INTRAVENOUS | Status: AC
  Administered 2019-04-04: 20 mg via INTRAVENOUS
  Filled 2019-04-04: qty 50

## 2019-04-04 MED ORDER — HYDRALAZINE HCL 20 MG/ML IJ SOLN
INTRAMUSCULAR | Status: AC
  Administered 2019-04-04: 10 mg
  Filled 2019-04-04: qty 1

## 2019-04-04 MED ORDER — LIDOCAINE-EPINEPHRINE 0.5 %-1:200000 IJ SOLN
INTRAMUSCULAR | Status: AC
  Filled 2019-04-04: qty 1

## 2019-04-04 MED ORDER — ONDANSETRON HCL 4 MG/2ML IJ SOLN
INTRAMUSCULAR | Status: AC
  Filled 2019-04-04: qty 2

## 2019-04-04 MED ORDER — FENTANYL CITRATE (PF) 100 MCG/2ML IJ SOLN
INTRAMUSCULAR | Status: DC | PRN
  Administered 2019-04-04 (×3): 50 ug via INTRAVENOUS
  Administered 2019-04-04 (×2): 25 ug via INTRAVENOUS
  Administered 2019-04-04: 50 ug via INTRAVENOUS

## 2019-04-04 MED ORDER — LIDOCAINE 2% (20 MG/ML) 5 ML SYRINGE
INTRAMUSCULAR | Status: DC | PRN
  Administered 2019-04-04: 80 mg via INTRAVENOUS

## 2019-04-04 MED ORDER — FENTANYL CITRATE (PF) 100 MCG/2ML IJ SOLN
INTRAMUSCULAR | Status: AC
  Administered 2019-04-04: 16:00:00 50 ug via INTRAVENOUS
  Filled 2019-04-04: qty 2

## 2019-04-04 MED ORDER — ACETAMINOPHEN 160 MG/5ML PO SOLN: 1000.0000 mg | Freq: Once | ORAL | Status: DC | PRN

## 2019-04-04 MED ORDER — ACETAMINOPHEN 10 MG/ML IV SOLN: 1000.0000 mg | Freq: Once | INTRAVENOUS | Status: DC | PRN

## 2019-04-04 MED ORDER — SODIUM CHLORIDE 0.9 % IV SOLN
INTRAVENOUS | Status: AC
  Filled 2019-04-04: qty 1.2

## 2019-04-04 MED ORDER — PROPOFOL 10 MG/ML IV BOLUS
INTRAVENOUS | Status: DC | PRN
  Administered 2019-04-04: 30 mg via INTRAVENOUS
  Administered 2019-04-04: 200 mg via INTRAVENOUS

## 2019-04-04 MED ORDER — HEPARIN SODIUM (PORCINE) 1000 UNIT/ML IJ SOLN
INTRAMUSCULAR | Status: DC | PRN
  Administered 2019-04-04: 3400 [IU] via INTRAVENOUS
  Administered 2019-04-05: 1000 [IU]

## 2019-04-04 MED ORDER — LIDOCAINE 2% (20 MG/ML) 5 ML SYRINGE
INTRAMUSCULAR | Status: AC
  Filled 2019-04-04: qty 5

## 2019-04-04 MED ORDER — ONDANSETRON HCL 4 MG/2ML IJ SOLN
INTRAMUSCULAR | Status: DC | PRN
  Administered 2019-04-04: 4 mg via INTRAVENOUS

## 2019-04-04 MED ORDER — DEXAMETHASONE SODIUM PHOSPHATE 4 MG/ML IJ SOLN
INTRAMUSCULAR | Status: DC | PRN
  Administered 2019-04-04: 10 mg via INTRAVENOUS

## 2019-04-04 MED ORDER — ACETAMINOPHEN 500 MG PO TABS: 1000.0000 mg | ORAL_TABLET | Freq: Once | ORAL | Status: DC | PRN

## 2019-04-04 MED ORDER — ISOSORBIDE DINITRATE 30 MG PO TABS
30.0000 mg | ORAL_TABLET | Freq: Three times a day (TID) | ORAL | Status: DC
  Administered 2019-04-04 – 2019-04-06 (×7): 30 mg via ORAL
  Filled 2019-04-04 (×8): qty 1

## 2019-04-04 MED ORDER — SODIUM CHLORIDE 0.9 % IV SOLN
INTRAVENOUS | Status: DC
  Administered 2019-04-04: 13:00:00 via INTRAVENOUS

## 2019-04-04 MED ORDER — FENTANYL CITRATE (PF) 100 MCG/2ML IJ SOLN
25.0000 ug | INTRAMUSCULAR | Status: DC | PRN
  Administered 2019-04-04: 16:00:00 50 ug via INTRAVENOUS

## 2019-04-04 MED ORDER — METOPROLOL TARTRATE 5 MG/5ML IV SOLN
INTRAVENOUS | Status: AC
  Filled 2019-04-04: qty 5

## 2019-04-04 MED ORDER — OXYCODONE HCL 5 MG PO TABS: 5.0000 mg | ORAL_TABLET | Freq: Once | ORAL | Status: DC | PRN

## 2019-04-04 MED ORDER — OXYCODONE HCL 5 MG/5ML PO SOLN: 5.0000 mg | Freq: Once | ORAL | Status: DC | PRN

## 2019-04-04 MED ORDER — 0.9 % SODIUM CHLORIDE (POUR BTL) OPTIME
TOPICAL | Status: DC | PRN
  Administered 2019-04-04: 14:00:00 1000 mL

## 2019-04-04 MED ORDER — METOPROLOL TARTRATE 5 MG/5ML IV SOLN
INTRAVENOUS | Status: DC | PRN
  Administered 2019-04-04: 2.5 mg via INTRAVENOUS

## 2019-04-04 MED ORDER — HEPARIN SODIUM (PORCINE) 1000 UNIT/ML IJ SOLN
INTRAMUSCULAR | Status: AC
  Filled 2019-04-04: qty 1

## 2019-04-04 SURGICAL SUPPLY — 56 items
ARMBAND PINK RESTRICT EXTREMIT (MISCELLANEOUS) ×8 IMPLANT
BAG DECANTER FOR FLEXI CONT (MISCELLANEOUS) ×4 IMPLANT
BIOPATCH RED 1 DISK 7.0 (GAUZE/BANDAGES/DRESSINGS) ×3 IMPLANT
BIOPATCH RED 1IN DISK 7.0MM (GAUZE/BANDAGES/DRESSINGS) ×1
CANISTER SUCT 3000ML PPV (MISCELLANEOUS) ×4 IMPLANT
CANNULA VESSEL 3MM 2 BLNT TIP (CANNULA) ×4 IMPLANT
CATH PALINDROME RT-P 15FX19CM (CATHETERS) IMPLANT
CATH PALINDROME RT-P 15FX23CM (CATHETERS) ×2 IMPLANT
CATH PALINDROME RT-P 15FX28CM (CATHETERS) IMPLANT
CATH PALINDROME RT-P 15FX55CM (CATHETERS) IMPLANT
CLIP LIGATING EXTRA MED SLVR (CLIP) ×4 IMPLANT
CLIP LIGATING EXTRA SM BLUE (MISCELLANEOUS) ×4 IMPLANT
COVER PROBE W GEL 5X96 (DRAPES) ×4 IMPLANT
COVER SURGICAL LIGHT HANDLE (MISCELLANEOUS) ×4 IMPLANT
COVER WAND RF STERILE (DRAPES) ×4 IMPLANT
DECANTER SPIKE VIAL GLASS SM (MISCELLANEOUS) ×4 IMPLANT
DERMABOND ADVANCED (GAUZE/BANDAGES/DRESSINGS) ×2
DERMABOND ADVANCED .7 DNX12 (GAUZE/BANDAGES/DRESSINGS) ×2 IMPLANT
DRAPE C-ARM 42X72 X-RAY (DRAPES) ×4 IMPLANT
DRAPE CHEST BREAST 15X10 FENES (DRAPES) ×4 IMPLANT
ELECT REM PT RETURN 9FT ADLT (ELECTROSURGICAL) ×4
ELECTRODE REM PT RTRN 9FT ADLT (ELECTROSURGICAL) ×2 IMPLANT
GAUZE 4X4 16PLY RFD (DISPOSABLE) ×4 IMPLANT
GAUZE SPONGE 4X4 12PLY STRL (GAUZE/BANDAGES/DRESSINGS) ×4 IMPLANT
GLOVE SS BIOGEL STRL SZ 7.5 (GLOVE) ×2 IMPLANT
GLOVE SUPERSENSE BIOGEL SZ 7.5 (GLOVE) ×2
GOWN STRL REUS W/ TWL LRG LVL3 (GOWN DISPOSABLE) ×6 IMPLANT
GOWN STRL REUS W/TWL LRG LVL3 (GOWN DISPOSABLE) ×6
KIT BASIN OR (CUSTOM PROCEDURE TRAY) ×4 IMPLANT
KIT TURNOVER KIT B (KITS) ×4 IMPLANT
NDL 18GX1X1/2 (RX/OR ONLY) (NEEDLE) ×2 IMPLANT
NDL HYPO 25GX1X1/2 BEV (NEEDLE) ×2 IMPLANT
NEEDLE 18GX1X1/2 (RX/OR ONLY) (NEEDLE) ×4 IMPLANT
NEEDLE 22X1 1/2 (OR ONLY) (NEEDLE) IMPLANT
NEEDLE HYPO 25GX1X1/2 BEV (NEEDLE) ×4 IMPLANT
NS IRRIG 1000ML POUR BTL (IV SOLUTION) ×4 IMPLANT
PACK CV ACCESS (CUSTOM PROCEDURE TRAY) ×4 IMPLANT
PACK SURGICAL SETUP 50X90 (CUSTOM PROCEDURE TRAY) ×4 IMPLANT
PAD ARMBOARD 7.5X6 YLW CONV (MISCELLANEOUS) ×8 IMPLANT
SOAP 2 % CHG 4 OZ (WOUND CARE) ×4 IMPLANT
SUT ETHILON 3 0 PS 1 (SUTURE) ×4 IMPLANT
SUT PROLENE 5 0 C 1 24 (SUTURE) ×4 IMPLANT
SUT PROLENE 6 0 CC (SUTURE) ×8 IMPLANT
SUT SILK 2 0 PERMA HAND 18 BK (SUTURE) IMPLANT
SUT VIC AB 3-0 SH 27 (SUTURE) ×4
SUT VIC AB 3-0 SH 27X BRD (SUTURE) ×4 IMPLANT
SUT VICRYL 4-0 PS2 18IN ABS (SUTURE) ×4 IMPLANT
SYR 10ML LL (SYRINGE) ×4 IMPLANT
SYR 20ML LL LF (SYRINGE) ×4 IMPLANT
SYR 5ML LL (SYRINGE) ×8 IMPLANT
SYR CONTROL 10ML LL (SYRINGE) ×4 IMPLANT
SYR TOOMEY 50ML (SYRINGE) IMPLANT
TOWEL GREEN STERILE (TOWEL DISPOSABLE) ×8 IMPLANT
TOWEL GREEN STERILE FF (TOWEL DISPOSABLE) ×4 IMPLANT
UNDERPAD 30X30 (UNDERPADS AND DIAPERS) ×4 IMPLANT
WATER STERILE IRR 1000ML POUR (IV SOLUTION) ×4 IMPLANT

## 2019-04-04 NOTE — Anesthesia Preprocedure Evaluation (Signed)
Anesthesia Evaluation  Patient identified by MRN, date of birth, ID band Patient awake    Reviewed: Allergy & Precautions, NPO status , Patient's Chart, lab work & pertinent test results  History of Anesthesia Complications Negative for: history of anesthetic complications  Airway Mallampati: II  TM Distance: >3 FB Neck ROM: Full    Dental  (+) Dental Advisory Given, Poor Dentition, Missing, Chipped,    Pulmonary neg shortness of breath, neg COPD, neg recent URI, Current Smoker,    breath sounds clear to auscultation       Cardiovascular hypertension, Pt. on medications (-) angina(-) Past MI and (-) CHF  Rhythm:Regular     Neuro/Psych Seizures -,  negative psych ROS   GI/Hepatic PUD,   Endo/Other  negative endocrine ROS  Renal/GU ESRF and DialysisRenal disease     Musculoskeletal   Abdominal   Peds  Hematology  (+) anemia ,   Anesthesia Other Findings   Reproductive/Obstetrics                             Anesthesia Physical Anesthesia Plan  ASA: IV  Anesthesia Plan: General   Post-op Pain Management:    Induction: Intravenous  PONV Risk Score and Plan: 1 and Ondansetron and Dexamethasone  Airway Management Planned: LMA  Additional Equipment: None  Intra-op Plan:   Post-operative Plan: Extubation in OR  Informed Consent: I have reviewed the patients History and Physical, chart, labs and discussed the procedure including the risks, benefits and alternatives for the proposed anesthesia with the patient or authorized representative who has indicated his/her understanding and acceptance.       Plan Discussed with: CRNA and Surgeon  Anesthesia Plan Comments:         Anesthesia Quick Evaluation

## 2019-04-04 NOTE — Progress Notes (Signed)
PROGRESS NOTE    [name]  PPI:[id number] DOB: [birth year] DOA: 03/29/2019 PCP: Default, Provider, MD    Brief Narrative:  28 year old male who presented with uncontrolled hypertension.  He does have chronic kidney disease stage V, hypertension and history of prior CVA.  Patient reported 5 days of nausea, vomiting and diffuse abdominal pain, in the emergency department [facility]) his blood pressure was more than 200/100, and had a witnessed generalized seizure.  Was diagnosed with hypertensive emergency and placed on nicardipine infusion.  While hospitalized he developed hematemesis and hematochezia.  EGD showed diffuse gastritis and single gastric ulcer actively bleeding.  Status post 3 clips.  Sigmoidoscopy with diffuse friable edematous tissue.  He had a worsening renal function and he was started on hemodialysis per right internal jugular vein hemodialysis catheter. Patient was transferred to [facility] for further evaluation.  Patient was admitted to the intensive care unit working diagnosis of hypertensive emergency complicated by upper GI bleed and acute kidney injury on chronic kidney disease.  By the time of his transfer he was still having hematemesis and hematochezia.  Patient underwent repeat endoscopy, that required intubation for airway protection (03/29/19).   EGD with nonbleeding esophageal ulcer, please replace.  Nonobstructing nonbleeding gastric ulcer with pigmented material.  Patient required 5 units packed red blood cells and 3 fresh frozen plasma.  Patient was liberated from mechanical ventilation July 22, remain on noninvasive mechanical ventilation until July 25.  Transfer to [facility] July 26.   July 27 had AV fistula placed for HD.    Assessment & Plan:   Active Problems:   Hypertensive emergency   AKI (acute kidney injury) ([place])   Melena   Hematemesis   Acute gastric ulcer with hemorrhage   Ulcer of esophagus with bleeding   1. Upper GI bleed, positive  esophageal and gastric ulcers/ acute blood loss anemia (upper gi bleed), sp 5 units PRBC and 3 FFP/. Patient continue to be hemodynamically stable, will continue pantoprazole bid, tolerating po well. Follow cell count in am.    2. Hypertensive emergency (present on admission). Blood pressure uncontrolled 173/ 133, will continue with hydralazine, isosorbide, carvedilol and amlodipine. Will increase isosorbide to 30 mg tid.   3. CKD stage V, now on HD/ anuric/ suspected hypertensive nephropathy. Patient today had AV fistula placed with plan for next HD on 04/05/19. Right IJ HD catheter until fistula mature.   4. Elevated troponin. Echocardiogram with preserved LV systolic function. Ruled out acute coronary syndrome.     5. Colitis. Sp biopsy at outside hospital.    DVT prophylaxis: scd   Code Status: full Family Communication: no family at the bedside  Disposition Plan/ discharge barriers: pending clinical improvement, will need outpatient HD unit.    Body mass index is 25.19 kg/m. Malnutrition Type:      Malnutrition Characteristics:      Nutrition Interventions:     RN Pressure Injury Documentation:     Consultants:   Nephrology   GI   Vascular surgery   Procedures:   Upper endoscopy  AV fistula   Antimicrobials:       Subjective: Patient examined in PACU, somnolent post procedure, no apparent pain.   Objective: Vitals:   04/03/19 2113 04/04/19 0050 04/04/19 0549 04/04/19 0810  BP: (!) 154/109 (!) 162/103  (!) 173/115  Pulse:    98  Resp:    16  Temp:    98.7 F (37.1 C)  TempSrc:    Oral  SpO2:  92%  Weight:   89 kg   Height:        Intake/Output Summary (Last 24 hours) at 04/04/2019 1242 Last data filed at 04/03/2019 2200 Gross per 24 hour  Intake 720 ml  Output -  Net 720 ml   Filed Weights   04/02/19 1630 04/03/19 2108 04/04/19 0549  Weight: 85.7 kg 89 kg 89 kg    Examination:   General: deconditioned  Neurology: Awake  and alert, non focal  E ENT: mild pallor, no icterus, oral mucosa moist Cardiovascular: No JVD. S1-S2 present, rhythmic, no gallops, rubs, or murmurs. No lower extremity edema. Pulmonary: positive breath sounds bilaterally, adequate air movement, no wheezing, rhonchi or rales. Gastrointestinal. Abdomen with no organomegaly, non tender, no rebound or guarding Skin. No rashes Musculoskeletal: no joint deformities     Data Reviewed: I have personally reviewed following labs and imaging studies  CBC: Recent Labs  Lab 03/29/19 1135  03/30/19 0611  03/31/19 0350 04/01/19 0529 04/02/19 0523 04/03/19 0506 04/03/19 1232  WBC 14.5*   < > 13.9*  --  16.4* 14.9* 16.2* 13.6*  --   NEUTROABS 13.8*  --   --   --   --   --   --   --   --   HGB 4.8*   < > 8.3*   < > 8.3* 7.4* 8.0* 8.3* 8.9*  HCT 13.8*   < > 24.3*   < > 24.4* 22.0* 23.7* 25.2* 26.7*  MCV 87.9   < > 90.7  --  89.7 90.5 90.8 92.3  --   PLT 148*   < > 101*  --  131* 153 195 230  --    < > = values in this interval not displayed.   Basic Metabolic Panel: Recent Labs  Lab 03/30/19 0807 03/31/19 0350 03/31/19 1304 04/01/19 0529 04/02/19 0523 04/03/19 0506 04/04/19 0451  NA 135 136  --  133* 134* 132* 133*  K 3.6 4.6  --  3.8 3.8 3.3* 3.3*  CL 101 98  --  99 98 97* 96*  CO2 19* 17*  --  19* 24 26 25   GLUCOSE 86 87  --  91 102* 122* 101*  BUN 118* 101*  --  64* 32* 21* 30*  CREATININE 17.19* 17.91*  --  13.16* 8.28* 6.98* 9.41*  CALCIUM 7.3* 7.7* 7.5* 7.7* 7.7* 7.7* 7.8*  PHOS 7.3*  --  10.6*  --   --  3.6  --    GFR: Estimated Creatinine Clearance: 13.7 mL/min (A) (by C-G formula based on SCr of 9.41 mg/dL (H)). Liver Function Tests: Recent Labs  Lab 03/29/19 1135 03/30/19 0807  AST 20  --   ALT 30  --   ALKPHOS 57  --   BILITOT 0.7  --   PROT 3.6*  --   ALBUMIN 1.9* 2.5*   No results for input(s): LIPASE, AMYLASE in the last 168 hours. No results for input(s): AMMONIA in the last 168 hours. Coagulation  Profile: Recent Labs  Lab 03/29/19 1321  INR 1.4*   Cardiac Enzymes: No results for input(s): CKTOTAL, CKMB, CKMBINDEX, TROPONINI in the last 168 hours. BNP (last 3 results) No results for input(s): PROBNP in the last 8760 hours. HbA1C: Recent Labs    04/03/19 1232  HGBA1C 5.1   CBG: Recent Labs  Lab 03/31/19 2033 03/31/19 2333 04/01/19 0337 04/01/19 0725 04/03/19 0759  GLUCAP 92 86 89 81 103*   Lipid Profile: No results for input(s):  CHOL, HDL, LDLCALC, TRIG, CHOLHDL, LDLDIRECT in the last 72 hours. Thyroid Function Tests: No results for input(s): TSH, T4TOTAL, FREET4, T3FREE, THYROIDAB in the last 72 hours. Anemia Panel: Recent Labs    04/02/19 0812  FERRITIN 458*  TIBC 137*  IRON 21*      Radiology Studies: I have reviewed all of the imaging during this hospital visit personally     Scheduled Meds: . [MAR Hold] amLODipine  10 mg Oral Daily  . [MAR Hold] carvedilol  25 mg Oral BID WC  . [MAR Hold] chlorhexidine  15 mL Mouth Rinse BID  . [MAR Hold] Chlorhexidine Gluconate Cloth  6 each Topical Daily  . [MAR Hold] darbepoetin (ARANESP) injection - DIALYSIS  100 mcg Intravenous Q Thu-HD  . [MAR Hold] hydrALAZINE  100 mg Oral Q8H  . [MAR Hold] isosorbide dinitrate  10 mg Oral TID  . [MAR Hold] mouth rinse  15 mL Mouth Rinse q12n4p  . [MAR Hold] pantoprazole  40 mg Oral BID AC  . [MAR Hold] sodium chloride flush  10-40 mL Intracatheter Q12H   Continuous Infusions: . [MAR Hold] sodium chloride    . [MAR Hold] sodium chloride    . sodium chloride 10 mL/hr at 04/04/19 1238  . [MAR Hold]  ceFAZolin (ANCEF) IV       LOS: 6 days         Gerome Apley, MD

## 2019-04-04 NOTE — Progress Notes (Signed)
Patient ID: [name], male   DOB: [birth year], 28 y.o.   MRN: [id number]  [facility] Progress Note   Assessment/ Plan:   1.  Anuric renal failure, dialysis dependent: Suspected to have likely progressed on to end-stage renal disease with rather rapid progression of renal dysfunction over the past year or so.  Plans for left upper arm AVF versus AVG today along with conversion to tunneled hemodialysis catheter.  With plan for next dialysis again tomorrow follow-up on status of outpatient dialysis unit placement. 2. History of upper GI bleed and lower GI bleed: With EGD showing gastric and esophageal ulcers-management per GI. 3. Anemia: Improving status post source control of acute blood loss and PRBCs. 4. CKD-MBD: Phosphorus level improving, calcium level at acceptable range.  Not on binder. 5. Nutrition: Currently n.p.o. for surgery, resume renal diet postoperatively. 6. Hypertension: Significantly elevated blood pressure noted, monitor with hemodialysis for additional adjustment of therapy.  Subjective:   Complaining of abdominal pain this morning after poor sleep overnight.   Objective:   BP (!) 162/103   Pulse 95   Temp 98.8 F (37.1 C) (Oral)   Resp 20   Ht 6\' 2"  (1.88 m)   Wt 89 kg   SpO2 91%   BMI 25.19 kg/m   Physical Exam: Gen: Sitting up uncomfortably in recliner CVS: Pulse regular rhythm, normal rate, S1 and S2 normal Resp: Poor inspiratory effort with decreased breath sounds over bases Abd: Soft, obese, tender over epigastric area Ext: Trace-1+ lower extremity edema  Labs: BMET Recent Labs  Lab 03/29/19 1135 03/29/19 1837 03/30/19 0807 03/31/19 0350 03/31/19 1304 04/01/19 0529 04/02/19 0523 04/03/19 0506 04/04/19 0451  NA 131* 130* 135 136  --  133* 134* 132* 133*  K 4.9 4.6 3.6 4.6  --  3.8 3.8 3.3* 3.3*  CL 100  --  101 98  --  99 98 97* 96*  CO2 14*  --  19* 17*  --  19* 24 26 25   GLUCOSE 118*  --  86 87  --  91 102* 122* 101*   BUN 143*  --  118* 101*  --  64* 32* 21* 30*  CREATININE 21.61*  --  17.19* 17.91*  --  13.16* 8.28* 6.98* 9.41*  CALCIUM 6.7*  --  7.3* 7.7* 7.5* 7.7* 7.7* 7.7* 7.8*  PHOS  --   --  7.3*  --  10.6*  --   --  3.6  --    CBC Recent Labs  Lab 03/29/19 1135  03/31/19 0350 04/01/19 0529 04/02/19 0523 04/03/19 0506 04/03/19 1232  WBC 14.5*   < > 16.4* 14.9* 16.2* 13.6*  --   NEUTROABS 13.8*  --   --   --   --   --   --   HGB 4.8*   < > 8.3* 7.4* 8.0* 8.3* 8.9*  HCT 13.8*   < > 24.4* 22.0* 23.7* 25.2* 26.7*  MCV 87.9   < > 89.7 90.5 90.8 92.3  --   PLT 148*   < > 131* 153 195 230  --    < > = values in this interval not displayed.   Medications:    . amLODipine  10 mg Oral Daily  . carvedilol  25 mg Oral BID WC  . chlorhexidine  15 mL Mouth Rinse BID  . Chlorhexidine Gluconate Cloth  6 each Topical Daily  . darbepoetin (ARANESP) injection - DIALYSIS  100 mcg Intravenous Q Thu-HD  . hydrALAZINE  100 mg Oral Q8H  . isosorbide dinitrate  10 mg Oral TID  . mouth rinse  15 mL Mouth Rinse q12n4p  . pantoprazole  40 mg Oral BID AC  . sodium chloride flush  10-40 mL Intracatheter Q12H   Elmarie Shiley, MD 04/04/2019, 8:09 AM

## 2019-04-04 NOTE — Transfer of Care (Signed)
Immediate Anesthesia Transfer of Care Note  Patient: Sahan Pen  Procedure(s) Performed: Creation of First Stage Basilic Vein Arteriovenous Fistula (Left Arm Upper) INSERTION OF DIALYSIS CATHETER (Right Chest)  Patient Location: PACU  Anesthesia Type:General  Level of Consciousness: drowsy  Airway & Oxygen Therapy: Patient Spontanous Breathing and Patient connected to face mask oxygen  Post-op Assessment: Report given to RN and Post -op Vital signs reviewed and stable  Post vital signs: Reviewed and stable  Last Vitals:  Vitals Value Taken Time  BP 149/104 04/04/19 1541  Temp    Pulse 96 04/04/19 1542  Resp 19 04/04/19 1542  SpO2 91 % 04/04/19 1542  Vitals shown include unvalidated device data.  Last Pain:  Vitals:   04/04/19 0810  TempSrc: Oral  PainSc:       Patients Stated Pain Goal: 1 (36/46/80 3212)  Complications: No apparent anesthesia complications

## 2019-04-04 NOTE — Op Note (Signed)
    OPERATIVE REPORT  DATE OF SURGERY: 04/04/2019  PATIENT: [name], 28 y.o. male MRN: [id number]  DOB: 03-05-1991  PRE-OPERATIVE DIAGNOSIS: End-stage renal disease  POST-OPERATIVE DIAGNOSIS:  Same  PROCEDURE: #1 placement of right IJ tunneled catheter, #2 left brachiobasilic AV fistula creation  SURGEON:  Curt Jews, M.D.  PHYSICIAN ASSISTANT: Laurence Slate, PA-C  ANESTHESIA: LMA  EBL: per anesthesia record  Total I/O In: 423.7 [I.V.:373.7; IV Piggyback:50] Out: 10 [Blood:10]  BLOOD ADMINISTERED: none  DRAINS: none  SPECIMEN: none  COUNTS CORRECT:  YES  PATIENT DISPOSITION:  PACU - hemodynamically stable  PROCEDURE DETAILS: Patient was taken the operating placed supine position where the area of the right and left neck and chest prepped draped you sterile fashion.  The patient had an indwelling temporary left IJ catheter.  The catheter was prepped as well.  A guidewire was passed through the existing catheter and the catheter was removed.  A dilator and peel-away sheath was passed over the guidewire down to the level of the right atrium.  This was confirmed with fluoroscopy.  A 23 cm catheter was positioned through the peel-away sheath and the peel-away sheath was removed.  The tips were placed to the level of the distal right atrium.  The catheter was brought through a subcutaneous tunnel through a separate stab incision and the 2 lm ports were attached.  Both lumens flushed and aspirated easily and were locked with 1000 unit/cc heparin.  The catheter was secured to the skin with a 3-0 nylon stitch and the entry site was closed with a 4 subcuticular Vicryl stitch.  Sterile dressing was applied  Attention was then turned to the left arm.  The veins were imaged with SonoSite ultrasound.  The cephalic vein had thrombus present above the antecubital space and was of small caliber.  The patient had a large caliber basilic vein.  There was branching just above the antecubital  space.  One branch came into approximation of the brachial artery and the other was more medially placed.  Incision was made over the antecubital space and carried down to isolate the brachial artery which was a large caliber and the more lateral branch towards the radial aspect of the antecubital space.  This was of good caliber.  The basilic vein branch was ligated distally and was divided and was gently dilated with heparinized saline was adequate for fistula creation.  Dissection was tented up under the incision and the other more proximal branch was ligated and divided.  The vein was brought into approximation with the brachial artery.  The artery was occluded proximally distally and was opened with 11 blade and extended longitudinally with Potts scissors.  The vein was cut to the appropriate length and was spatulated and sewn end-to-side to the artery with a running 6-0 Prolene suture.  Clamps were removed and excellent thrill was noted.  Wounds irrigated with saline.  Hemostasis with cautery.  Wounds were closed with 3-0 Vicryl in the subcutaneous and subcuticular tissue.  Sterile dressing was applied and the patient was transferred to the recovery room with chest x-ray pending   Rosetta Posner, M.D., [facility] 04/04/2019 3:45 PM

## 2019-04-04 NOTE — Interval H&P Note (Signed)
History and Physical Interval Note:  04/04/2019 1:14 PM  [name]  has presented today for surgery, with the diagnosis of ESRD.  The various methods of treatment have been discussed with the patient and family. After consideration of risks, benefits and other options for treatment, the patient has consented to  Procedure(s): INSERTION OF ARTERIOVENOUS (AV)  VS GORE-TEX GRAFT ARM (Left) INSERTION OF DIALYSIS CATHETER (N/A) as a surgical intervention.  The patient's history has been reviewed, patient examined, no change in status, stable for surgery.  I have reviewed the patient's chart and labs.  Questions were answered to the patient's satisfaction.     Curt Jews

## 2019-04-04 NOTE — Anesthesia Procedure Notes (Signed)
Procedure Name: LMA Insertion Date/Time: 04/04/2019 2:04 PM Performed by: Candis Shine, CRNA Pre-anesthesia Checklist: Patient identified, Emergency Drugs available, Suction available and Patient being monitored Patient Re-evaluated:Patient Re-evaluated prior to induction Oxygen Delivery Method: Circle System Utilized Preoxygenation: Pre-oxygenation with 100% oxygen Induction Type: IV induction LMA: LMA inserted LMA Size: 5.0 Number of attempts: 1 Placement Confirmation: positive ETCO2 Tube secured with: Tape Dental Injury: Teeth and Oropharynx as per pre-operative assessment

## 2019-04-04 NOTE — Progress Notes (Signed)
Renal Navigator met with patient at bedside to complete assessment in order to complete referral for OP HD treatment. Patient is currently uninsured, which may delay acceptance for OP treatment. Referral has been submitted to [facility] for [facility] clinic, which is closest to address given by patient: [address] [place], [place] [address].  Renal Navigator will follow closely and update Nephrologist and patient regarding acceptance and seat schedule.  Alphonzo Cruise,  Renal Navigator [phone]

## 2019-04-04 NOTE — Progress Notes (Signed)
msg sent to Dr.Bodenheimer about pt c/o nasal dryness Ocean Spr[employer] maybe?

## 2019-04-05 ENCOUNTER — Encounter (HOSPITAL_COMMUNITY): Payer: Self-pay | Admitting: Vascular Surgery

## 2019-04-05 LAB — BASIC METABOLIC PANEL
Anion gap: 15 (ref 5–15)
BUN: 44 mg/dL — ABNORMAL HIGH (ref 6–20)
CO2: 19 mmol/L — ABNORMAL LOW (ref 22–32)
Calcium: 7.9 mg/dL — ABNORMAL LOW (ref 8.9–10.3)
Chloride: 94 mmol/L — ABNORMAL LOW (ref 98–111)
Creatinine, Ser: 11.6 mg/dL — ABNORMAL HIGH (ref 0.61–1.24)
GFR calc Af Amer: 6 mL/min — ABNORMAL LOW (ref >60)
GFR calc non Af Amer: 5 mL/min — ABNORMAL LOW (ref >60)
Glucose, Bld: 143 mg/dL — ABNORMAL HIGH (ref 70–99)
Potassium: 4.2 mmol/L (ref 3.5–5.1)
Sodium: 128 mmol/L — ABNORMAL LOW (ref 135–145)

## 2019-04-05 LAB — CBC WITH DIFFERENTIAL/PLATELET
Abs Immature Granulocytes: 0.13 10*3/uL — ABNORMAL HIGH (ref 0.00–0.07)
Basophils Absolute: 0 10*3/uL (ref 0.0–0.1)
Basophils Relative: 0 %
Eosinophils Absolute: 0 10*3/uL (ref 0.0–0.5)
Eosinophils Relative: 0 %
HCT: 27.1 % — ABNORMAL LOW (ref 39.0–52.0)
Hemoglobin: 9 g/dL — ABNORMAL LOW (ref 13.0–17.0)
Immature Granulocytes: 1 %
Lymphocytes Relative: 6 %
Lymphs Abs: 0.6 10*3/uL — ABNORMAL LOW (ref 0.7–4.0)
MCH: 29.9 pg (ref 26.0–34.0)
MCHC: 33.2 g/dL (ref 30.0–36.0)
MCV: 90 fL (ref 80.0–100.0)
Monocytes Absolute: 0.5 10*3/uL (ref 0.1–1.0)
Monocytes Relative: 5 %
Neutro Abs: 9.1 10*3/uL — ABNORMAL HIGH (ref 1.7–7.7)
Neutrophils Relative %: 88 %
Platelets: 302 10*3/uL (ref 150–400)
RBC: 3.01 MIL/uL — ABNORMAL LOW (ref 4.22–5.81)
RDW: 14.9 % (ref 11.5–15.5)
WBC: 10.4 10*3/uL (ref 4.0–10.5)
nRBC: 0 % (ref 0.0–0.2)

## 2019-04-05 MED ORDER — SODIUM CHLORIDE 0.9 % IV SOLN
250.0000 mg | INTRAVENOUS | Status: DC
  Administered 2019-04-05: 250 mg via INTRAVENOUS
  Filled 2019-04-05: qty 20

## 2019-04-05 MED ORDER — HEPARIN SODIUM (PORCINE) 1000 UNIT/ML IJ SOLN
INTRAMUSCULAR | Status: AC
  Administered 2019-04-05: 1000 [IU]
  Filled 2019-04-05: qty 4

## 2019-04-05 NOTE — Progress Notes (Addendum)
PROGRESS NOTE    Akari Crysler  KDT:[id number] DOB: 1991/05/20 DOA: 03/29/2019 PCP: Default, Provider, MD    Brief Narrative:  28 year old male who presented with uncontrolled hypertension. He does have chronic kidney disease stage V, hypertension and history of prior CVA. Patient reported 5 days of nausea, vomiting and diffuse abdominal pain, in the emergency department [facility]) hisblood pressure was more than 200/100,and had a witnessed generalized seizure. Was diagnosed with hypertensive emergency and placed on nicardipine infusion. While hospitalized he developed hematemesis and hematochezia. EGD showed diffuse gastritis and single gastric ulcer actively bleeding. Status post 3 clips. Sigmoidoscopy with diffuse friable edematous tissue. He had a worsening renal function and he was started on hemodialysis per right internaljugular vein hemodialysis catheter. [facility].  Patient was admitted to the intensive care unit working diagnosis of hypertensive emergency complicated by upper GI bleed and acute kidney injury on chronic kidney disease.  By the time of his transferhe wasstill having hematemesis and hematochezia. Patient underwent repeat endoscopy, thatrequired intubationfor airway protection (03/29/19).  EGD with nonbleeding esophageal ulcer, please replace. Nonobstructing nonbleeding gastric ulcer with pigmented material. Patient required 5 units packed red blood cells and 3 fresh frozen plasma.  Patient was liberated from mechanical ventilation July 22,remain on noninvasive mechanical ventilation until July 25.  Transfer to [facility] July 26.  July 27 had AV fistula placed for HD   Assessment & Plan:   Active Problems:   Hypertensive emergency   AKI (acute kidney injury) ([place])   Melena   Hematemesis   Acute gastric ulcer with hemorrhage   Ulcer of esophagus with bleeding   1. Upper GI bleed, positive  esophageal and gastric ulcers/ acute blood loss anemia (upper gi bleed), sp 5 units PRBC and 3 FFP/. Tolerating po well, hgb and Hct at 9.0 and 27.1 Will continue with pantoprazole bid.     2. Hypertensive emergency (present on admission). diastolic continue to be in the 100's. Currently on  Hydralazine 100 tid, isosorbide 30 tid, carvedilol 25 bid and amlodipine 10 mg. Plan for HD with UF today, continue blood pressure monitoring.   3. CKD stage V, now on HD/ anuric/ suspected hypertensive nephropathy. New Hyponatremia. AV fistula in placed and now changed to HD tunneled catheter. For HD today. Follow with nephrology recommendations. Follow on renal panel in am.   4. Elevated troponin. Isolated troponin elevation, with no frank chest pain, further workup with echocardiogram with no wall motion abnormalities and preserved LV systolic function.   5. Colitis. Clinically resolved, pending biopsy result. [facility]).     DVT prophylaxis:scd Code Status:full Family Communication:no family at the bedside Disposition Plan/ discharge barriers:Possible dc in am, pending final nephrology recommendations.   Body mass index is 25.19 kg/m. Malnutrition Type:      Malnutrition Characteristics:      Nutrition Interventions:     RN Pressure Injury Documentation:     Consultants:   Nephrology   Vascular surgery   GI   Procedures:   Endoscopy   Left arm AV fistula  Right IJ tunneled HD catheter.   Antimicrobials:       Subjective: Patient feeling well, no dyspnea or chest pain, no nausea or vomiting, tolerating po well. Mild pain at the fistula site on left upper extremity.   Objective: Vitals:   04/04/19 1707 04/04/19 2037 04/05/19 0544 04/05/19 0727  BP: (!) 156/106 (!) 165/112 (!) 167/114 (!) 163/117  Pulse: (!) 101 (!) 102  (!) 115  Resp:  16 18    Temp: 98.3 F (36.8 C) 99 F (37.2 C)  98.8 F (37.1 C)  TempSrc: Oral Oral  Oral  SpO2:  98% 97%  92%  Weight:      Height:        Intake/Output Summary (Last 24 hours) at 04/05/2019 0819 Last data filed at 04/04/2019 2300 Gross per 24 hour  Intake 783.67 ml  Output 10 ml  Net 773.67 ml   Filed Weights   04/02/19 1630 04/03/19 2108 04/04/19 0549  Weight: 85.7 kg 89 kg 89 kg    Examination:   General: deconditioned  Neurology: Awake and alert, non focal  E ENT: mild pallor, no icterus, oral mucosa moist Cardiovascular: No JVD. S1-S2 present, rhythmic, no gallops, rubs, or murmurs. No lower extremity edema. Pulmonary: vesicular breath sounds bilaterally, adequate air movement, no wheezing, rhonchi or rales. Gastrointestinal. Abdomen with, no organomegaly, non tender, no rebound or guarding Skin. No rashes Musculoskeletal: no joint deformities Right IJ HD tunneled catheter Left upper extremity fistula.     Data Reviewed: I have personally reviewed following labs and imaging studies  CBC: Recent Labs  Lab 03/29/19 1135  03/31/19 0350 04/01/19 0529 04/02/19 0523 04/03/19 0506 04/03/19 1232 04/05/19 0547  WBC 14.5*   < > 16.4* 14.9* 16.2* 13.6*  --  10.4  NEUTROABS 13.8*  --   --   --   --   --   --  9.1*  HGB 4.8*   < > 8.3* 7.4* 8.0* 8.3* 8.9* 9.0*  HCT 13.8*   < > 24.4* 22.0* 23.7* 25.2* 26.7* 27.1*  MCV 87.9   < > 89.7 90.5 90.8 92.3  --  90.0  PLT 148*   < > 131* 153 195 230  --  302   < > = values in this interval not displayed.   Basic Metabolic Panel: Recent Labs  Lab 03/30/19 0807  03/31/19 1304 04/01/19 0529 04/02/19 0523 04/03/19 0506 04/04/19 0451 04/05/19 0547  NA 135   < >  --  133* 134* 132* 133* 128*  K 3.6   < >  --  3.8 3.8 3.3* 3.3* 4.2  CL 101   < >  --  99 98 97* 96* 94*  CO2 19*   < >  --  19* 24 26 25  19*  GLUCOSE 86   < >  --  91 102* 122* 101* 143*  BUN 118*   < >  --  64* 32* 21* 30* 44*  CREATININE 17.19*   < >  --  13.16* 8.28* 6.98* 9.41* 11.60*  CALCIUM 7.3*   < > 7.5* 7.7* 7.7* 7.7* 7.8* 7.9*  PHOS 7.3*  --   10.6*  --   --  3.6  --   --    < > = values in this interval not displayed.   GFR: Estimated Creatinine Clearance: 11.1 mL/min (A) (by C-G formula based on SCr of 11.6 mg/dL (H)). Liver Function Tests: Recent Labs  Lab 03/29/19 1135 03/30/19 0807  AST 20  --   ALT 30  --   ALKPHOS 57  --   BILITOT 0.7  --   PROT 3.6*  --   ALBUMIN 1.9* 2.5*   No results for input(s): LIPASE, AMYLASE in the last 168 hours. No results for input(s): AMMONIA in the last 168 hours. Coagulation Profile: Recent Labs  Lab 03/29/19 1321  INR 1.4*   Cardiac Enzymes: No results for input(s): CKTOTAL, CKMB, CKMBINDEX,  TROPONINI in the last 168 hours. BNP (last 3 results) No results for input(s): PROBNP in the last 8760 hours. HbA1C: Recent Labs    04/03/19 1232  HGBA1C 5.1   CBG: Recent Labs  Lab 03/31/19 2033 03/31/19 2333 04/01/19 0337 04/01/19 0725 04/03/19 0759  GLUCAP 92 86 89 81 103*   Lipid Profile: No results for input(s): CHOL, HDL, LDLCALC, TRIG, CHOLHDL, LDLDIRECT in the last 72 hours. Thyroid Function Tests: No results for input(s): TSH, T4TOTAL, FREET4, T3FREE, THYROIDAB in the last 72 hours. Anemia Panel: No results for input(s): VITAMINB12, FOLATE, FERRITIN, TIBC, IRON, RETICCTPCT in the last 72 hours.    Radiology Studies: I have reviewed all of the imaging during this hospital visit personally     Scheduled Meds: . amLODipine  10 mg Oral Daily  . carvedilol  25 mg Oral BID WC  . chlorhexidine  15 mL Mouth Rinse BID  . Chlorhexidine Gluconate Cloth  6 each Topical Daily  . darbepoetin (ARANESP) injection - DIALYSIS  100 mcg Intravenous Q Thu-HD  . hydrALAZINE  100 mg Oral Q8H  . isosorbide dinitrate  30 mg Oral TID  . mouth rinse  15 mL Mouth Rinse q12n4p  . pantoprazole  40 mg Oral BID AC  . sodium chloride flush  10-40 mL Intracatheter Q12H   Continuous Infusions: . sodium chloride    . sodium chloride    . sodium chloride 10 mL/hr at 04/04/19 1238      LOS: 7 days         Gerome Apley, MD

## 2019-04-05 NOTE — Discharge Instructions (Signed)
° °  [facility]  Discharge Instructions  AV Fistula or Graft Surgery for Dialysis Access  Please refer to the following instructions for your post-procedure care. Your surgeon or physician assistant will discuss any changes with you.  Activity  You may drive the day following your surgery, if you are comfortable and no longer taking prescription pain medication. Resume full activity as the soreness in your incision resolves.  Bathing/Showering  You may shower after you go home. Keep your incision dry for 48 hours. Do not soak in a bathtub, hot tub, or swim until the incision heals completely. You may not shower if you have a hemodialysis catheter.  Incision Care  Clean your incision with mild soap and water after 48 hours. Pat the area dry with a clean towel. You do not need a bandage unless otherwise instructed. Do not apply any ointments or creams to your incision. You may have skin glue on your incision. Do not peel it off. It will come off on its own in about one week. Your arm may swell a bit after surgery. To reduce swelling use pillows to elevate your arm so it is above your heart. Your doctor will tell you if you need to lightly wrap your arm with an ACE bandage.  Diet  Resume your normal diet. There are not special food restrictions following this procedure. In order to heal from your surgery, it is CRITICAL to get adequate nutrition. Your body requires vitamins, minerals, and protein. Vegetables are the best source of vitamins and minerals. Vegetables also provide the perfect balance of protein. Processed food has little nutritional value, so try to avoid this.  Medications  Resume taking all of your medications. If your incision is causing pain, you may take over-the counter pain relievers such as acetaminophen (Tylenol). If you were prescribed a stronger pain medication, please be aware these medications can cause nausea and constipation. Prevent  nausea by taking the medication with a snack or meal. Avoid constipation by drinking plenty of fluids and eating foods with high amount of fiber, such as fruits, vegetables, and grains.  Do not take Tylenol if you are taking prescription pain medications.  Follow up Your surgeon may want to see you in the office following your access surgery. If so, this will be arranged at the time of your surgery.  Please call us immediately for any of the following conditions:  Increased pain, redness, drainage (pus) from your incision site Fever of 101 degrees or higher Severe or worsening pain at your incision site Hand pain or numbness.  Reduce your risk of vascular disease:  Stop smoking. If you would like help, call [facility] at 1-800-QUIT-NOW (904) 136-0014) or [place] at [place] your cholesterol Maintain a desired weight Control your diabetes Keep your blood pressure down  Dialysis  It will take several weeks to several months for your new dialysis access to be ready for use. Your surgeon will determine when it is okay to use it. Your nephrologist will continue to direct your dialysis. You can continue to use your Permcath until your new access is ready for use.   04/05/2019 [name] [id number] September 02, 1991  Surgeon(s): Early, Arvilla Meres, MD  Procedure(s): Creation of First Stage Basilic Vein Arteriovenous Fistula INSERTION OF DIALYSIS CATHETER  x Do not stick fistula for 12 weeks    If you have any questions, please call the office at [phone].

## 2019-04-05 NOTE — Progress Notes (Signed)
Physical Therapy Treatment Patient Details Name: [name] MRN: [id number] DOB: 1990-11-20 Today's Date: 04/05/2019    History of Present Illness 28 year old male who presented with uncontrolled hypertension.  He does have chronic kidney disease stage V, hypertension and history of prior CVA.  Patient reported 5 days of nausea, vomiting and diffuse abdominal pain, in the emergency department [facility]) his blood pressure was more than 200/100, and had a witnessed generalized seizure.  Was diagnosed with hypertensive emergency and placed on nicardipine infusion.  While hospitalized he developed hematemesis and hematochezia.  EGD showed diffuse gastritis and single gastric ulcer actively bleeding.  Status post 3 clips.  Sigmoidoscopy with diffuse friable edematous tissue.  He had a worsening renal function and he was started on hemodialysis per right internal jugular vein hemodialysis catheter. Patient was transferred to [facility]    PT Comments    Pt progressing well with mobility. Supervision provided for transfers and ambulation 225 feet without AD. Steady gait noted. Pt fatigues quickly. Current POC remains appropriate.   Follow Up Recommendations  No PT follow up     Equipment Recommendations  None recommended by PT    Recommendations for Other Services       Precautions / Restrictions Precautions Precautions: Other (comment) Precaution Comments: Watch activity tolerance closely Restrictions Weight Bearing Restrictions: No    Mobility  Bed Mobility Overal bed mobility: Modified Independent             General bed mobility comments: no physical assist, HOB elevated  Transfers Overall transfer level: Needs assistance Equipment used: None Transfers: Sit to/from Stand Sit to Stand: Supervision         General transfer comment: supervision for safety  Ambulation/Gait Ambulation/Gait assistance: Min guard Gait Distance (Feet): 225 Feet Assistive device: None Gait  Pattern/deviations: WFL(Within Functional Limits) Gait velocity: mildly decreased Gait velocity interpretation: 1.31 - 2.62 ft/sec, indicative of limited community ambulator General Gait Details: steady gait. Fatigues quickly   Marine scientist [place] (Stroke Patients Only)       Balance Overall balance assessment: No apparent balance deficits (not formally assessed)                                          Cognition Arousal/Alertness: Awake/alert Behavior During Therapy: WFL for tasks assessed/performed Overall Cognitive Status: Within Functional Limits for tasks assessed                                        Exercises      General Comments General comments (skin integrity, edema, etc.): ambulated on RA with SpO2 > 90%.      Pertinent Vitals/Pain Pain Assessment: Faces Faces Pain Scale: Hurts a little bit Pain Location: LUE (due to fistula placement 7/27) Pain Descriptors / Indicators: Sore Pain Intervention(s): Monitored during session    Home Living                      Prior Function            PT Goals (current goals can now be found in the care plan section) Acute Rehab PT Goals Patient Stated Goal: home PT Goal Formulation: With patient Time For Goal Achievement:  04/17/19 Potential to Achieve Goals: Good Progress towards PT goals: Progressing toward goals    Frequency    Min 3X/week      PT Plan Current plan remains appropriate    Co-evaluation              AM-PAC PT "6 Clicks" Mobility   Outcome Measure  Help needed turning from your back to your side while in a flat bed without using bedrails?: None Help needed moving from lying on your back to sitting on the side of a flat bed without using bedrails?: None Help needed moving to and from a bed to a chair (including a wheelchair)?: None Help needed standing up from a chair using your arms (e.g.,  wheelchair or bedside chair)?: None Help needed to walk in hospital room?: A Little Help needed climbing 3-5 steps with a railing? : A Little 6 Click Score: 22    End of Session Equipment Utilized During Treatment: Gait belt Activity Tolerance: Patient tolerated treatment well Patient left: in bed;with call bell/phone within reach Nurse Communication: Mobility status PT Visit Diagnosis: Other abnormalities of gait and mobility (R26.89)     Time: 3568-6168 PT Time Calculation (min) (ACUTE ONLY): 12 min  Charges:  $Gait Training: 8-22 mins                     [name], PT  Office # [phone] Pager [phone]    [name] 04/05/2019, 9:42 AM

## 2019-04-05 NOTE — Progress Notes (Signed)
Discussed with pt for 20 min about different types of foods that aren't good to eat on a Renal diet, how to count Sodium and Potassium intake for the day by using the labels on the cans or bottles, about watching his liquid intake, and his salt intake when using it on food.Instructed pt to talk to the doctor today about what kind of diet he's going to be on and that he needs to see a dietician before he leaves since this is the first time he'll be home as a dialysis pt. Instructed pt to write down any questions he has for the doctors and the dietician so that he can make sure he knows what he needs to know or at least the basics. Pt voiced understanding but needs a lot of instructions gone over and written down. Pt remains anxious and nervous about dialysis.

## 2019-04-05 NOTE — Progress Notes (Signed)
  Postoperative hemodialysis access     Date of Surgery:  04/04/2019 Surgeon: Early  Subjective:  Says he's a little sore at the incision.  Denies pain in his hand  PHYSICAL EXAMINATION:  Vitals:   04/05/19 0544 04/05/19 0727  BP: (!) 167/114 (!) 163/117  Pulse:  (!) 115  Resp:    Temp:  98.8 F (37.1 C)  SpO2:  92%    Incision is clean and dry Sensation in digits is intact;  There is  Thrill  There is bruit. The fistula is palpable  Left radial pulse is easily palpable and left hand grip is strong   ASSESSMENT/PLAN:  [name] is a 28 y.o. 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  -graft/fistula is patent -pt does not have evidence of steal sx as he denies pain in his left hand and his left radial pulse is easily palpable.  -f/u with Dr. Donnetta Hutching in 4-6 weeks to check maturation of AVF with duplex and discuss 2nd stage BVT.  Our office will call him to arrange appt.  -will sign off-call as needed.   Leontine Locket, PA-C [facility] [phone]

## 2019-04-05 NOTE — Procedures (Signed)
Patient seen on Hemodialysis. BP (!) 169/102   Pulse (!) 114   Temp 98.2 F (36.8 C) (Oral)   Resp (!) 35   Ht 6\' 2"  (1.88 m)   Wt 89 kg   SpO2 92%   BMI 25.19 kg/m   QB 300, UF goal 3.5L Tolerating treatment without complaints at this time.   Elmarie Shiley MD [facility]. Office # [phone] Pager # [phone] 1:37 PM

## 2019-04-05 NOTE — Progress Notes (Signed)
Patient ID: [name], male   DOB: 1990/12/01, 28 y.o.   MRN: [id number]  [facility] Progress Note   Assessment/ Plan:   1.  End-stage renal disease: With history and data supporting rapid progression of chronic kidney disease over the past year likely with uncontrolled hypertension being the main driver-now hemodialysis dependent.  The process has been initiated for outpatient dialysis unit placement with the barrier being his lack of insurance.  He is now status post left first stage BBF and right IJ TDC by Dr. Donnetta Hutching yesterday.  Will undertake hemodialysis today and continue him on a TTS schedule.  Will have dietitian see him for more information regarding renal diet. 2. History of upper GI bleed and lower GI bleed: With EGD showing gastric and esophageal ulcers-management per GI with PPI. 3. Anemia: Improving status post source control of acute blood loss and PRBCs. 4. CKD-MBD: Phosphorus level improving, calcium level at acceptable range.  Not on binder. 5. Nutrition: Currently n.p.o. for surgery, resume renal diet postoperatively. 6. Hypertension: Significantly elevated blood pressure noted, monitor with hemodialysis for additional adjustment of therapy.  I suspect we may be able to transition him to ACE inhibitor or ARB from hydralazine/Imdur as this is consistent with CKD/ESRD rather than AKI.  Subjective:   Complaining of abdominal pain this morning after poor sleep overnight.   Objective:   BP (!) 163/117   Pulse (!) 115   Temp 98.8 F (37.1 C) (Oral)   Resp 18   Ht 6\' 2"  (1.88 m)   Wt 89 kg   SpO2 92%   BMI 25.19 kg/m   Physical Exam: Gen: Resting comfortably in bed, watching television CVS: Pulse regular rhythm, normal rate, S1 and S2 normal Resp: Anteriorly clear to auscultation, no distinct rales or rhonchi.  Right IJ TDC Abd: Soft, obese, tender over epigastric area Ext: Trace-1+ lower extremity edema.  Left radial basilic fistula with palpable  thrill.  Labs: BMET Recent Labs  Lab 03/30/19 0807 03/31/19 0350 03/31/19 1304 04/01/19 0529 04/02/19 0523 04/03/19 0506 04/04/19 0451 04/05/19 0547  NA 135 136  --  133* 134* 132* 133* 128*  K 3.6 4.6  --  3.8 3.8 3.3* 3.3* 4.2  CL 101 98  --  99 98 97* 96* 94*  CO2 19* 17*  --  19* 24 26 25  19*  GLUCOSE 86 87  --  91 102* 122* 101* 143*  BUN 118* 101*  --  64* 32* 21* 30* 44*  CREATININE 17.19* 17.91*  --  13.16* 8.28* 6.98* 9.41* 11.60*  CALCIUM 7.3* 7.7* 7.5* 7.7* 7.7* 7.7* 7.8* 7.9*  PHOS 7.3*  --  10.6*  --   --  3.6  --   --    CBC Recent Labs  Lab 03/29/19 1135  04/01/19 0529 04/02/19 0523 04/03/19 0506 04/03/19 1232 04/05/19 0547  WBC 14.5*   < > 14.9* 16.2* 13.6*  --  10.4  NEUTROABS 13.8*  --   --   --   --   --  9.1*  HGB 4.8*   < > 7.4* 8.0* 8.3* 8.9* 9.0*  HCT 13.8*   < > 22.0* 23.7* 25.2* 26.7* 27.1*  MCV 87.9   < > 90.5 90.8 92.3  --  90.0  PLT 148*   < > 153 195 230  --  302   < > = values in this interval not displayed.   Medications:    . amLODipine  10 mg Oral Daily  .  carvedilol  25 mg Oral BID WC  . chlorhexidine  15 mL Mouth Rinse BID  . Chlorhexidine Gluconate Cloth  6 each Topical Daily  . darbepoetin (ARANESP) injection - DIALYSIS  100 mcg Intravenous Q Thu-HD  . hydrALAZINE  100 mg Oral Q8H  . isosorbide dinitrate  30 mg Oral TID  . mouth rinse  15 mL Mouth Rinse q12n4p  . pantoprazole  40 mg Oral BID AC  . sodium chloride flush  10-40 mL Intracatheter Q12H   Elmarie Shiley, MD 04/05/2019, 10:14 AM

## 2019-04-05 NOTE — Anesthesia Postprocedure Evaluation (Signed)
Anesthesia Post Note  Patient: [name]  Procedure(s) Performed: Creation of First Stage Basilic Vein Arteriovenous Fistula (Left Arm Upper) INSERTION OF DIALYSIS CATHETER (Right Chest)     Patient location during evaluation: PACU Anesthesia Type: General Level of consciousness: awake and alert Pain management: pain level controlled Vital Signs Assessment: post-procedure vital signs reviewed and stable Respiratory status: spontaneous breathing, nonlabored ventilation, respiratory function stable and patient connected to nasal cannula oxygen Cardiovascular status: blood pressure returned to baseline and stable Postop Assessment: no apparent nausea or vomiting Anesthetic complications: no    Last Vitals:  Vitals:   04/05/19 0544 04/05/19 0727  BP: (!) 167/114 (!) 163/117  Pulse:  (!) 115  Resp:    Temp:  37.1 C  SpO2:  92%    Last Pain:  Vitals:   04/05/19 0730  TempSrc:   PainSc: 8                  Ambrea Hegler

## 2019-04-05 NOTE — Progress Notes (Signed)
Came into patients room at 1230 and patient said he was having trouble breathing. RN grabbed a dinamap and his HR was in the 120's and his oxygen was 83 on 2L. Bumped him up to 5L and paged the MD. MD called and said he wanted the patient to go ahead to the dialysis session which he was already scheduled for. The patient came up to 88% on 5L. RN put him on HFNC at 7L where he came up to 90% and transport took him to dialysis.   It should be noted that shortly before this episode the patient received his oxycodone for pain as well as he was told he was going to dialysis and he was tachypnic and tearful when I returned to check on him and when asked if he felt anxious he said yes and when asked if he wanted to talk about it he did not respond.

## 2019-04-06 ENCOUNTER — Inpatient Hospital Stay (HOSPITAL_COMMUNITY): Payer: Medicaid Other

## 2019-04-06 DIAGNOSIS — J9601 Acute respiratory failure with hypoxia: Secondary | ICD-10-CM

## 2019-04-06 LAB — GLUCOSE, CAPILLARY: Glucose-Capillary: 140 mg/dL — ABNORMAL HIGH (ref 70–99)

## 2019-04-06 MED ORDER — LORAZEPAM 1 MG PO TABS
1.0000 mg | ORAL_TABLET | ORAL | Status: DC | PRN
  Administered 2019-04-06: 1 mg via ORAL
  Filled 2019-04-06: qty 1

## 2019-04-06 MED ORDER — FUROSEMIDE 10 MG/ML IJ SOLN
INTRAMUSCULAR | Status: AC
  Administered 2019-04-06: 60 mg
  Filled 2019-04-06: qty 4

## 2019-04-06 MED ORDER — AMLODIPINE BESYLATE 10 MG PO TABS: 10.0000 mg | ORAL_TABLET | Freq: Every day | ORAL | 0 refills | Status: DC

## 2019-04-06 MED ORDER — HYDRALAZINE HCL 100 MG PO TABS: 100.0000 mg | ORAL_TABLET | Freq: Three times a day (TID) | ORAL | 0 refills | Status: DC

## 2019-04-06 MED ORDER — PANTOPRAZOLE SODIUM 40 MG PO TBEC: 40.0000 mg | DELAYED_RELEASE_TABLET | Freq: Two times a day (BID) | ORAL | 0 refills | Status: DC

## 2019-04-06 MED ORDER — ISOSORBIDE DINITRATE 30 MG PO TABS: 30.0000 mg | ORAL_TABLET | Freq: Three times a day (TID) | ORAL | 0 refills | Status: DC

## 2019-04-06 MED ORDER — CARVEDILOL 25 MG PO TABS: 25.0000 mg | ORAL_TABLET | Freq: Two times a day (BID) | ORAL | 0 refills | Status: DC

## 2019-04-06 MED ORDER — LORAZEPAM 0.5 MG PO TABS
0.5000 mg | ORAL_TABLET | Freq: Once | ORAL | Status: AC
  Administered 2019-04-06: 0.5 mg via ORAL
  Filled 2019-04-06: qty 1

## 2019-04-06 MED ORDER — FUROSEMIDE 10 MG/ML IJ SOLN
INTRAMUSCULAR | Status: AC
  Filled 2019-04-06: qty 2

## 2019-04-06 MED ORDER — HEPARIN SODIUM (PORCINE) 1000 UNIT/ML DIALYSIS: 40.0000 [IU]/kg | INTRAMUSCULAR | Status: DC | PRN

## 2019-04-06 NOTE — Progress Notes (Signed)
Patient ID: [name], male   DOB: 24-Oct-1990, 28 y.o.   MRN: [id number]  [facility] Progress Note   Assessment/ Plan:   1.  End-stage renal disease: With history and data supporting rapid progression of chronic kidney disease over the past year likely with uncontrolled hypertension being the main driver-now hemodialysis dependent.  He has received financial approval for outpatient dialysis unit and can begin TTS schedule at [facility] starting tomorrow.  He is status post left first stage BBF and right IJ TDC by Dr. Donnetta Hutching on 7/27.  He underwent hemodialysis yesterday but had an episode of anxiety/dyspnea last night prompting a chest x-ray that showed pulmonary edema-we will undertake additional dialysis today prior to discharge. 2. History of upper GI bleed and lower GI bleed: With EGD showing gastric and esophageal ulcers-management per GI with PPI. 3. Anemia: Improving status post source control of acute blood loss and PRBCs. 4. CKD-MBD: Phosphorus level improving, calcium level at acceptable range.  Not on binder. 5. Nutrition: Currently n.p.o. for surgery, resume renal diet postoperatively. 6. Hypertension: Significantly elevated blood pressure noted, monitor with hemodialysis for additional adjustment of therapy.  I suspect we may be able to transition him to ACE inhibitor or ARB from hydralazine/Imdur as this is consistent with CKD/ESRD rather than AKI.  Subjective:   He had an episode of anxiety/dyspnea overnight requiring supplemental oxygen and will get hemodialysis today.   Objective:   BP (!) 157/115 (BP Location: Right Arm)   Pulse (!) 117   Temp 98.3 F (36.8 C) (Oral)   Resp (!) 28   Ht 6\' 2"  (1.88 m)   Wt 91.7 kg   SpO2 92%   BMI 25.96 kg/m   Physical Exam: Gen: Resting comfortably in bed, currently not on oxygen supplementation CVS: Pulse regular tachycardia, S1 and S2 normal Resp: Anteriorly clear to auscultation, no distinct  rales or rhonchi.  Right IJ TDC Abd: Soft, obese, nontender Ext: Trace-1+ lower extremity edema.  Left brachial basilic fistula with palpable thrill.  Labs: BMET Recent Labs  Lab 03/31/19 0350 03/31/19 1304 04/01/19 0529 04/02/19 0523 04/03/19 0506 04/04/19 0451 04/05/19 0547  NA 136  --  133* 134* 132* 133* 128*  K 4.6  --  3.8 3.8 3.3* 3.3* 4.2  CL 98  --  99 98 97* 96* 94*  CO2 17*  --  19* 24 26 25  19*  GLUCOSE 87  --  91 102* 122* 101* 143*  BUN 101*  --  64* 32* 21* 30* 44*  CREATININE 17.91*  --  13.16* 8.28* 6.98* 9.41* 11.60*  CALCIUM 7.7* 7.5* 7.7* 7.7* 7.7* 7.8* 7.9*  PHOS  --  10.6*  --   --  3.6  --   --    CBC Recent Labs  Lab 04/01/19 0529 04/02/19 0523 04/03/19 0506 04/03/19 1232 04/05/19 0547  WBC 14.9* 16.2* 13.6*  --  10.4  NEUTROABS  --   --   --   --  9.1*  HGB 7.4* 8.0* 8.3* 8.9* 9.0*  HCT 22.0* 23.7* 25.2* 26.7* 27.1*  MCV 90.5 90.8 92.3  --  90.0  PLT 153 195 230  --  302   Medications:    . amLODipine  10 mg Oral Daily  . carvedilol  25 mg Oral BID WC  . chlorhexidine  15 mL Mouth Rinse BID  . Chlorhexidine Gluconate Cloth  6 each Topical Daily  . darbepoetin (ARANESP) injection - DIALYSIS  100 mcg Intravenous  Q Thu-HD  . hydrALAZINE  100 mg Oral Q8H  . isosorbide dinitrate  30 mg Oral TID  . mouth rinse  15 mL Mouth Rinse q12n4p  . pantoprazole  40 mg Oral BID AC  . sodium chloride flush  10-40 mL Intracatheter Q12H   Elmarie Shiley, MD 04/06/2019, 11:58 AM

## 2019-04-06 NOTE — Progress Notes (Signed)
RN paged NP that pt had increased O2 requirement from 7L HFNC to 9L HFNC and was c/o SOB. NP ordered CXR and ABG. Pt became more anxious and refused ABG and NP went to bedside.  S: States is SOB ever since he tried to lay the [facility] down to sleep. Now that he is on a NRB, he is feeling better. Still anxious. No chest pain. States he still urinates "some". Had HD 7/28.  O: appears well. NAD. O2 sat 100% on NRB. BP 150s. HR slightly tachycardic. He is alert and oriented. Answers questions appropriately and speech is fluent without increased SOB. He appears somewhat anxious. He refused ABG. Abd soft. No LE edema. Skin is pink, dry.  A/P: 1. Increased O2 demand/acute respiratory distress-improved while NP at bedside. Continue NRB for now and wean O2 down as tolerated. CXR showed pulmonary edema so this is likely the reason for the issue. Since he still voids, we will try Lasix 60mg , but he may have to r/p HD today for overload.  2. Anxiety-felt to be secondary to hypoxia, but has continued after NRB. Ativan x one.   KJKG, NP [facility]

## 2019-04-06 NOTE — Progress Notes (Signed)
Physical Therapy Treatment Patient Details Name: [name] MRN: [id number] DOB: 1991-01-29 Today's Date: 04/06/2019    History of Present Illness 28 year old male who presented with uncontrolled hypertension.  He does have chronic kidney disease stage V, hypertension and history of prior CVA.  Patient reported 5 days of nausea, vomiting and diffuse abdominal pain, in the emergency department [facility]) his blood pressure was more than 200/100, and had a witnessed generalized seizure.  Was diagnosed with hypertensive emergency and placed on nicardipine infusion.  While hospitalized he developed hematemesis and hematochezia.  EGD showed diffuse gastritis and single gastric ulcer actively bleeding.  Status post 3 clips.  Sigmoidoscopy with diffuse friable edematous tissue.  He had a worsening renal function and he was started on hemodialysis per right internal jugular vein hemodialysis catheter. Patient was transferred to [facility]    PT Comments    Patient ambulating without assistance, desat on RA at rest to 80%, remains >93% on 2L. satting well on 3L with activity this visit, HR155max with 120' gait.      Follow Up Recommendations  No PT follow up     Equipment Recommendations  None recommended by PT    Recommendations for Other Services       Precautions / Restrictions Precautions Precaution Comments: Watch activity tolerance closely Restrictions Weight Bearing Restrictions: No    Mobility  Bed Mobility Overal bed mobility: Modified Independent             General bed mobility comments: Slow, but without the need for physical assist  Transfers Overall transfer level: Needs assistance Equipment used: None Transfers: Sit to/from Stand Sit to Stand: Supervision         General transfer comment: Supervision mainly for lines  Ambulation/Gait Ambulation/Gait assistance: Min guard Gait Distance (Feet): 120 Feet Assistive device: None   Gait velocity: mildly  decreased   General Gait Details: satting well on 3L   Stairs             Wheelchair Mobility    Modified Rankin (Stroke Patients Only)       Balance                                            Cognition Arousal/Alertness: Awake/alert Behavior During Therapy: WFL for tasks assessed/performed Overall Cognitive Status: Within Functional Limits for tasks assessed                                        Exercises      General Comments        Pertinent Vitals/Pain      Home Living                      Prior Function            PT Goals (current goals can now be found in the care plan section) Acute Rehab PT Goals Patient Stated Goal: agreeable to getting OOB PT Goal Formulation: With patient Time For Goal Achievement: 04/17/19 Potential to Achieve Goals: Good Progress towards PT goals: Progressing toward goals    Frequency    Min 3X/week      PT Plan      Co-evaluation              AM-PAC  PT "6 Clicks" Mobility   Outcome Measure  Help needed turning from your back to your side while in a flat bed without using bedrails?: None Help needed moving from lying on your back to sitting on the side of a flat bed without using bedrails?: None Help needed moving to and from a bed to a chair (including a wheelchair)?: None Help needed standing up from a chair using your arms (e.g., wheelchair or bedside chair)?: None Help needed to walk in hospital room?: A Little Help needed climbing 3-5 steps with a railing? : A Little 6 Click Score: 22    End of Session   Activity Tolerance: Patient tolerated treatment well Patient left: in chair;with call bell/phone within reach Nurse Communication: Mobility status PT Visit Diagnosis: Other abnormalities of gait and mobility (R26.89)     Time: 0900-0930 PT Time Calculation (min) (ACUTE ONLY): 30 min  Charges:  $Gait Training: 8-22 mins                      Reinaldo Berber, PT, DPT Acute Rehabilitation Services Pager: [phone] Office: [phone]     Reinaldo Berber 04/06/2019, 11:29 AM

## 2019-04-06 NOTE — Progress Notes (Signed)
RT attempted to get ABG from right radial.  Patient refusing ABG at this time.  RN contacting DR.  RT will continue to monitor.

## 2019-04-06 NOTE — Progress Notes (Signed)
Patient has been accepted for OP HD treatment at [facility] on a TTS schedule with a seat time of 12:45pm. He needs to arrive for appointments at 12:25pm.  Per Dr. Patel/Nephrology, patient can discharge after HD today (needed for volume removal) and therefore, patient will have first OP HD clinic appointment on 04/07/19. He needs to arrive for his first day of treatment at 11:45am to complete intake paperwork.  Renal Navigator met with patient in HD unit to notify and give all above information in writing. He states understanding. Patient spoke with Renal Navigator about anxiety he has begun to experience regarding being in the hospital. Renal Navigator provided supportive brief counseling as he discussed feelings regarding hospitalization, his illness, and starting dialysis. Renal Navigator provided tips on mindfullness and ways to cope with anxiety. Patient states commitment to complying with OP HD treatment and reports that he wants to take care of himself. He spoke about his mother dying when he was 63, never knowing his biological father, and his step-father leaving. He states he has always been "on my own." He reports "arguing" with his fiance, but also stating that this illness has caused them to grow closer and that he hope their relationship turns into "something more." He is concerned with his diet and with his high blood pressure and asked Renal Navigator who will help monitor these things and provide him with education. Renal Navigator informed him that there is a multidisciplinary treatment team at his clinic, including a physician and a dietician (among others) and suggests that he get a primary care doctor as well. Renal Navigator suggests calling the [facility] as a possibility. Patient was very appreciative of the information and Renal Navigator's time.  Patient is cleared for discharge from an OP HD standpoint. Nephrologist, patient and OP HD  clinic aware.  [facility] [place] [address] [place] [phone]  Alphonzo Cruise, [facility] Renal Navigator [phone]

## 2019-04-06 NOTE — Progress Notes (Signed)
On call provider Baltazar Najjar NP paged re: increasing restlessness, pt states he is very worried/nervous.

## 2019-04-06 NOTE — Discharge Summary (Signed)
Physician Discharge Summary  Rishan Oyama HFW:[id number] DOB: 1991-01-07 DOA: 03/29/2019  PCP: Default, Provider, MD  Admit date: 03/29/2019 Discharge date: 04/06/2019  Admitted From: Home  Disposition:  Home   Recommendations for Outpatient Follow-up and new medication changes:  1. Follow up with Primary Care in 7 days.  2. Patient has been placed on amlodipine, hydralazine, isosorbide and carvedilol for blood pressure control.  Will need further adjustment as an outpatient. 3. Will resume hemodialysis in am as outpatient. 4. Sigmoidoscopy biopsy result is pending ([place]).      Home Health: no   Equipment/Devices: no    Discharge Condition: stable  CODE STATUS: full  Diet recommendation: heart healthy, renal prudent.   Brief/Interim Summary: 28 year old male who presented with uncontrolled hypertension. He does have chronic kidney disease stage V, hypertension and history of prior CVA. Patient reported 5 days of nausea, vomiting and diffuse abdominal pain, in the emergency department [facility]) hisblood pressure was more than 200/100,and had a witnessed generalized seizure. He was diagnosed with hypertensive emergency and placed on nicardipine infusion. While hospitalized he developed hematemesis and hematochezia. EGD showed diffuse gastritis and single gastric ulcer actively bleeding. Status post 3 clips. Sigmoidoscopy with diffuse friable edematous tissue. He had a worsening renal function and he was started on hemodialysis per right internaljugular vein hemodialysis catheter. [facility].  Patient was admitted to the intensive care unit working diagnosis of hypertensive emergency complicated by upper GI bleed and acute kidney injury on chronic kidney disease.  By the time of his transferhe wasstill having hematemesis and hematochezia. Patient underwent repeat endoscopy, thatrequired intubationfor airway protection  (03/29/19).  EGD with nonbleeding esophageal ulcer. Nonobstructing nonbleeding gastric ulcer with pigmented material. Patient required 5 units packed red blood cells and 3 fresh frozen plasma.  Patient was liberated from mechanical ventilation July 22,but remained on noninvasive mechanical ventilation until July 25.  Transfer to [facility] July 26.  July 27 had AV fistula placed for HD.  He has received hemodialysis with ultrafiltration and his antihypertensive agents were modified.  1. Acute hypoxic respiratory failure due to volume overload related acute pulmonary edema (not present on admission)/ due to ESRD. Patient underwent hemodialysis with ultrafiltration for 2 consecutive days with improvement of volume status and resolution of hypoxemia. At discharge his oxygenation is 94% on room air.   Anemia of chronic renal disease. Patient received  IV iron and darbapoetin on hemodialysis with good toleration, follow cell count as outpatient.   2. Upper GI bleed, positive esophageal and gastric ulcers/ acute blood loss anemia (upper gi bleed), sp 5 units PRBC and 3 FFP/.Patient required urgent endoscopic procedure, clips were placed to the non bleeding esophageal ulcer. He tolerated PRBC transfusion well. His diet was advanced with good toleration with no further bleeding. He will continue bid pantoprazole. His discharge Hgb is 9.0 and Hct 27.   3. Hypertensive emergency (present on admission).Patient underwent ultrafiltration along with aggressive medical therapy withhydralazine 100 tid,isosorbide30 tid, carvedilol25 bidand amlodipine10 mg.His discharge blood pressure is 132/84 mmHg.   3. CKD stage V, now on HD/ anuric/ suspected hypertensive nephropathy. With hyponatremia. (right IJ tunneled cather/ left upper extremity AV fistula). Patient was continued on hemodialysis with good toleration. He has received daily hemodialysis for the last 48 H, with significant improvement of his  symptoms. Vascular surgery was consulted and his right IJ catheter was exchanged for a tunneled catheter and had a AV fistula placed on his left upper extremity.  4. Anxiety. Patient episodic anxiety that improved with anxiolytic therapy.   4. Elevated troponin.Patient had upper abdominal pain, radiated to his chest. One troponin test high sensitivity was elevated. Further work up with echocardiography showed preserved LV function with no wall motion abnormalities. Isolated troponin elevation in the setting of esrd and uncontrolled hTN, with normal echocardiogram, was considered to rule out acute coronary syndrome. Patient had no further chest or abdominal pain.  5. Colitis.Clinically resolved/ Biopsy performed at [facility],  result pending. Need follow up.   Discharge Diagnoses:  Active Problems:   Hypertensive emergency   AKI (acute kidney injury) ([place])   Melena   Hematemesis   Acute gastric ulcer with hemorrhage   Ulcer of esophagus with bleeding    Discharge Instructions  Discharge Instructions    Diet - low sodium heart healthy   Complete by: As directed    Discharge instructions   Complete by: As directed    Please follow with hemodialysis in am.   Increase activity slowly   Complete by: As directed      Allergies as of 04/06/2019   No Known Allergies     Medication List    TAKE these medications   amLODipine 10 MG tablet Commonly known as: NORVASC Take 1 tablet (10 mg total) by mouth daily.   carvedilol 25 MG tablet Commonly known as: COREG Take 1 tablet (25 mg total) by mouth 2 (two) times daily with a meal. Start taking on: April 07, 2019 What changed:   medication strength  how much to take  when to take this   hydrALAZINE 100 MG tablet Commonly known as: APRESOLINE Take 1 tablet (100 mg total) by mouth every 8 (eight) hours.   isosorbide dinitrate 30 MG tablet Commonly known as: ISORDIL Take 1 tablet (30 mg total) by mouth 3 (three)  times daily.   pantoprazole 40 MG tablet Commonly known as: PROTONIX Take 1 tablet (40 mg total) by mouth 2 (two) times daily before a meal. Start taking on: April 07, 2019      Follow-up Information    Early, Arvilla Meres, MD In 4 weeks.   Specialties: Vascular Surgery, Cardiology Why: Office will call you to arrange your appt (sent) Contact information: [place] [address] [phone]          No Known Allergies  Consultations:  Nephrology  Gastroenterology  Vascular surgery   Procedures/Studies: Dg Chest 1 View  Result Date: 03/31/2019 CLINICAL DATA:  Acute respiratory failure with hypoxia EXAM: CHEST  1 VIEW COMPARISON:  03/29/2019 FINDINGS: Interval extubation. Support devices otherwise are stable. Very low lung volumes with increasing bilateral airspace disease which could reflect edema or infection. Suspect bilateral effusions. IMPRESSION: Interval extubation. Very low lung volumes with increasing diffuse bilateral airspace disease, edema versus infection. Suspect bilateral effusions. Electronically Signed   By: Rolm Baptise M.D.   On: 03/31/2019 08:23   Dg Chest Port 1 View  Result Date: 04/06/2019 CLINICAL DATA:  Hypoxia EXAM: PORTABLE CHEST 1 VIEW COMPARISON:  04/04/2019, 03/29/2019, 11/20/2017 FINDINGS: Right-sided venous catheter tip over the right atrium. Cardiomegaly. Vascular congestion. Extensive bilateral interstitial and alveolar airspace disease with worsening since prior radiograph. Dense consolidation at the left base. Probable small pleural effusions. IMPRESSION: 1. Cardiomegaly with vascular congestion and extensive interstitial and alveolar opacity which may be secondary to pulmonary edema or possibly diffuse infection. Overall the findings appear worse compared to the prior radiograph 2. Dense left lung base atelectasis or pneumonia. Probable  small pleural effusions. Electronically Signed   By: Donavan Foil M.D.   On: 04/06/2019 01:59   Dg  Chest Port 1 View  Result Date: 03/29/2019 CLINICAL DATA:  Status post intubation EXAM: PORTABLE CHEST 1 VIEW COMPARISON:  03/29/2019 FINDINGS: Endotracheal tube is now seen 17 mm above the carina. Left and right jugular central lines are noted just below the cavoatrial junction. No pneumothorax is seen. Right-sided pleural effusion is noted and stable. Cardiomegaly is again seen. IMPRESSION: Endotracheal tube in place just above the carina. Bilateral jugular central lines are noted. Stable right pleural effusion. Electronically Signed   By: Inez Catalina M.D.   On: 03/29/2019 15:51   Dg Chest Port 1 View  Result Date: 03/29/2019 CLINICAL DATA:  Post LEFT CVL placement. EXAM: PORTABLE CHEST 1 VIEW COMPARISON:  Chest x-ray dated 07/28/2017. FINDINGS: LEFT IJ central line appears well position with tip overlying the RIGHT atrium. RIGHT-sided central line also in place with tip overlying the RIGHT atrium. No pneumothorax seen. Probable atelectasis and/or small pleural effusion at the RIGHT lung base. IMPRESSION: 1. LEFT IJ central line appears well positioned with tip overlying the RIGHT atrium. No pneumothorax seen. 2. Probable atelectasis and/or small pleural effusion at the RIGHT lung base. Electronically Signed   By: Franki Cabot M.D.   On: 03/29/2019 14:38   Dg Chest Port 1v Same Day  Result Date: 04/04/2019 CLINICAL DATA:  Postop, status post dialysis catheter. EXAM: PORTABLE CHEST 1 VIEW COMPARISON:  Chest x-rays dated 03/31/2019 and 03/29/2019. FINDINGS: RIGHT-sided dialysis catheter in place with tip appropriately positioned over the RIGHT atrium. Heart size and mediastinal contours appear stable. Bilateral perihilar opacities, compatible with pulmonary edema. No pneumothorax seen. Enteric tube has been removed. LEFT IJ central line has been removed. IMPRESSION: 1. Dialysis catheter in place with tip appropriately positioned over the RIGHT atrium. No pneumothorax seen. 2. Bilateral perihilar  opacities, compatible with pulmonary edema. Electronically Signed   By: Franki Cabot M.D.   On: 04/04/2019 16:35   Dg Abd Portable 1v  Result Date: 03/30/2019 CLINICAL DATA:  NG tube placement. EXAM: PORTABLE ABDOMEN - 1 VIEW COMPARISON:  None. FINDINGS: NG tube tip is in the region of the mid stomach. Surgical clips in the left upper quadrant. Consolidation in the left lower lobe. Small bilateral pleural effusions. Two central lines in place, 1 at the level of the carina and the other at the level of the cavoatrial junction. Minimal air in nondistended bowel. IMPRESSION: NG tube tip in the mid stomach. Progressive consolidation at the left lung base.  Effusions. Electronically Signed   By: Lorriane Shire M.D.   On: 03/30/2019 20:05   Vas [place] Lower Extremity Venous (dvt)  Result Date: 04/02/2019  Lower Venous Study Indications: Pain.  Performing Technologist: Maudry Mayhew MHA, RDMS, RVT, RDCS  Examination Guidelines: A complete evaluation includes B-mode imaging, spectral Doppler, color Doppler, and power Doppler as needed of all accessible portions of each vessel. Bilateral testing is considered an integral part of a complete examination. Limited examinations for reoccurring indications may be performed as noted.  +---------+---------------+---------+-----------+----------+-------+ RIGHT    CompressibilityPhasicitySpontaneityPropertiesSummary +---------+---------------+---------+-----------+----------+-------+ CFV      Full           Yes      Yes                          +---------+---------------+---------+-----------+----------+-------+ SFJ      Full                                                 +---------+---------------+---------+-----------+----------+-------+  FV Prox  Full                                                 +---------+---------------+---------+-----------+----------+-------+ FV Mid   Full                                                  +---------+---------------+---------+-----------+----------+-------+ FV DistalFull                                                 +---------+---------------+---------+-----------+----------+-------+ PFV      Full                                                 +---------+---------------+---------+-----------+----------+-------+ POP      Full           Yes      Yes                          +---------+---------------+---------+-----------+----------+-------+ PTV      Full                                                 +---------+---------------+---------+-----------+----------+-------+ PERO     Full                                                 +---------+---------------+---------+-----------+----------+-------+   +---------+---------------+---------+-----------+----------+-------+ LEFT     CompressibilityPhasicitySpontaneityPropertiesSummary +---------+---------------+---------+-----------+----------+-------+ CFV      Full           Yes      Yes                          +---------+---------------+---------+-----------+----------+-------+ SFJ      Full                                                 +---------+---------------+---------+-----------+----------+-------+ FV Prox  Full                                                 +---------+---------------+---------+-----------+----------+-------+ FV Mid   Full                                                 +---------+---------------+---------+-----------+----------+-------+ FV  DistalFull                                                 +---------+---------------+---------+-----------+----------+-------+ PFV      Full                                                 +---------+---------------+---------+-----------+----------+-------+ POP      Full           Yes      Yes                          +---------+---------------+---------+-----------+----------+-------+ PTV      Full                                                  +---------+---------------+---------+-----------+----------+-------+ PERO     Full                                                 +---------+---------------+---------+-----------+----------+-------+     Summary: Right: There is no evidence of deep vein thrombosis in the lower extremity. No cystic structure found in the popliteal fossa. Left: There is no evidence of deep vein thrombosis in the lower extremity. No cystic structure found in the popliteal fossa.  *See table(s) above for measurements and observations. Electronically signed by Deitra Mayo MD on 04/02/2019 at 4:15:05 PM.    Final    Vas [place] Upper Ext Vein Mapping (pre-op Avf)  Result Date: 04/03/2019 UPPER EXTREMITY VEIN MAPPING  Indications: Pre-dialysis access. Comparison Study: No prior study on file for comparison Performing Technologist: Sharion Dove RVS  Examination Guidelines: A complete evaluation includes B-mode imaging, spectral Doppler, color Doppler, and power Doppler as needed of all accessible portions of each vessel. Bilateral testing is considered an integral part of a complete examination. Limited examinations for reoccurring indications may be performed as noted. +-----------------+-------------+----------+---------+ Right Cephalic   Diameter (cm)Depth (cm)Findings  +-----------------+-------------+----------+---------+ Shoulder             0.42        0.52             +-----------------+-------------+----------+---------+ Prox upper arm       0.43        0.30             +-----------------+-------------+----------+---------+ Mid upper arm        0.46        0.35             +-----------------+-------------+----------+---------+ Dist upper arm       0.23        0.28             +-----------------+-------------+----------+---------+ Antecubital fossa    0.31        0.57             +-----------------+-------------+----------+---------+  Prox forearm         0.28  0.57             +-----------------+-------------+----------+---------+ Mid forearm          0.27        0.47   branching +-----------------+-------------+----------+---------+ Wrist                0.24        0.37             +-----------------+-------------+----------+---------+ +-----------------+-------------+----------+---------+ Left Cephalic    Diameter (cm)Depth (cm)Findings  +-----------------+-------------+----------+---------+ Shoulder             0.42        0.50             +-----------------+-------------+----------+---------+ Prox upper arm       0.43        0.27             +-----------------+-------------+----------+---------+ Mid upper arm        0.35        0.42             +-----------------+-------------+----------+---------+ Dist upper arm       0.34        0.38             +-----------------+-------------+----------+---------+ Antecubital fossa    0.52        0.28   thrombus  +-----------------+-------------+----------+---------+ Prox forearm         0.27        0.19   branching +-----------------+-------------+----------+---------+ Wrist                0.28        0.27   branching +-----------------+-------------+----------+---------+ *See table(s) above for measurements and observations.  Diagnosing physician: Deitra Mayo MD Electronically signed by Deitra Mayo MD on 04/03/2019 at 7:10:30 PM.    Final    Hybrid Or Imaging ([facility] Only)  Result Date: 04/04/2019 There is no interpretation for this exam.  This order is for images obtained during a surgical procedure.  Please See "Surgeries" Tab for more information regarding the procedure.      Procedures: Upper endoscopy, right IJ tunneled hemodialysis catheter, left upper extremity AV fistula.  Subjective: Now patient after hemodialysis and ultrafiltration, feeling better, his dyspnea has improved, no chest pain.  No nausea  vomiting.  Discharge Exam: Vitals:   04/06/19 1500 04/06/19 1518  BP: (!) 168/104 (!) 173/108  Pulse: (!) 106 (!) 109  Resp:  (!) 22  Temp:  97.7 F (36.5 C)  SpO2:  96%   Vitals:   04/06/19 1400 04/06/19 1430 04/06/19 1500 04/06/19 1518  BP: (!) 172/109 (!) 165/105 (!) 168/104 (!) 173/108  Pulse: (!) 106 (!) 104 (!) 106 (!) 109  Resp:    (!) 22  Temp:    97.7 F (36.5 C)  TempSrc:    Oral  SpO2:    96%  Weight:    85.3 kg  Height:        General: Not in pain or dyspnea Neurology: Awake and alert, non focal  E ENT: no pallor, no icterus, oral mucosa moist Cardiovascular: No JVD. S1-S2 present, rhythmic, no gallops, rubs, or murmurs. No lower extremity edema. Pulmonary: positivebreath sounds bilaterally, adequate air movement, no wheezing, rhonchi or rales. Gastrointestinal. Abdomen with no organomegaly, non tender, no rebound or guarding Skin. No rashes Musculoskeletal: no joint deformities Right IJ HD catheter Left upper extremity fistula.   The results of significant diagnostics from this hospitalization (including imaging, microbiology, ancillary  and laboratory) are listed below for reference.     Microbiology: Recent Results (from the past 240 hour(s))  MRSA PCR Screening     Status: None   Collection Time: 03/29/19 10:16 AM   Specimen: Nasal Mucosa; Nasopharyngeal  Result Value Ref Range Status   MRSA by PCR NEGATIVE NEGATIVE Final    Comment:        The GeneXpert MRSA Assay (FDA approved for NASAL specimens only), is one component of a comprehensive MRSA colonization surveillance program. It is not intended to diagnose MRSA infection nor to guide or monitor treatment for MRSA infections. Performed at [facility], [place] [address] [place], [place] [address]      Labs: BNP (last 3 results) No results for input(s): BNP in the last 8760 hours. Basic Metabolic Panel: Recent Labs  Lab 03/31/19 1304 04/01/19 0529 04/02/19 0523 04/03/19 0506  04/04/19 0451 04/05/19 0547  NA  --  133* 134* 132* 133* 128*  K  --  3.8 3.8 3.3* 3.3* 4.2  CL  --  99 98 97* 96* 94*  CO2  --  19* 24 26 25  19*  GLUCOSE  --  91 102* 122* 101* 143*  BUN  --  64* 32* 21* 30* 44*  CREATININE  --  13.16* 8.28* 6.98* 9.41* 11.60*  CALCIUM 7.5* 7.7* 7.7* 7.7* 7.8* 7.9*  PHOS 10.6*  --   --  3.6  --   --    Liver Function Tests: No results for input(s): AST, ALT, ALKPHOS, BILITOT, PROT, ALBUMIN in the last 168 hours. No results for input(s): LIPASE, AMYLASE in the last 168 hours. No results for input(s): AMMONIA in the last 168 hours. CBC: Recent Labs  Lab 03/31/19 0350 04/01/19 0529 04/02/19 0523 04/03/19 0506 04/03/19 1232 04/05/19 0547  WBC 16.4* 14.9* 16.2* 13.6*  --  10.4  NEUTROABS  --   --   --   --   --  9.1*  HGB 8.3* 7.4* 8.0* 8.3* 8.9* 9.0*  HCT 24.4* 22.0* 23.7* 25.2* 26.7* 27.1*  MCV 89.7 90.5 90.8 92.3  --  90.0  PLT 131* 153 195 230  --  302   Cardiac Enzymes: No results for input(s): CKTOTAL, CKMB, CKMBINDEX, TROPONINI in the last 168 hours. BNP: Invalid input(s): POCBNP CBG: Recent Labs  Lab 03/31/19 2033 03/31/19 2333 04/01/19 0337 04/01/19 0725 04/03/19 0759  GLUCAP 92 86 89 81 103*   D-Dimer No results for input(s): DDIMER in the last 72 hours. Hgb A1c No results for input(s): HGBA1C in the last 72 hours. Lipid Profile No results for input(s): CHOL, HDL, LDLCALC, TRIG, CHOLHDL, LDLDIRECT in the last 72 hours. Thyroid function studies No results for input(s): TSH, T4TOTAL, T3FREE, THYROIDAB in the last 72 hours.  Invalid input(s): FREET3 Anemia work up No results for input(s): VITAMINB12, FOLATE, FERRITIN, TIBC, IRON, RETICCTPCT in the last 72 hours. Urinalysis No results found for: COLORURINE, APPEARANCEUR, [place], [place], [place], [place], [place], [place], PROTEINUR, UROBILINOGEN, NITRITE, LEUKOCYTESUR Sepsis Labs Invalid input(s): PROCALCITONIN,  WBC,  LACTICIDVEN Microbiology Recent Results  (from the past 240 hour(s))  MRSA PCR Screening     Status: None   Collection Time: 03/29/19 10:16 AM   Specimen: Nasal Mucosa; Nasopharyngeal  Result Value Ref Range Status   MRSA by PCR NEGATIVE NEGATIVE Final    Comment:        The GeneXpert MRSA Assay (FDA approved for NASAL specimens only), is one component of a comprehensive MRSA colonization surveillance program. It  is not intended to diagnose MRSA infection nor to guide or monitor treatment for MRSA infections. Performed at [facility], [place] [address], [place], [place] [address]      Time coordinating discharge: 45 minutes  SIGNED:   Tawni Millers, MD  [facility] 04/06/2019, 5:01 PM

## 2019-04-06 NOTE — Progress Notes (Signed)
Pt states uncontrollable anxiety; needed to increase o2 from 7L to 9L. RR 28. Labetalol given for elevated BP. On call provider notified.

## 2019-04-06 NOTE — Progress Notes (Signed)
Pt states that he sometimes "just gets nervous" and "can't help it". Says he feels a lot calmer and improved on non breather.

## 2019-04-06 NOTE — Progress Notes (Signed)
Per [facility], they are still awaiting financial clearance, medical acceptance and a seat schedule for patient. Renal Navigator informed Admissions Coordinator that patient is ready for discharge. Renal Navigator will continue to follow closely.  Alphonzo Cruise, [place] Renal Navigator [phone]

## 2019-04-06 NOTE — Progress Notes (Signed)
Pt refusing repeat ABG attempt at this time citing too much pain; on call provider notified.

## 2019-04-06 NOTE — Progress Notes (Signed)
Rapid Response RN notified @0204  of change in pt status; pt began to desat with RT and RN during failed ABG attempt. Pt pale, RR increased to 30's. Was titrated up to 12L o2 HFNC, then transitioned to non rebreather after notification to RR and on call provider Baltazar Najjar NP.

## 2019-04-06 NOTE — Progress Notes (Signed)
PROGRESS NOTE    Tanyon Alipio  QQV:[id number] DOB: 01-May-1991 DOA: 03/29/2019 PCP: Default, Provider, MD    Brief Narrative:  28 year old male who presented with uncontrolled hypertension. He does have chronic kidney disease stage V, hypertension and history of prior CVA. Patient reported 5 days of nausea, vomiting and diffuse abdominal pain, in the emergency department [facility]) hisblood pressure was more than 200/100,and had a witnessed generalized seizure. Was diagnosed with hypertensive emergency and placed on nicardipine infusion. While hospitalized he developed hematemesis and hematochezia. EGD showed diffuse gastritis and single gastric ulcer actively bleeding. Status post 3 clips. Sigmoidoscopy with diffuse friable edematous tissue. He had a worsening renal function and he was started on hemodialysis per right internaljugular vein hemodialysis catheter. [facility].  Patient was admitted to the intensive care unit working diagnosis of hypertensive emergency complicated by upper GI bleed and acute kidney injury on chronic kidney disease.  By the time of his transferhe wasstill having hematemesis and hematochezia. Patient underwent repeat endoscopy, thatrequired intubationfor airway protection (03/29/19).  EGD with nonbleeding esophageal ulcer, please replace. Nonobstructing nonbleeding gastric ulcer with pigmented material. Patient required 5 units packed red blood cells and 3 fresh frozen plasma.  Patient was liberated from mechanical ventilation July 22,remain on noninvasive mechanical ventilation until July 25.  Transfer to [facility] July 26.  July 27 had AV fistula placed for HD   Assessment & Plan:   Active Problems:   Hypertensive emergency   AKI (acute kidney injury) ([place])   Melena   Hematemesis   Acute gastric ulcer with hemorrhage   Ulcer of esophagus with bleeding   1. Acute hypoxic respiratory  failure due to volume overload related pulmonary edema/ due to ESRD. Patient with worsening hypoxemia despite ultrafiltration 2,971 ml yesterday. His chest film personally reviewed with worsening pulmonary edema, increased oxygen requirements, to high flow nasal cannula 9 LPM oxygen saturation 92%. His blood pressure still elevated 157/115 mmHg.   Plan for HD with UF today for hypervolemia, per nephrology. Continue oxymetry monitoring and blood pressure control.   Anemia of chronic renal disease, continue IV iron and darbepoetin.   2. Upper GI bleed, positive esophageal and gastric ulcers/ acute blood loss anemia (upper gi bleed), sp 5 units PRBC and 3 FFP/.Continue bid pantoprazole, no recurrent bleeding, tolerating well po diet.   2. Hypertensive emergency (present on admission).persistent hypervolemia, for ultrafiltration today, continue with Hydralazine 100 tid,isosorbide 30 tid, carvedilol 25 bid and amlodipine 10 mg. Once patient more euvolemic will continue titration of antihypertensive agents.   3. CKD stage V, now on HD/ anuric/ suspected hypertensive nephropathy.New Hyponatremia. (right IJ tunneled cather/ left upper extremity AV fistula). Plan for HD with UF today, for 2nd consecutive day, patient with hypervolemia.   4. Anxiety. This am with severe anxiety and tremors, will continue symptomatic control with as needed lorazepam po. Continue neuro checks per unit protocol.   4. Elevated troponin.ruled out acs.   5. Colitis.Clinically resolved/ Biopsy result pending. [facility]).     DVT prophylaxis:scd Code Status:full Family Communication:no family at the bedside Disposition Plan/ discharge barriers:Patient continue critically ill.   Body mass index is 25.96 kg/m. Malnutrition Type:      Malnutrition Characteristics:      Nutrition Interventions:     RN Pressure Injury Documentation:     Consultants:   Nephrology   Vascular  surgery   Procedures:   Right IJ tunneled HD catheter  Left upper extremity AV fistula.   Antimicrobials:  Subjective: Patient with significant dyspnea and anxiety, last night received IV lorazepam 0,5 mg, positive tremors. Increased 02 requirements.   Objective: Vitals:   04/06/19 0111 04/06/19 0230 04/06/19 0557 04/06/19 0700  BP: (!) 157/115     Pulse:      Resp: (!) 28     Temp:      TempSrc:      SpO2: 92% 100%  92%  Weight:   91.7 kg   Height:        Intake/Output Summary (Last 24 hours) at 04/06/2019 0833 Last data filed at 04/06/2019 0704 Gross per 24 hour  Intake 717.67 ml  Output 2971 ml  Net -2253.33 ml   Filed Weights   04/05/19 1255 04/05/19 1648 04/06/19 0557  Weight: 93.2 kg 88.9 kg 91.7 kg    Examination:   General: deconditioned and ill looking appearing  Neurology: Awake and alert, non focal. Positive anxiety and faint hands tremors.  E ENT: no pallor, no icterus, oral mucosa moist Cardiovascular: No JVD. S1-S2 present, rhythmic, no gallops, rubs, or murmurs. No lower extremity edema. Pulmonary: positive breath sounds bilaterally, no wheezing, or rhonchi positive bilateral rales, predominantly at bases . Gastrointestinal. Abdomen with no organomegaly, non tender, no rebound or guarding Skin. No rashes Musculoskeletal: no joint deformities     Data Reviewed: I have personally reviewed following labs and imaging studies  CBC: Recent Labs  Lab 03/31/19 0350 04/01/19 0529 04/02/19 0523 04/03/19 0506 04/03/19 1232 04/05/19 0547  WBC 16.4* 14.9* 16.2* 13.6*  --  10.4  NEUTROABS  --   --   --   --   --  9.1*  HGB 8.3* 7.4* 8.0* 8.3* 8.9* 9.0*  HCT 24.4* 22.0* 23.7* 25.2* 26.7* 27.1*  MCV 89.7 90.5 90.8 92.3  --  90.0  PLT 131* 153 195 230  --  622   Basic Metabolic Panel: Recent Labs  Lab 03/31/19 1304 04/01/19 0529 04/02/19 0523 04/03/19 0506 04/04/19 0451 04/05/19 0547  NA  --  133* 134* 132* 133* 128*  K  --  3.8 3.8  3.3* 3.3* 4.2  CL  --  99 98 97* 96* 94*  CO2  --  19* 24 26 25  19*  GLUCOSE  --  91 102* 122* 101* 143*  BUN  --  64* 32* 21* 30* 44*  CREATININE  --  13.16* 8.28* 6.98* 9.41* 11.60*  CALCIUM 7.5* 7.7* 7.7* 7.7* 7.8* 7.9*  PHOS 10.6*  --   --  3.6  --   --    GFR: Estimated Creatinine Clearance: 11.1 mL/min (A) (by C-G formula based on SCr of 11.6 mg/dL (H)). Liver Function Tests: No results for input(s): AST, ALT, ALKPHOS, BILITOT, PROT, ALBUMIN in the last 168 hours. No results for input(s): LIPASE, AMYLASE in the last 168 hours. No results for input(s): AMMONIA in the last 168 hours. Coagulation Profile: No results for input(s): INR, PROTIME in the last 168 hours. Cardiac Enzymes: No results for input(s): CKTOTAL, CKMB, CKMBINDEX, TROPONINI in the last 168 hours. BNP (last 3 results) No results for input(s): PROBNP in the last 8760 hours. HbA1C: Recent Labs    04/03/19 1232  HGBA1C 5.1   CBG: Recent Labs  Lab 03/31/19 2033 03/31/19 2333 04/01/19 0337 04/01/19 0725 04/03/19 0759  GLUCAP 92 86 89 81 103*   Lipid Profile: No results for input(s): CHOL, HDL, LDLCALC, TRIG, CHOLHDL, LDLDIRECT in the last 72 hours. Thyroid Function Tests: No results for input(s): TSH, T4TOTAL, FREET4, T3FREE, THYROIDAB  in the last 72 hours. Anemia Panel: No results for input(s): VITAMINB12, FOLATE, FERRITIN, TIBC, IRON, RETICCTPCT in the last 72 hours.    Radiology Studies: I have reviewed all of the imaging during this hospital visit personally     Scheduled Meds: . amLODipine  10 mg Oral Daily  . carvedilol  25 mg Oral BID WC  . chlorhexidine  15 mL Mouth Rinse BID  . Chlorhexidine Gluconate Cloth  6 each Topical Daily  . darbepoetin (ARANESP) injection - DIALYSIS  100 mcg Intravenous Q Thu-HD  . hydrALAZINE  100 mg Oral Q8H  . isosorbide dinitrate  30 mg Oral TID  . mouth rinse  15 mL Mouth Rinse q12n4p  . pantoprazole  40 mg Oral BID AC  . sodium chloride flush  10-40  mL Intracatheter Q12H   Continuous Infusions: . sodium chloride    . sodium chloride    . sodium chloride 10 mL/hr at 04/04/19 1238  . ferric gluconate (FERRLECIT/NULECIT) IV Stopped (04/05/19 1721)     LOS: 8 days        Avir Deruiter Gerome Apley, MD

## 2019-04-06 NOTE — Significant Event (Signed)
Rapid Response Event Note  Overview: Time Called: 0204 Arrival Time: 0206 Event Type: Respiratory   PT with SOB, and tachypnea  Initial Focused Assessment: On arrival, pt with labored breathing, spO2 89% on 12L HFNC, RR 35, HR 106, BP 157/115. Lung sounds clear/diminished. No c/o pain, pt does appear anxious throughout encounter.   Interventions: CXR (prior to my arrival)- worsening congestion/edema Abg- pt refused Placed on NRB- spO2 improved to 100% 60mg  Lasix IV   Plan of Care (if not transferred): Pt states he is more comfortable on NRB with spO2 improvement, anxiety seems to be playing a role in his resp status as well. Will try to wean off NRB as tolerated. Continue to monitor respiratory status. He may need PRN ativan order to keep calm- Baltazar Najjar aware and states to call if needed. Will see if Lasix removes fluid and helps breathing. RN instructed to call with any changes or concerns.  Event Summary: Name of Physician Notified: TRH- Kirby at (prior to my arrival)  Outcome: Stayed in room and stabalized     Sherilyn Dacosta

## 2019-04-06 NOTE — Procedures (Signed)
Patient seen on Hemodialysis. BP (!) 169/118   Pulse (!) 103   Temp 97.9 F (36.6 C) (Oral)   Resp (!) 24   Ht 6\' 2"  (1.88 m)   Wt 88.3 kg   SpO2 96%   BMI 24.99 kg/m   QB 400, UF goal 3L Tolerating treatment without complaints at this time.   Elmarie Shiley MD [facility]. Office # [phone] Pager # [phone] 2:32 PM

## 2019-04-28 ENCOUNTER — Inpatient Hospital Stay: Payer: Self-pay | Admitting: Internal Medicine

## 2019-05-11 ENCOUNTER — Ambulatory Visit (INDEPENDENT_AMBULATORY_CARE_PROVIDER_SITE_OTHER): Payer: Self-pay

## 2019-05-11 ENCOUNTER — Other Ambulatory Visit: Payer: Self-pay

## 2019-05-11 ENCOUNTER — Encounter: Payer: Self-pay | Admitting: Pulmonary Disease

## 2019-05-11 ENCOUNTER — Ambulatory Visit (INDEPENDENT_AMBULATORY_CARE_PROVIDER_SITE_OTHER): Payer: Self-pay | Admitting: Pulmonary Disease

## 2019-05-11 VITALS — BP 122/80 | HR 87 | Temp 100.4°F | Ht 70.0 in | Wt 194.0 lb

## 2019-05-11 DIAGNOSIS — R0683 Snoring: Secondary | ICD-10-CM

## 2019-05-11 DIAGNOSIS — Z992 Dependence on renal dialysis: Secondary | ICD-10-CM

## 2019-05-11 DIAGNOSIS — N186 End stage renal disease: Secondary | ICD-10-CM

## 2019-05-11 DIAGNOSIS — Z8709 Personal history of other diseases of the respiratory system: Secondary | ICD-10-CM

## 2019-05-11 NOTE — Patient Instructions (Addendum)
Moderate probability of significant sleep disordered breathing  Order home sleep study  We will call you after we get results back  I will see you back in the office in about 6 to 8 weeks  Obtain chest x-ray as follow-up on recent chest x-ray performed in the hospital

## 2019-05-11 NOTE — Progress Notes (Signed)
[name]    RL:[id number]    [birth year]  Primary Care Physician:Default, Provider, MD  Referring Physician: No referring provider defined for this encounter.  Chief complaint:   Shortness of breath with laying down History of snoring  HPI:  Has been told by his girlfriend that he snores No witnessed apneas Wakes up gasping  He was recently hospitalized Just started dialysis about 3 weeks ago  Wakes up a few times during the night usual bedtime is about 1130, takes him a long time to fall asleep wakes up up to 5 times during the night Final awakening time about 10:30 in the morning  No family history of obstructive sleep apnea  Hypertension-uncontrolled led to his renal failure according to patient  He is not coughing, not bringing up any secretions No chest pains or chest discomfort  Outpatient Encounter Medications as of 05/11/2019  Medication Sig  . amLODipine (NORVASC) 10 MG tablet Take 1 tablet (10 mg total) by mouth daily.  . carvedilol (COREG) 25 MG tablet Take 1 tablet (25 mg total) by mouth 2 (two) times daily with a meal.  . hydrALAZINE (APRESOLINE) 100 MG tablet Take 1 tablet (100 mg total) by mouth every 8 (eight) hours.  . isosorbide dinitrate (ISORDIL) 30 MG tablet Take 1 tablet (30 mg total) by mouth 3 (three) times daily.  . pantoprazole (PROTONIX) 40 MG tablet Take 1 tablet (40 mg total) by mouth 2 (two) times daily before a meal.   No facility-administered encounter medications on file as of 05/11/2019.     Allergies as of 05/11/2019  . (No Known Allergies)    Past Medical History:  Diagnosis Date  . Hypertension 03/29/2019    Past Surgical History:  Procedure Laterality Date  . AV FISTULA PLACEMENT Left 04/04/2019   Procedure: Creation of First Stage Basilic Vein Arteriovenous Fistula;  Surgeon: Rosetta Posner, MD;  Location: [facility] OR;  Service: Vascular;  Laterality: Left;  . ESOPHAGOGASTRODUODENOSCOPY (EGD) WITH PROPOFOL N/A 03/29/2019    Procedure: ESOPHAGOGASTRODUODENOSCOPY (EGD) WITH PROPOFOL;  Surgeon: Mauri Pole, MD;  Location: [facility];  Service: Endoscopy;  Laterality: N/A;  . HEMOSTASIS CLIP PLACEMENT  03/29/2019   Procedure: HEMOSTASIS CLIP PLACEMENT;  Surgeon: Mauri Pole, MD;  Location: [facility] ENDOSCOPY;  Service: Endoscopy;;  . INSERTION OF DIALYSIS CATHETER Right 04/04/2019   Procedure: INSERTION OF DIALYSIS CATHETER;  Surgeon: Rosetta Posner, MD;  Location: [facility] OR;  Service: Vascular;  Laterality: Right;    Family History  Problem Relation Age of Onset  . Hypertension Mother   . Heart attack Mother   . Diabetes Mother     Social History   Socioeconomic History  . Marital status: Single    Spouse name: Not on file  . Number of children: Not on file  . Years of education: Not on file  . Highest education level: Not on file  Occupational History  . Not on file  Social Needs  . Financial resource strain: Not on file  . Food insecurity    Worry: Not on file    Inability: Not on file  . Transportation needs    Medical: Not on file    Non-medical: Not on file  Tobacco Use  . Smoking status: Former Smoker    Packs/day: 0.25    Types: Cigars    Quit date: 03/2019    Years since quitting: 0.1  . Smokeless tobacco: Never Used  Substance and Sexual Activity  .  Alcohol use: Yes    Alcohol/week: 4.0 standard drinks    Types: 4 Shots of liquor per week  . Drug use: Never  . Sexual activity: Yes  Lifestyle  . Physical activity    Days per week: Not on file    Minutes per session: Not on file  . Stress: Not on file  Relationships  . Social Herbalist on phone: Not on file    Gets together: Not on file    Attends religious service: Not on file    Active member of club or organization: Not on file    Attends meetings of clubs or organizations: Not on file    Relationship status: Not on file  . Intimate partner violence    Fear of current or ex partner: Not on file     Emotionally abused: Not on file    Physically abused: Not on file    Forced sexual activity: Not on file  Other Topics Concern  . Not on file  Social History Narrative  . Not on file    Review of Systems  Constitutional: Negative.   HENT: Negative.   Eyes: Negative.   Respiratory: Positive for shortness of breath.   Psychiatric/Behavioral: Positive for sleep disturbance.  All other systems reviewed and are negative.   Vitals:   05/11/19 1515 05/11/19 1516  BP:  122/80  Pulse:  87  Temp: (!) 100.4 F (38 C)   SpO2:  98%     Physical Exam  Constitutional: He appears well-developed and well-nourished.  HENT:  Head: Normocephalic and atraumatic.  Eyes: Pupils are equal, round, and reactive to light. Conjunctivae are normal. Right eye exhibits no discharge. Left eye exhibits no discharge.  Neck: Normal range of motion. Neck supple. No tracheal deviation present. No thyromegaly present.  Cardiovascular: Normal rate and regular rhythm.  Pulmonary/Chest: Effort normal and breath sounds normal. No respiratory distress. He has no wheezes. He has no rales.  Abdominal: Soft. Bowel sounds are normal. He exhibits no distension. There is no abdominal tenderness.   Epworth Sleepiness Scale of 7  Data Reviewed: Recent chest x-ray in the hospital 7/29 did reveal pleural effusions, atelectasis  Assessment:  Moderate probability of significant sleep disordered breathing  Nonrestorative sleep  Excessive daytime sleepiness  Plan/Recommendations: Pathophysiology of sleep disordered breathing discussed with the patient  Treatment options discussed with the patient  We will set the patient up for home sleep study  I will see him back in the office in about 2 to 3 months  We will get a chest x-ray as follow-up of recent chest x-ray showing pleural effusions and atelectasis   Sherrilyn Rist MD [facility] 05/11/2019, 3:29 PM  CC: No ref. provider found

## 2019-05-12 ENCOUNTER — Ambulatory Visit (HOSPITAL_BASED_OUTPATIENT_CLINIC_OR_DEPARTMENT_OTHER): Payer: Self-pay | Admitting: Licensed Clinical Social Worker

## 2019-05-12 ENCOUNTER — Encounter: Payer: Self-pay | Admitting: Internal Medicine

## 2019-05-12 ENCOUNTER — Ambulatory Visit: Payer: Self-pay | Attending: Internal Medicine | Admitting: Internal Medicine

## 2019-05-12 VITALS — BP 158/93 | HR 90 | Temp 98.6°F | Resp 16 | Ht 70.0 in | Wt 180.6 lb

## 2019-05-12 DIAGNOSIS — D649 Anemia, unspecified: Secondary | ICD-10-CM

## 2019-05-12 DIAGNOSIS — R0789 Other chest pain: Secondary | ICD-10-CM

## 2019-05-12 DIAGNOSIS — I10 Essential (primary) hypertension: Secondary | ICD-10-CM

## 2019-05-12 DIAGNOSIS — K922 Gastrointestinal hemorrhage, unspecified: Secondary | ICD-10-CM

## 2019-05-12 DIAGNOSIS — Z09 Encounter for follow-up examination after completed treatment for conditions other than malignant neoplasm: Secondary | ICD-10-CM

## 2019-05-12 DIAGNOSIS — N186 End stage renal disease: Secondary | ICD-10-CM

## 2019-05-12 DIAGNOSIS — F4323 Adjustment disorder with mixed anxiety and depressed mood: Secondary | ICD-10-CM

## 2019-05-12 DIAGNOSIS — Z2821 Immunization not carried out because of patient refusal: Secondary | ICD-10-CM

## 2019-05-12 DIAGNOSIS — F411 Generalized anxiety disorder: Secondary | ICD-10-CM

## 2019-05-12 DIAGNOSIS — Z1331 Encounter for screening for depression: Secondary | ICD-10-CM

## 2019-05-12 DIAGNOSIS — Z87891 Personal history of nicotine dependence: Secondary | ICD-10-CM

## 2019-05-12 MED ORDER — CARVEDILOL 25 MG PO TABS: 25.0000 mg | ORAL_TABLET | Freq: Two times a day (BID) | ORAL | 3 refills | Status: AC

## 2019-05-12 MED ORDER — FERROUS SULFATE 325 (65 FE) MG PO TABS: 325.0000 mg | ORAL_TABLET | Freq: Every day | ORAL | 0 refills | Status: AC

## 2019-05-12 MED ORDER — ISOSORBIDE DINITRATE 30 MG PO TABS: 30.0000 mg | ORAL_TABLET | Freq: Three times a day (TID) | ORAL | 3 refills | Status: AC

## 2019-05-12 MED ORDER — PANTOPRAZOLE SODIUM 40 MG PO TBEC: 40.0000 mg | DELAYED_RELEASE_TABLET | Freq: Two times a day (BID) | ORAL | 4 refills | Status: AC

## 2019-05-12 MED ORDER — SERTRALINE HCL 50 MG PO TABS: ORAL_TABLET | ORAL | 2 refills | Status: AC

## 2019-05-12 MED ORDER — HYDRALAZINE HCL 100 MG PO TABS: 100.0000 mg | ORAL_TABLET | Freq: Three times a day (TID) | ORAL | 3 refills | Status: AC

## 2019-05-12 MED ORDER — AMLODIPINE BESYLATE 10 MG PO TABS: 10.0000 mg | ORAL_TABLET | Freq: Every day | ORAL | 3 refills | Status: AC

## 2019-05-12 NOTE — Patient Instructions (Signed)
You should continue to take your medications as prescribed.  Please find out from the kidney specialist whether you need to take iron supplement by mouth.  I have sent the prescription for this to your pharmacy.  The name of it is ferrous sulfate.  You can take Tylenol 325 mg 2 tablets twice a day as needed for pain from the catheter that is in your chest. You should not take any ibuprofen, Aleve, Advil, aspirin given your history of recent bleeding from the stomach.  I have sent a prescription to your pharmacy called Zoloft for you to start taking to help decrease the anxiety.

## 2019-05-12 NOTE — Progress Notes (Signed)
Patient ID: [name], male    DOB: [birth year]  MRN: [id number]  CC: Hospitalization Follow-up   Subjective: [name] is a 28 y.o. male who presents for hospital follow-up and new patient visit. His concerns today include:  Patient with history of ESRD, malignant HTN, UGIB 2nd to gastric/esophageal ulcers  No previous PCP.  Had dx of HTN but was not on meds; unable to afford  Patient hospitalized 7/21-29/2020 with hypertensive emergency in setting of CKD 5 (new discovery in hosp), acute upper GI bleed secondary to esophageal and gastric ulcers, acute hypoxic respiratory failure due to volume overload related to pulmonary edema.  Transfused 5 units PRBCs and 3 units FFP.  H/H on dischg 9/27. Started on HD during hosp.  Has tunnel cath and AV fistula placed LUE while in hosp. he was noted to have an elevated troponin level without chest pain.  Echo showed preserved LV function with no wall motion abnormalities.  ESRD/maligant HTN:  Will be going Tue/Thur and Sat. + LE edema. Legs sore.  Reports compliance with meds but did no take as ye for the morning.  Takes after HD session to prevent blood pressure from dropping low during dialysis -limits salt in foods -Request something for pain.  He states that the catheter in the right anterior chest is very uncomfortable.  Gastric/Esophageal:  No further hematemesis.  No black stools.  Reports getting iron infusion at HD 1 wk ago.  -use to drink 5 mix ETOH drinks on wkends but not any more  Tob dep:  Quit smoking 1.5 mhs ago  Requesting completion of form for handicap sticker.  Feels out of breath when he walks.  He has positive depression and anxiety screen: Denies any suicidal ideation.  He states that he is more anxious than depressed.  He is having difficult time sleeping at nights because he is so anxious given everything that has happened to him.  Past medical history, social history, family history reviewed and updated.   Patient Active Problem List   Diagnosis Date Noted  . Melena   . Hematemesis   . Acute gastric ulcer with hemorrhage   . Ulcer of esophagus with bleeding   . Hypertensive emergency 03/29/2019  . AKI (acute kidney injury) ([place])      No current outpatient medications on file prior to visit.   No current facility-administered medications on file prior to visit.     No Known Allergies  Social History   Socioeconomic History  . Marital status: Single    Spouse name: Not on file  . Number of children: 0  . Years of education: high school diploma  . Highest education level: Not on file  Occupational History  . Not on file  Social Needs  . Financial resource strain: Not on file  . Food insecurity    Worry: Not on file    Inability: Not on file  . Transportation needs    Medical: Not on file    Non-medical: Not on file  Tobacco Use  . Smoking status: Former Smoker    Packs/day: 0.25    Types: Cigars    Quit date: 03/2019    Years since quitting: 0.1  . Smokeless tobacco: Never Used  Substance and Sexual Activity  . Alcohol use: Yes    Alcohol/week: 4.0 standard drinks    Types: 4 Shots of liquor per week  . Drug use: Never  . Sexual activity: Yes  Lifestyle  . Physical activity  Days per week: Not on file    Minutes per session: Not on file  . Stress: Not on file  Relationships  . Social Herbalist on phone: Not on file    Gets together: Not on file    Attends religious service: Not on file    Active member of club or organization: Not on file    Attends meetings of clubs or organizations: Not on file    Relationship status: Not on file  . Intimate partner violence    Fear of current or ex partner: Not on file    Emotionally abused: Not on file    Physically abused: Not on file    Forced sexual activity: Not on file  Other Topics Concern  . Not on file  Social History Narrative  . Not on file    Family History  Problem Relation Age of Onset   . Hypertension Mother   . Heart attack Mother   . Diabetes Mother   . Kidney failure Brother     Past Surgical History:  Procedure Laterality Date  . AV FISTULA PLACEMENT Left 04/04/2019   Procedure: Creation of First Stage Basilic Vein Arteriovenous Fistula;  Surgeon: Rosetta Posner, MD;  Location: [facility] OR;  Service: Vascular;  Laterality: Left;  . ESOPHAGOGASTRODUODENOSCOPY (EGD) WITH PROPOFOL N/A 03/29/2019   Procedure: ESOPHAGOGASTRODUODENOSCOPY (EGD) WITH PROPOFOL;  Surgeon: Mauri Pole, MD;  Location: [facility] ENDOSCOPY;  Service: Endoscopy;  Laterality: N/A;  . HEMOSTASIS CLIP PLACEMENT  03/29/2019   Procedure: HEMOSTASIS CLIP PLACEMENT;  Surgeon: Mauri Pole, MD;  Location: [facility] ENDOSCOPY;  Service: Endoscopy;;  . INSERTION OF DIALYSIS CATHETER Right 04/04/2019   Procedure: INSERTION OF DIALYSIS CATHETER;  Surgeon: Rosetta Posner, MD;  Location: [facility] OR;  Service: Vascular;  Laterality: Right;    ROS: Review of Systems Negative except as stated above  PHYSICAL EXAM: BP (!) 158/93   Pulse 90   Temp 98.6 F (37 C) (Oral)   Resp 16   Ht 5\' 10"  (1.778 m)   Wt 180 lb 9.6 oz (81.9 kg)   SpO2 98%   BMI 25.91 kg/m   Physical Exam General appearance - alert, well appearing, and in no distress Mental status - normal mood, behavior, speech, dress, motor activity, and thought processes Eyes - pupils equal and reactive, extraocular eye movements intact Mouth - mucous membranes moist, pharynx normal without lesions Neck - supple, no significant adenopathy Chest - clear to auscultation, no wheezes, rales or rhonchi, symmetric air entry Heart - normal rate, regular rhythm, normal S1, S2, no murmurs, rubs, clicks or gallops Abdomen - soft, nontender, nondistended, no masses or organomegaly Extremities - peripheral pulses normal, no pedal edema, no clubbing or cyanosis Skin: He has a tunneled catheter in the right upper anterior chest wall. Depression screen PHQ 2/9 05/12/2019   Decreased Interest 1  Down, Depressed, Hopeless 1  PHQ - 2 Score 2  Altered sleeping 1  Tired, decreased energy 2  Change in appetite 1  Feeling bad or failure about yourself  1  Trouble concentrating 1  Moving slowly or fidgety/restless 1  Suicidal thoughts 1  PHQ-9 Score 10   GAD 7 : Generalized Anxiety Score 05/12/2019  Nervous, Anxious, on Edge 1  Control/stop worrying 1  Worry too much - different things 1  Trouble relaxing 1  Restless 1  Easily annoyed or irritable 1  Afraid - awful might happen 1  Total GAD 7 Score 7  CMP Latest Ref Rng & Units 04/05/2019 04/04/2019 04/03/2019  Glucose 70 - 99 mg/dL 143(H) 101(H) 122(H)  BUN 6 - 20 mg/dL 44(H) 30(H) 21(H)  Creatinine 0.61 - 1.24 mg/dL 11.60(H) 9.41(H) 6.98(H)  Sodium 135 - 145 mmol/L 128(L) 133(L) 132(L)  Potassium 3.5 - 5.1 mmol/L 4.2 3.3(L) 3.3(L)  Chloride 98 - 111 mmol/L 94(L) 96(L) 97(L)  CO2 22 - 32 mmol/L 19(L) 25 26  Calcium 8.9 - 10.3 mg/dL 7.9(L) 7.8(L) 7.7(L)  Total Protein 6.5 - 8.1 g/dL - - -  Total Bilirubin 0.3 - 1.2 mg/dL - - -  Alkaline Phos 38 - 126 U/L - - -  AST 15 - 41 U/L - - -  ALT 0 - 44 U/L - - -   Lipid Panel     Component Value Date/Time   TRIG 360 (H) 03/29/2019 2032    CBC    Component Value Date/Time   WBC 10.4 04/05/2019 0547   RBC 3.01 (L) 04/05/2019 0547   HGB 9.0 (L) 04/05/2019 0547   HCT 27.1 (L) 04/05/2019 0547   PLT 302 04/05/2019 0547   MCV 90.0 04/05/2019 0547   MCH 29.9 04/05/2019 0547   MCHC 33.2 04/05/2019 0547   RDW 14.9 04/05/2019 0547   LYMPHSABS 0.6 (L) 04/05/2019 0547   MONOABS 0.5 04/05/2019 0547   EOSABS 0.0 04/05/2019 0547   BASOSABS 0.0 04/05/2019 0547    ASSESSMENT AND PLAN: 1. Hospital discharge follow-up 2. Upper gastrointestinal bleed Advise not to take any NSAIDS or ASA - pantoprazole (PROTONIX) 40 MG tablet; Take 1 tablet (40 mg total) by mouth 2 (two) times daily before a meal.  Dispense: 60 tablet; Refill: 4  3. Normocytic anemia  Advise to ask nephrologist whether he needs to take oral iron given that he has received iron infusion at HD.  Pt decline CBC draw since levels are check during HD - ferrous sulfate 325 (65 FE) MG tablet; Take 1 tablet (325 mg total) by mouth daily with breakfast.  Dispense: 30 tablet; Refill: 0  4. ESRD (end stage renal disease) ([place]) Likely due to prolong uncontrolled HTN.  Now on HD  5. Essential hypertension - amLODipine (NORVASC) 10 MG tablet; Take 1 tablet (10 mg total) by mouth daily.  Dispense: 30 tablet; Refill: 3 - hydrALAZINE (APRESOLINE) 100 MG tablet; Take 1 tablet (100 mg total) by mouth every 8 (eight) hours.  Dispense: 90 tablet; Refill: 3 - carvedilol (COREG) 25 MG tablet; Take 1 tablet (25 mg total) by mouth 2 (two) times daily with a meal.  Dispense: 60 tablet; Refill: 3 - isosorbide dinitrate (ISORDIL) 30 MG tablet; Take 1 tablet (30 mg total) by mouth 3 (three) times daily.  Dispense: 90 tablet; Refill: 3  6. Chest wall pain Advise to take regular Tylenol PRN  7. Former smoker Encouraged him to remain free of cigarettes.  Discussed health risk associated with smoking.  Less than 5 mins spent on counseling  8. Positive depression screening Pt feels he would benefit from being on medication for depression/anxiety.  Denies any SI.  Seen by LCSW today. Pt agreeable to trying Zoloft.  Pt told it will take about 4 wks before he begins to feel better on Zoloft - sertraline (ZOLOFT) 50 MG tablet; 1/2 tab daily PO x 2 wks then 1 tab PO daily.  For anxiety  Dispense: 30 tablet; Refill: 2  9. Anxiety state See #8 above - sertraline (ZOLOFT) 50 MG tablet; 1/2 tab daily PO x 2 wks then  1 tab PO daily.  For anxiety  Dispense: 30 tablet; Refill: 2  10. Influenza vaccination declined  Form completed allowing him to get a handicap sticker. Spent 30 mins with pt in direct face to face eval, discussing dx and care and coordinating care. Patient was given the opportunity to ask  questions.  Patient verbalized understanding of the plan and was able to repeat key elements of the plan.   No orders of the defined types were placed in this encounter.    Requested Prescriptions   Signed Prescriptions Disp Refills  . sertraline (ZOLOFT) 50 MG tablet 30 tablet 2    Sig: 1/2 tab daily PO x 2 wks then 1 tab PO daily.  For anxiety  . amLODipine (NORVASC) 10 MG tablet 30 tablet 3    Sig: Take 1 tablet (10 mg total) by mouth daily.  . hydrALAZINE (APRESOLINE) 100 MG tablet 90 tablet 3    Sig: Take 1 tablet (100 mg total) by mouth every 8 (eight) hours.  . carvedilol (COREG) 25 MG tablet 60 tablet 3    Sig: Take 1 tablet (25 mg total) by mouth 2 (two) times daily with a meal.  . isosorbide dinitrate (ISORDIL) 30 MG tablet 90 tablet 3    Sig: Take 1 tablet (30 mg total) by mouth 3 (three) times daily.  . pantoprazole (PROTONIX) 40 MG tablet 60 tablet 4    Sig: Take 1 tablet (40 mg total) by mouth 2 (two) times daily before a meal.  . ferrous sulfate 325 (65 FE) MG tablet 30 tablet 0    Sig: Take 1 tablet (325 mg total) by mouth daily with breakfast.    Return in about 2 months (around 07/12/2019).  Karle Plumber, MD, FACP

## 2019-05-13 NOTE — BH Specialist Note (Signed)
Integrated Behavioral Health Initial Visit  MRN: [id number] Name: [name]  Number of [place] Clinician visits:: 1/6 Session Start time: 9:45 AM  Session End time: 9:55 AM Total time: 10 minutes  Type of Service: [place] Interpretor:No. Interpretor Name and Language: na   Warm Hand Off Completed.       SUBJECTIVE: [name] is a 28 y.o. male accompanied by self Patient was referred by Dr. Wynetta Emery for anxiety and depression. Patient reports the following symptoms/concerns: Pt reports difficulty managing mental health triggered by management of chronic medical conditions Duration of problem: 1 month; Severity of problem: moderate  OBJECTIVE: Mood: Anxious and Pleasant and Affect: Appropriate Risk of harm to self or others: No plan to harm self or others Pt scored positive on phq9; however, denies active suicidal or homicidal ideations and/or plan  LIFE CONTEXT: Family and Social: Pt receives strong support from friends School/Work: Pt is unemployed and uninsured Banker pending) Self-Care: Pt agreed to participate in medication management through PCP Life Changes: Pt reports difficulty managing mental health triggered by management of chronic medical conditions  GOALS ADDRESSED: Patient will: 1. Reduce symptoms of: anxiety and depression 2. Increase knowledge and/or ability of: coping skills  3. Demonstrate ability to: Increase healthy adjustment to current life circumstances  INTERVENTIONS: Interventions utilized: Psychoeducation and/or Health Education  Standardized Assessments completed: GAD-7 and PHQ 2&9  ASSESSMENT: Patient currently experiencing symptoms of depression and anxiety triggered by recent hospitalization due to chronic medical conditions. Pt scored positive on phq9; however, denies current suicidal or homicidal ideations. He receives strong support from friends, who assist him with  transportation.   Patient may benefit from psychotherapy. He agreed to participate in medication management through PCP. LCSW informed pt of correlation between one's physical and mental health. Validation and support was provided. LCSW encouraged pt to schedule appointment should depression and/or anxiety symptoms increase. Pt verbalized understanding  PLAN: 1. Follow up with behavioral health clinician on : Schedule follow up appt 2. Behavioral recommendations: Schedule follow up appointment 3. Referral(s): [place] (In Clinic) 4. "From scale of 1-10, how likely are you to follow plan?":   Rebekah Chesterfield, LCSW 05/24/2019 4:42 PM

## 2019-05-15 DIAGNOSIS — N186 End stage renal disease: Secondary | ICD-10-CM | POA: Insufficient documentation

## 2019-05-15 DIAGNOSIS — F411 Generalized anxiety disorder: Secondary | ICD-10-CM | POA: Insufficient documentation

## 2019-05-15 DIAGNOSIS — I10 Essential (primary) hypertension: Secondary | ICD-10-CM | POA: Insufficient documentation

## 2019-05-15 DIAGNOSIS — Z1331 Encounter for screening for depression: Secondary | ICD-10-CM | POA: Insufficient documentation

## 2019-05-15 DIAGNOSIS — Z87891 Personal history of nicotine dependence: Secondary | ICD-10-CM | POA: Insufficient documentation

## 2019-05-15 DIAGNOSIS — D649 Anemia, unspecified: Secondary | ICD-10-CM | POA: Insufficient documentation

## 2019-05-17 ENCOUNTER — Inpatient Hospital Stay (HOSPITAL_COMMUNITY): Admit: 2019-05-17 | Payer: Self-pay

## 2019-05-17 NOTE — Progress Notes (Deleted)
  POST OPERATIVE OFFICE NOTE    CC:  F/u for surgery  HPI:  This is a 28 y.o. male who is s/p  s/p left 1st stage BVT.  He is here today for f/u and evaluation of fistula maturity to proceed with second stage basilic fistula transposition.   No Known Allergies  Current Outpatient Medications  Medication Sig Dispense Refill  . amLODipine (NORVASC) 10 MG tablet Take 1 tablet (10 mg total) by mouth daily. 30 tablet 3  . carvedilol (COREG) 25 MG tablet Take 1 tablet (25 mg total) by mouth 2 (two) times daily with a meal. 60 tablet 3  . ferrous sulfate 325 (65 FE) MG tablet Take 1 tablet (325 mg total) by mouth daily with breakfast. 30 tablet 0  . hydrALAZINE (APRESOLINE) 100 MG tablet Take 1 tablet (100 mg total) by mouth every 8 (eight) hours. 90 tablet 3  . isosorbide dinitrate (ISORDIL) 30 MG tablet Take 1 tablet (30 mg total) by mouth 3 (three) times daily. 90 tablet 3  . pantoprazole (PROTONIX) 40 MG tablet Take 1 tablet (40 mg total) by mouth 2 (two) times daily before a meal. 60 tablet 4  . sertraline (ZOLOFT) 50 MG tablet 1/2 tab daily PO x 2 wks then 1 tab PO daily.  For anxiety 30 tablet 2   No current facility-administered medications for this visit.      ROS:  See HPI  Physical Exam:  ***  Incision:  *** Extremities:  *** Neuro: *** Abdomen:  ***  Assessment/Plan:  This is a 28 y.o. male who is s/p: ***  -***   Leontine Locket, PA-C [facility] [phone]  Clinic MD:  ***

## 2019-05-24 ENCOUNTER — Other Ambulatory Visit: Payer: Self-pay

## 2019-05-24 DIAGNOSIS — Z992 Dependence on renal dialysis: Secondary | ICD-10-CM

## 2019-05-24 DIAGNOSIS — N186 End stage renal disease: Secondary | ICD-10-CM

## 2019-05-25 ENCOUNTER — Other Ambulatory Visit: Payer: Self-pay

## 2019-05-25 ENCOUNTER — Ambulatory Visit (HOSPITAL_COMMUNITY)
Admission: RE | Admit: 2019-05-25 | Discharge: 2019-05-25 | Disposition: A | Discharge status: Home / Self Care | Payer: Medicaid Other | Source: Ambulatory Visit | Attending: Vascular Surgery | Admitting: Vascular Surgery

## 2019-05-25 ENCOUNTER — Ambulatory Visit (INDEPENDENT_AMBULATORY_CARE_PROVIDER_SITE_OTHER): Payer: Self-pay | Admitting: Physician Assistant

## 2019-05-25 ENCOUNTER — Encounter: Payer: Self-pay | Admitting: *Deleted

## 2019-05-25 ENCOUNTER — Other Ambulatory Visit: Payer: Self-pay | Admitting: *Deleted

## 2019-05-25 VITALS — BP 147/97 | HR 87 | Temp 98.2°F | Resp 16 | Ht 70.0 in | Wt 185.0 lb

## 2019-05-25 DIAGNOSIS — Z992 Dependence on renal dialysis: Secondary | ICD-10-CM | POA: Diagnosis present

## 2019-05-25 DIAGNOSIS — N186 End stage renal disease: Secondary | ICD-10-CM | POA: Diagnosis not present

## 2019-05-25 NOTE — H&P (View-Only) (Signed)
  POST OPERATIVE OFFICE NOTE    CC:  F/u for surgery  HPI:  This is a 28 y.o. male who is s/p left brachiobasilic AV fistula creation on 04/04/2019 by Dr. Donnetta Hutching.  Denise pain, loss of motor or sensation.  He is here today for follow up evaluation.     He is on HD TTS via right TDC.  No Known Allergies  Current Outpatient Medications  Medication Sig Dispense Refill  . amLODipine (NORVASC) 10 MG tablet Take 1 tablet (10 mg total) by mouth daily. 30 tablet 3  . carvedilol (COREG) 25 MG tablet Take 1 tablet (25 mg total) by mouth 2 (two) times daily with a meal. 60 tablet 3  . ferrous sulfate 325 (65 FE) MG tablet Take 1 tablet (325 mg total) by mouth daily with breakfast. 30 tablet 0  . hydrALAZINE (APRESOLINE) 100 MG tablet Take 1 tablet (100 mg total) by mouth every 8 (eight) hours. 90 tablet 3  . isosorbide dinitrate (ISORDIL) 30 MG tablet Take 1 tablet (30 mg total) by mouth 3 (three) times daily. 90 tablet 3  . pantoprazole (PROTONIX) 40 MG tablet Take 1 tablet (40 mg total) by mouth 2 (two) times daily before a meal. 60 tablet 4  . sertraline (ZOLOFT) 50 MG tablet 1/2 tab daily PO x 2 wks then 1 tab PO daily.  For anxiety 30 tablet 2   No current facility-administered medications for this visit.      ROS:  See HPI  Physical Exam:  +------------+----------+-------------+----------+--------+ OUTFLOW VEINPSV (cm/s)Diameter (cm)Depth (cm)Describe +------------+----------+-------------+----------+--------+ Prox UA         91        0.94        0.79            +------------+----------+-------------+----------+--------+ Mid UA         111        0.92        0.79            +------------+----------+-------------+----------+--------+ Dist UA        112        0.90        0.52            +------------+----------+-------------+----------+--------+ AC Fossa       530        0.47        0.20             +------------+----------+-------------+----------+--------+      Incision:  Well healed Extremities:  Palpable radial pulse, palpable thrill in distal basilic Grip 5/5   Assessment/Plan:  This is a 28 y.o. male who is s/p: First stage basilic fistula creation  He has a well matured av fistula.  I have scheduled him for second stage transposition.  HD TTS.     Roxy Horseman PA-C [facility] [phone]  Clinic MD:  [facility]

## 2019-05-25 NOTE — Progress Notes (Signed)
  POST OPERATIVE OFFICE NOTE    CC:  F/u for surgery  HPI:  This is a 28 y.o. male who is s/p left brachiobasilic AV fistula creation on 04/04/2019 by Dr. Donnetta Hutching.  Denise pain, loss of motor or sensation.  He is here today for follow up evaluation.     He is on HD TTS via right TDC.  No Known Allergies  Current Outpatient Medications  Medication Sig Dispense Refill  . amLODipine (NORVASC) 10 MG tablet Take 1 tablet (10 mg total) by mouth daily. 30 tablet 3  . carvedilol (COREG) 25 MG tablet Take 1 tablet (25 mg total) by mouth 2 (two) times daily with a meal. 60 tablet 3  . ferrous sulfate 325 (65 FE) MG tablet Take 1 tablet (325 mg total) by mouth daily with breakfast. 30 tablet 0  . hydrALAZINE (APRESOLINE) 100 MG tablet Take 1 tablet (100 mg total) by mouth every 8 (eight) hours. 90 tablet 3  . isosorbide dinitrate (ISORDIL) 30 MG tablet Take 1 tablet (30 mg total) by mouth 3 (three) times daily. 90 tablet 3  . pantoprazole (PROTONIX) 40 MG tablet Take 1 tablet (40 mg total) by mouth 2 (two) times daily before a meal. 60 tablet 4  . sertraline (ZOLOFT) 50 MG tablet 1/2 tab daily PO x 2 wks then 1 tab PO daily.  For anxiety 30 tablet 2   No current facility-administered medications for this visit.      ROS:  See HPI  Physical Exam:  +------------+----------+-------------+----------+--------+ OUTFLOW VEINPSV (cm/s)Diameter (cm)Depth (cm)Describe +------------+----------+-------------+----------+--------+ Prox UA         91        0.94        0.79            +------------+----------+-------------+----------+--------+ Mid UA         111        0.92        0.79            +------------+----------+-------------+----------+--------+ Dist UA        112        0.90        0.52            +------------+----------+-------------+----------+--------+ AC Fossa       530        0.47        0.20             +------------+----------+-------------+----------+--------+      Incision:  Well healed Extremities:  Palpable radial pulse, palpable thrill in distal basilic Grip 5/5   Assessment/Plan:  This is a 28 y.o. male who is s/p: First stage basilic fistula creation  He has a well matured av fistula.  I have scheduled him for second stage transposition.  HD TTS.     Roxy Horseman PA-C [facility] [phone]  Clinic MD:  [facility]

## 2019-06-06 ENCOUNTER — Inpatient Hospital Stay (HOSPITAL_COMMUNITY)
Admission: RE | Admit: 2019-06-06 | Discharge: 2019-06-06 | Disposition: A | Discharge status: Home / Self Care | Payer: Medicaid Other | Source: Ambulatory Visit

## 2019-06-06 NOTE — Progress Notes (Signed)
Contacted patient for missed covid testing appointment, left call back number

## 2019-06-07 ENCOUNTER — Other Ambulatory Visit: Payer: Self-pay

## 2019-06-07 ENCOUNTER — Other Ambulatory Visit (HOSPITAL_COMMUNITY)
Admission: RE | Admit: 2019-06-07 | Discharge: 2019-06-07 | Disposition: A | Discharge status: Home / Self Care | Payer: Medicaid Other | Source: Ambulatory Visit | Attending: Vascular Surgery | Admitting: Vascular Surgery

## 2019-06-07 ENCOUNTER — Encounter (HOSPITAL_COMMUNITY): Payer: Self-pay | Admitting: *Deleted

## 2019-06-07 DIAGNOSIS — Z01812 Encounter for preprocedural laboratory examination: Secondary | ICD-10-CM | POA: Insufficient documentation

## 2019-06-07 DIAGNOSIS — Z20828 Contact with and (suspected) exposure to other viral communicable diseases: Secondary | ICD-10-CM | POA: Diagnosis not present

## 2019-06-07 LAB — SARS CORONAVIRUS 2 (TAT 6-24 HRS): SARS Coronavirus 2: NEGATIVE

## 2019-06-07 NOTE — Progress Notes (Addendum)
I called a couple of numbers listed for patient and have not reached him.  I called [name] , patient is there at this time. Staff confirmed a phone number with patient, he said battery is dead, he will charge phone when he gets home.  [name] called me, hedenies chest pain or shortness of breath. Patient was tested for Covid went to dialysis and is now at home in quarantine.

## 2019-06-08 ENCOUNTER — Encounter (HOSPITAL_COMMUNITY)
Admission: RE | Disposition: A | Discharge status: Home / Self Care | Payer: Self-pay | Source: Home / Self Care | Attending: Vascular Surgery

## 2019-06-08 ENCOUNTER — Encounter (HOSPITAL_COMMUNITY): Payer: Self-pay | Admitting: Orthopedic Surgery

## 2019-06-08 ENCOUNTER — Ambulatory Visit (HOSPITAL_COMMUNITY): Payer: Medicaid Other | Admitting: Anesthesiology

## 2019-06-08 ENCOUNTER — Ambulatory Visit (HOSPITAL_COMMUNITY)
Admission: RE | Admit: 2019-06-08 | Discharge: 2019-06-08 | Disposition: A | Discharge status: Home / Self Care | Payer: Medicaid Other | Attending: Vascular Surgery | Admitting: Vascular Surgery

## 2019-06-08 DIAGNOSIS — N186 End stage renal disease: Secondary | ICD-10-CM | POA: Diagnosis not present

## 2019-06-08 DIAGNOSIS — D631 Anemia in chronic kidney disease: Secondary | ICD-10-CM | POA: Insufficient documentation

## 2019-06-08 DIAGNOSIS — Z87891 Personal history of nicotine dependence: Secondary | ICD-10-CM | POA: Insufficient documentation

## 2019-06-08 DIAGNOSIS — Z79899 Other long term (current) drug therapy: Secondary | ICD-10-CM | POA: Insufficient documentation

## 2019-06-08 DIAGNOSIS — Z992 Dependence on renal dialysis: Secondary | ICD-10-CM | POA: Insufficient documentation

## 2019-06-08 DIAGNOSIS — I120 Hypertensive chronic kidney disease with stage 5 chronic kidney disease or end stage renal disease: Secondary | ICD-10-CM | POA: Diagnosis not present

## 2019-06-08 DIAGNOSIS — N185 Chronic kidney disease, stage 5: Secondary | ICD-10-CM | POA: Diagnosis not present

## 2019-06-08 HISTORY — PX: BASCILIC VEIN TRANSPOSITION: SHX5742

## 2019-06-08 HISTORY — DX: End stage renal disease: N18.6

## 2019-06-08 HISTORY — DX: Dyspnea, unspecified: R06.00

## 2019-06-08 LAB — POCT I-STAT, CHEM 8
BUN: 9 mg/dL (ref 6–20)
Calcium, Ion: 0.91 mmol/L — ABNORMAL LOW (ref 1.15–1.40)
Chloride: 100 mmol/L (ref 98–111)
Creatinine, Ser: 7.4 mg/dL — ABNORMAL HIGH (ref 0.61–1.24)
Glucose, Bld: 85 mg/dL (ref 70–99)
HCT: 30 % — ABNORMAL LOW (ref 39.0–52.0)
Hemoglobin: 10.2 g/dL — ABNORMAL LOW (ref 13.0–17.0)
Potassium: 3.3 mmol/L — ABNORMAL LOW (ref 3.5–5.1)
Sodium: 136 mmol/L (ref 135–145)
TCO2: 29 mmol/L (ref 22–32)

## 2019-06-08 SURGERY — TRANSPOSITION, VEIN, BASILIC
Anesthesia: General | Site: Arm Upper | Laterality: Left

## 2019-06-08 MED ORDER — MIDAZOLAM HCL 2 MG/2ML IJ SOLN
INTRAMUSCULAR | Status: AC
  Filled 2019-06-08: qty 2

## 2019-06-08 MED ORDER — DEXAMETHASONE SODIUM PHOSPHATE 10 MG/ML IJ SOLN
INTRAMUSCULAR | Status: AC
  Filled 2019-06-08: qty 1

## 2019-06-08 MED ORDER — PHENYLEPHRINE 40 MCG/ML (10ML) SYRINGE FOR IV PUSH (FOR BLOOD PRESSURE SUPPORT)
PREFILLED_SYRINGE | INTRAVENOUS | Status: DC | PRN
  Administered 2019-06-08 (×2): 80 ug via INTRAVENOUS
  Administered 2019-06-08 (×2): 40 ug via INTRAVENOUS
  Administered 2019-06-08: 80 ug via INTRAVENOUS

## 2019-06-08 MED ORDER — FENTANYL CITRATE (PF) 100 MCG/2ML IJ SOLN: 25.0000 ug | INTRAMUSCULAR | Status: DC | PRN

## 2019-06-08 MED ORDER — LIDOCAINE-EPINEPHRINE 0.5 %-1:200000 IJ SOLN
INTRAMUSCULAR | Status: AC
  Filled 2019-06-08: qty 1

## 2019-06-08 MED ORDER — ACETAMINOPHEN 160 MG/5ML PO SOLN: 1000.0000 mg | Freq: Once | ORAL | Status: DC | PRN

## 2019-06-08 MED ORDER — PROPOFOL 10 MG/ML IV BOLUS
INTRAVENOUS | Status: AC
  Filled 2019-06-08: qty 20

## 2019-06-08 MED ORDER — GLYCOPYRROLATE PF 0.2 MG/ML IJ SOSY
PREFILLED_SYRINGE | INTRAMUSCULAR | Status: DC | PRN
  Administered 2019-06-08: .1 mg via INTRAVENOUS

## 2019-06-08 MED ORDER — 0.9 % SODIUM CHLORIDE (POUR BTL) OPTIME
TOPICAL | Status: DC | PRN
  Administered 2019-06-08: 1000 mL

## 2019-06-08 MED ORDER — MIDAZOLAM HCL 5 MG/5ML IJ SOLN
INTRAMUSCULAR | Status: DC | PRN
  Administered 2019-06-08 (×2): 1 mg via INTRAVENOUS

## 2019-06-08 MED ORDER — FENTANYL CITRATE (PF) 100 MCG/2ML IJ SOLN
INTRAMUSCULAR | Status: DC | PRN
  Administered 2019-06-08 (×4): 50 ug via INTRAVENOUS

## 2019-06-08 MED ORDER — OXYCODONE HCL 5 MG PO TABS
5.0000 mg | ORAL_TABLET | Freq: Once | ORAL | Status: AC | PRN
  Administered 2019-06-08: 5 mg via ORAL

## 2019-06-08 MED ORDER — LIDOCAINE 2% (20 MG/ML) 5 ML SYRINGE
INTRAMUSCULAR | Status: AC
  Filled 2019-06-08: qty 5

## 2019-06-08 MED ORDER — ALBUTEROL SULFATE HFA 108 (90 BASE) MCG/ACT IN AERS
INHALATION_SPRAY | RESPIRATORY_TRACT | Status: AC
  Filled 2019-06-08: qty 6.7

## 2019-06-08 MED ORDER — CEFAZOLIN SODIUM-DEXTROSE 2-4 GM/100ML-% IV SOLN
2.0000 g | INTRAVENOUS | Status: AC
  Administered 2019-06-08: 12:00:00 2 g via INTRAVENOUS

## 2019-06-08 MED ORDER — FENTANYL CITRATE (PF) 250 MCG/5ML IJ SOLN
INTRAMUSCULAR | Status: AC
  Filled 2019-06-08: qty 5

## 2019-06-08 MED ORDER — ACETAMINOPHEN 10 MG/ML IV SOLN: 1000.0000 mg | Freq: Once | INTRAVENOUS | Status: DC | PRN

## 2019-06-08 MED ORDER — ACETAMINOPHEN 500 MG PO TABS: 1000.0000 mg | ORAL_TABLET | Freq: Once | ORAL | Status: DC | PRN

## 2019-06-08 MED ORDER — CHLORHEXIDINE GLUCONATE 4 % EX LIQD: 60.0000 mL | Freq: Once | CUTANEOUS | Status: DC

## 2019-06-08 MED ORDER — OXYCODONE HCL 5 MG/5ML PO SOLN: 5.0000 mg | Freq: Once | ORAL | Status: AC | PRN

## 2019-06-08 MED ORDER — PHENYLEPHRINE 40 MCG/ML (10ML) SYRINGE FOR IV PUSH (FOR BLOOD PRESSURE SUPPORT)
PREFILLED_SYRINGE | INTRAVENOUS | Status: AC
  Filled 2019-06-08: qty 10

## 2019-06-08 MED ORDER — OXYCODONE-ACETAMINOPHEN 5-325 MG PO TABS: 1.0000 | ORAL_TABLET | Freq: Four times a day (QID) | ORAL | 0 refills | Status: DC | PRN

## 2019-06-08 MED ORDER — SODIUM CHLORIDE 0.9 % IV SOLN
INTRAVENOUS | Status: DC | PRN
  Administered 2019-06-08: 40 ug/min via INTRAVENOUS

## 2019-06-08 MED ORDER — SODIUM CHLORIDE 0.9 % IV SOLN
INTRAVENOUS | Status: DC | PRN
  Administered 2019-06-08: 500 mL

## 2019-06-08 MED ORDER — OXYCODONE HCL 5 MG PO TABS
ORAL_TABLET | ORAL | Status: AC
  Filled 2019-06-08: qty 1

## 2019-06-08 MED ORDER — SODIUM CHLORIDE 0.9 % IV SOLN
INTRAVENOUS | Status: AC
  Filled 2019-06-08: qty 1.2

## 2019-06-08 MED ORDER — EPHEDRINE 5 MG/ML INJ
INTRAVENOUS | Status: AC
  Filled 2019-06-08: qty 10

## 2019-06-08 MED ORDER — ONDANSETRON HCL 4 MG/2ML IJ SOLN
INTRAMUSCULAR | Status: DC | PRN
  Administered 2019-06-08: 4 mg via INTRAVENOUS

## 2019-06-08 MED ORDER — DEXAMETHASONE SODIUM PHOSPHATE 4 MG/ML IJ SOLN
INTRAMUSCULAR | Status: DC | PRN
  Administered 2019-06-08: 6 mg via INTRAVENOUS

## 2019-06-08 MED ORDER — EPHEDRINE SULFATE-NACL 50-0.9 MG/10ML-% IV SOSY
PREFILLED_SYRINGE | INTRAVENOUS | Status: DC | PRN
  Administered 2019-06-08 (×2): 5 mg via INTRAVENOUS

## 2019-06-08 MED ORDER — SODIUM CHLORIDE 0.9 % IV SOLN
INTRAVENOUS | Status: DC
  Administered 2019-06-08: 09:00:00 via INTRAVENOUS

## 2019-06-08 MED ORDER — ONDANSETRON HCL 4 MG/2ML IJ SOLN
INTRAMUSCULAR | Status: AC
  Filled 2019-06-08: qty 2

## 2019-06-08 MED ORDER — GLYCOPYRROLATE PF 0.2 MG/ML IJ SOSY
PREFILLED_SYRINGE | INTRAMUSCULAR | Status: AC
  Filled 2019-06-08: qty 1

## 2019-06-08 MED ORDER — LIDOCAINE 2% (20 MG/ML) 5 ML SYRINGE
INTRAMUSCULAR | Status: DC | PRN
  Administered 2019-06-08: 60 mg via INTRAVENOUS

## 2019-06-08 MED ORDER — SODIUM CHLORIDE 0.9 % IV SOLN
INTRAVENOUS | Status: DC
  Administered 2019-06-08 (×2): via INTRAVENOUS

## 2019-06-08 MED ORDER — CEFAZOLIN SODIUM-DEXTROSE 2-4 GM/100ML-% IV SOLN
INTRAVENOUS | Status: AC
  Filled 2019-06-08: qty 100

## 2019-06-08 MED ORDER — PROPOFOL 10 MG/ML IV BOLUS
INTRAVENOUS | Status: DC | PRN
  Administered 2019-06-08: 200 mg via INTRAVENOUS

## 2019-06-08 SURGICAL SUPPLY — 43 items
ARMBAND PINK RESTRICT EXTREMIT (MISCELLANEOUS) ×3 IMPLANT
BNDG ELASTIC 4X5.8 VLCR STR LF (GAUZE/BANDAGES/DRESSINGS) ×3 IMPLANT
BNDG GAUZE ELAST 4 BULKY (GAUZE/BANDAGES/DRESSINGS) ×3 IMPLANT
CANISTER SUCT 3000ML PPV (MISCELLANEOUS) ×3 IMPLANT
CANNULA VESSEL 3MM 2 BLNT TIP (CANNULA) ×3 IMPLANT
CLIP LIGATING EXTRA MED SLVR (CLIP) ×3 IMPLANT
CLIP LIGATING EXTRA SM BLUE (MISCELLANEOUS) ×3 IMPLANT
COVER PROBE W GEL 5X96 (DRAPES) IMPLANT
COVER WAND RF STERILE (DRAPES) IMPLANT
DECANTER SPIKE VIAL GLASS SM (MISCELLANEOUS) IMPLANT
DERMABOND ADVANCED (GAUZE/BANDAGES/DRESSINGS) ×4
DERMABOND ADVANCED .7 DNX12 (GAUZE/BANDAGES/DRESSINGS) ×2 IMPLANT
ELECT REM PT RETURN 9FT ADLT (ELECTROSURGICAL) ×3
ELECTRODE REM PT RTRN 9FT ADLT (ELECTROSURGICAL) ×1 IMPLANT
GLOVE BIO SURGEON STRL SZ 6.5 (GLOVE) ×2 IMPLANT
GLOVE BIO SURGEONS STRL SZ 6.5 (GLOVE) ×1
GLOVE BIOGEL PI IND STRL 6.5 (GLOVE) ×2 IMPLANT
GLOVE BIOGEL PI IND STRL 7.0 (GLOVE) ×2 IMPLANT
GLOVE BIOGEL PI IND STRL 7.5 (GLOVE) ×2 IMPLANT
GLOVE BIOGEL PI INDICATOR 6.5 (GLOVE) ×4
GLOVE BIOGEL PI INDICATOR 7.0 (GLOVE) ×4
GLOVE BIOGEL PI INDICATOR 7.5 (GLOVE) ×4
GLOVE ECLIPSE 6.5 STRL STRAW (GLOVE) ×3 IMPLANT
GLOVE ECLIPSE 7.0 STRL STRAW (GLOVE) ×6 IMPLANT
GLOVE ECLIPSE 7.5 STRL STRAW (GLOVE) ×3 IMPLANT
GLOVE SS BIOGEL STRL SZ 7.5 (GLOVE) ×1 IMPLANT
GLOVE SUPERSENSE BIOGEL SZ 7.5 (GLOVE) ×2
GOWN STRL REUS W/ TWL LRG LVL3 (GOWN DISPOSABLE) ×4 IMPLANT
GOWN STRL REUS W/ TWL XL LVL3 (GOWN DISPOSABLE) IMPLANT
GOWN STRL REUS W/TWL LRG LVL3 (GOWN DISPOSABLE) ×8
GOWN STRL REUS W/TWL XL LVL3 (GOWN DISPOSABLE)
KIT BASIN OR (CUSTOM PROCEDURE TRAY) ×3 IMPLANT
KIT TURNOVER KIT B (KITS) ×3 IMPLANT
NS IRRIG 1000ML POUR BTL (IV SOLUTION) ×3 IMPLANT
PACK CV ACCESS (CUSTOM PROCEDURE TRAY) ×3 IMPLANT
PAD ARMBOARD 7.5X6 YLW CONV (MISCELLANEOUS) ×6 IMPLANT
SUT PROLENE 6 0 CC (SUTURE) ×3 IMPLANT
SUT SILK 2 0 SH (SUTURE) ×3 IMPLANT
SUT VIC AB 3-0 SH 27 (SUTURE) ×6
SUT VIC AB 3-0 SH 27X BRD (SUTURE) ×3 IMPLANT
TOWEL GREEN STERILE (TOWEL DISPOSABLE) ×3 IMPLANT
UNDERPAD 30X30 (UNDERPADS AND DIAPERS) ×3 IMPLANT
WATER STERILE IRR 1000ML POUR (IV SOLUTION) ×3 IMPLANT

## 2019-06-08 NOTE — Anesthesia Preprocedure Evaluation (Signed)
Anesthesia Evaluation  Patient identified by MRN, date of birth, ID band Patient awake    Reviewed: Allergy & Precautions, NPO status , Patient's Chart, lab work & pertinent test results  History of Anesthesia Complications Negative for: history of anesthetic complications  Airway Mallampati: II  TM Distance: >3 FB Neck ROM: Full    Dental  (+) Chipped, Teeth Intact, Dental Advisory Given,    Pulmonary neg COPD, neg recent URI, former smoker,    breath sounds clear to auscultation       Cardiovascular hypertension, (-) angina(-) Past MI and (-) CHF  Rhythm:Regular     Neuro/Psych negative neurological ROS  negative psych ROS   GI/Hepatic Neg liver ROS, PUD,   Endo/Other    Renal/GU ESRF and DialysisRenal disease     Musculoskeletal   Abdominal   Peds  Hematology  (+) anemia ,   Anesthesia Other Findings   Reproductive/Obstetrics                             Anesthesia Physical Anesthesia Plan  ASA: III  Anesthesia Plan: General   Post-op Pain Management:    Induction: Intravenous  PONV Risk Score and Plan: 2 and Ondansetron and Dexamethasone  Airway Management Planned: Oral ETT and LMA  Additional Equipment: None  Intra-op Plan:   Post-operative Plan: Extubation in OR  Informed Consent: I have reviewed the patients History and Physical, chart, labs and discussed the procedure including the risks, benefits and alternatives for the proposed anesthesia with the patient or authorized representative who has indicated his/her understanding and acceptance.     Dental advisory given  Plan Discussed with: CRNA and Surgeon  Anesthesia Plan Comments:         Anesthesia Quick Evaluation

## 2019-06-08 NOTE — Anesthesia Procedure Notes (Signed)
Procedure Name: LMA Insertion Date/Time: 06/08/2019 11:24 AM Performed by: Orlie Dakin, CRNA Pre-anesthesia Checklist: Patient identified, Emergency Drugs available, Suction available and Patient being monitored Patient Re-evaluated:Patient Re-evaluated prior to induction Oxygen Delivery Method: Circle system utilized Preoxygenation: Pre-oxygenation with 100% oxygen Induction Type: IV induction LMA: LMA inserted LMA Size: 5.0 Tube type: Oral Number of attempts: 1 Placement Confirmation: positive ETCO2 Tube secured with: Tape Dental Injury: Teeth and Oropharynx as per pre-operative assessment

## 2019-06-08 NOTE — Interval H&P Note (Signed)
History and Physical Interval Note:  06/08/2019 10:50 AM  [name]  has presented today for surgery, with the diagnosis of END STAGE RENAL DISEASE FOR HEMODIALYSIS.  The various methods of treatment have been discussed with the patient and family. After consideration of risks, benefits and other options for treatment, the patient has consented to  Procedure(s): SECOND STAGE BASILIC VEIN TRANSPOSITION LEFT ARM (Left) as a surgical intervention.  The patient's history has been reviewed, patient examined, no change in status, stable for surgery.  I have reviewed the patient's chart and labs.  Questions were answered to the patient's satisfaction.     Curt Jews

## 2019-06-08 NOTE — Discharge Instructions (Signed)
° °  [facility] ° °Discharge Instructions ° °AV Fistula or Graft Surgery for Dialysis Access ° °Please refer to the following instructions for your post-procedure care. Your surgeon or physician assistant will discuss any changes with you. ° °Activity ° °You may drive the day following your surgery, if you are comfortable and no longer taking prescription pain medication. Resume full activity as the soreness in your incision resolves. ° °Bathing/Showering ° °You may shower after you go home. Keep your incision dry for 48 hours. Do not soak in a bathtub, hot tub, or swim until the incision heals completely. You may not shower if you have a hemodialysis catheter. ° °Incision Care ° °Clean your incision with mild soap and water after 48 hours. Pat the area dry with a clean towel. You do not need a bandage unless otherwise instructed. Do not apply any ointments or creams to your incision. You may have skin glue on your incision. Do not peel it off. It will come off on its own in about one week. Your arm may swell a bit after surgery. To reduce swelling use pillows to elevate your arm so it is above your heart. Your doctor will tell you if you need to lightly wrap your arm with an ACE bandage. ° °Diet ° °Resume your normal diet. There are not special food restrictions following this procedure. In order to heal from your surgery, it is CRITICAL to get adequate nutrition. Your body requires vitamins, minerals, and protein. Vegetables are the best source of vitamins and minerals. Vegetables also provide the perfect balance of protein. Processed food has little nutritional value, so try to avoid this. ° °Medications ° °Resume taking all of your medications. If your incision is causing pain, you may take over-the counter pain relievers such as acetaminophen (Tylenol). If you were prescribed a stronger pain medication, please be aware these medications can cause nausea and constipation. Prevent  nausea by taking the medication with a snack or meal. Avoid constipation by drinking plenty of fluids and eating foods with high amount of fiber, such as fruits, vegetables, and grains. Do not take Tylenol if you are taking prescription pain medications. ° ° ° ° °Follow up °Your surgeon may want to see you in the office following your access surgery. If so, this will be arranged at the time of your surgery. ° °Please call us immediately for any of the following conditions: ° °Increased pain, redness, drainage (pus) from your incision site °Fever of 101 degrees or higher °Severe or worsening pain at your incision site °Hand pain or numbness. ° °Reduce your risk of vascular disease: ° °Stop smoking. If you would like help, call [facility] at 1-800-QUIT-NOW (1-800-784-8669) or [place] at 336-586-4000 ° °Manage your cholesterol °Maintain a desired weight °Control your diabetes °Keep your blood pressure down ° °Dialysis ° °It will take several weeks to several months for your new dialysis access to be ready for use. Your surgeon will determine when it is OK to use it. Your nephrologist will continue to direct your dialysis. You can continue to use your Permcath until your new access is ready for use. ° °If you have any questions, please call the office at [phone]. ° °

## 2019-06-08 NOTE — Op Note (Signed)
    OPERATIVE REPORT  DATE OF SURGERY: 06/08/2019  PATIENT: [name], 28 y.o. male MRN: [id number]  DOB: 12/16/1990  PRE-OPERATIVE DIAGNOSIS: End-stage renal disease  POST-OPERATIVE DIAGNOSIS:  Same  PROCEDURE: Left second stage basilic vein transposition fistula  SURGEON:  Curt Jews, M.D.  PHYSICIAN ASSISTANT: Gerri Lins, PA-C  ANESTHESIA: General  EBL: per anesthesia record  Total I/O In: 500 [I.V.:500] Out: -   BLOOD ADMINISTERED: none  DRAINS: none  SPECIMEN: none  COUNTS CORRECT:  YES  PATIENT DISPOSITION:  PACU - hemodynamically stable  PROCEDURE DETAILS: Patient was taken up and placed to position where the area of the left arm left axilla were prepped draped you sterile fashion.  The basilic vein was imaged with SonoSite ultrasound was marked on the surface of the skin.  Incision was made over the prior brachial artery anastomosis and 3 separate incisions were made on the medial arm up to the level of the axilla.  The vein was of excellent caliber.  Tributary branches were ligated with 3-0 and 4 silk ties and divided.  The vein was mobilized circumferentially from the antecubital space to the axilla.  The brachial artery was exposed proximal and distal to the old anastomosis and the old anastomosis was taken down.  The vein was marked to reduce risk of twisting.  The vein was gently dilated and was of excellent caliber.  A tunnel was created from the level of the antecubital space to the axillary incision and the vein was brought back through the tunnel.  The vein was cut to the appropriate length and spatulated and sewn end-to-side to the artery with a running 6-0 Prolene suture.  Clamps were removed and excellent thrill was noted.  The wounds irrigated saline.  Hemostasis to electrocautery.  The wounds were closed with 3-0 Vicryl in the subcutaneous and subcuticular tissue.  Sterile dressing was applied and the patient was transferred to the recovery room in  stable condition   [name], M.D., [facility] 06/08/2019 1:41 PM

## 2019-06-08 NOTE — Transfer of Care (Signed)
Immediate Anesthesia Transfer of Care Note  Patient: Thoma Mitra  Procedure(s) Performed: SECOND STAGE BASILIC VEIN TRANSPOSITION LEFT ARM (Left Arm Upper)  Patient Location: PACU  Anesthesia Type:General  Level of Consciousness: drowsy  Airway & Oxygen Therapy: Patient Spontanous Breathing and Patient connected to face mask oxygen  Post-op Assessment: Report given to RN and Post -op Vital signs reviewed and stable  Post vital signs: Reviewed and stable  Last Vitals:  Vitals Value Taken Time  BP 129/83 06/08/19 1343  Temp    Pulse 94 06/08/19 1345  Resp 18 06/08/19 1345  SpO2 96 % 06/08/19 1345  Vitals shown include unvalidated device data.  Last Pain:  Vitals:   06/08/19 0824  TempSrc: Oral         Complications: No apparent anesthesia complications

## 2019-06-08 NOTE — Anesthesia Postprocedure Evaluation (Signed)
Anesthesia Post Note  Patient: [name]  Procedure(s) Performed: SECOND STAGE BASILIC VEIN TRANSPOSITION LEFT ARM (Left Arm Upper)     Patient location during evaluation: PACU Anesthesia Type: General Level of consciousness: awake and alert Pain management: pain level controlled Vital Signs Assessment: post-procedure vital signs reviewed and stable Respiratory status: spontaneous breathing, nonlabored ventilation, respiratory function stable and patient connected to nasal cannula oxygen Cardiovascular status: blood pressure returned to baseline and stable Postop Assessment: no apparent nausea or vomiting Anesthetic complications: no    Last Vitals:  Vitals:   06/08/19 1358 06/08/19 1526  BP: 126/79   Pulse: 91   Resp: 19   Temp:  36.8 C  SpO2: 94%     Last Pain:  Vitals:   06/08/19 1515  TempSrc:   PainSc: 2                  Jex Strausbaugh

## 2019-06-09 ENCOUNTER — Encounter (HOSPITAL_COMMUNITY): Payer: Self-pay | Admitting: Vascular Surgery

## 2019-06-10 ENCOUNTER — Telehealth: Payer: Self-pay | Admitting: Pulmonary Disease

## 2019-06-10 NOTE — Telephone Encounter (Signed)
[place]

## 2019-06-10 NOTE — Telephone Encounter (Signed)
I have attempted to contact this patient several times to schedule the HST. I LVM for patient to call on 05/27/2019. I then tried the phone # of his friend [phone] which he did have as his # on 06/06/2019. I tried to call both the #'s that we have currently for the patient [phone] and the person that answered the phone stated no one was there by that name. I called [phone] and the person that answered the phone stated it belonged to a company. I don't think this patient wants to do the HST that Dr. Ander Slade ordered for him

## 2019-07-08 NOTE — Progress Notes (Deleted)
  POST OPERATIVE OFFICE NOTE    CC:  F/u for surgery  HPI:  This is a 28 y.o. male who is s/p Left second stage basilic vein transposition fistula on 06/08/2019 by Dr. Donnetta Hutching.  He denise pain, loss of motor and loss of sensation.  He is here today to exam his fistula and incisions.    Not on File  Current Outpatient Medications  Medication Sig Dispense Refill  . acetaminophen (TYLENOL) 325 MG tablet Take 325-650 mg by mouth every 6 (six) hours as needed for moderate pain.    Marland Kitchen amLODipine (NORVASC) 10 MG tablet Take 1 tablet (10 mg total) by mouth daily. 30 tablet 3  . carvedilol (COREG) 25 MG tablet Take 1 tablet (25 mg total) by mouth 2 (two) times daily with a meal. 60 tablet 3  . ferrous sulfate 325 (65 FE) MG tablet Take 1 tablet (325 mg total) by mouth daily with breakfast. 30 tablet 0  . hydrALAZINE (APRESOLINE) 100 MG tablet Take 1 tablet (100 mg total) by mouth every 8 (eight) hours. 90 tablet 3  . isosorbide dinitrate (ISORDIL) 30 MG tablet Take 1 tablet (30 mg total) by mouth 3 (three) times daily. (Patient taking differently: Take 30 mg by mouth 3 (three) times daily. Patient picking up medication today- 06/06/2019) 90 tablet 3  . oxyCODONE-acetaminophen (PERCOCET/ROXICET) 5-325 MG tablet Take 1 tablet by mouth every 6 (six) hours as needed. 6 tablet 0  . pantoprazole (PROTONIX) 40 MG tablet Take 1 tablet (40 mg total) by mouth 2 (two) times daily before a meal. 60 tablet 4  . sertraline (ZOLOFT) 50 MG tablet 1/2 tab daily PO x 2 wks then 1 tab PO daily.  For anxiety (Patient taking differently: Take 25-50 mg by mouth See admin instructions. For anxiety Take 25 mg daily for 2 weeks then take 50 mg daily) 30 tablet 2   No current facility-administered medications for this visit.      ROS:  See HPI  Physical Exam:  ***  Incision:  *** Extremities:  *** Neuro: *** Abdomen:  ***  Assessment/Plan:  This is a 28 y.o. male who is s/p: ***  -***   Leontine Locket, PA-C  [facility] [phone]  Clinic MD:  ***

## 2019-11-09 ENCOUNTER — Other Ambulatory Visit: Payer: Self-pay

## 2019-11-09 ENCOUNTER — Encounter (HOSPITAL_COMMUNITY): Payer: Self-pay | Admitting: Emergency Medicine

## 2019-11-09 ENCOUNTER — Ambulatory Visit (HOSPITAL_COMMUNITY)
Admission: EM | Admit: 2019-11-09 | Discharge: 2019-11-09 | Disposition: A | Discharge status: Home / Self Care | Payer: Medicare Other | Attending: Family Medicine | Admitting: Family Medicine

## 2019-11-09 DIAGNOSIS — M545 Low back pain: Secondary | ICD-10-CM | POA: Diagnosis not present

## 2019-11-09 DIAGNOSIS — S161XXA Strain of muscle, fascia and tendon at neck level, initial encounter: Secondary | ICD-10-CM | POA: Diagnosis not present

## 2019-11-09 DIAGNOSIS — M5442 Lumbago with sciatica, left side: Secondary | ICD-10-CM

## 2019-11-09 DIAGNOSIS — M542 Cervicalgia: Secondary | ICD-10-CM

## 2019-11-09 MED ORDER — DEXAMETHASONE SODIUM PHOSPHATE 10 MG/ML IJ SOLN
INTRAMUSCULAR | Status: AC
  Filled 2019-11-09: qty 1

## 2019-11-09 MED ORDER — DEXAMETHASONE SODIUM PHOSPHATE 10 MG/ML IJ SOLN
10.0000 mg | Freq: Once | INTRAMUSCULAR | Status: AC
  Administered 2019-11-09: 10 mg via INTRAMUSCULAR

## 2019-11-09 MED ORDER — CYCLOBENZAPRINE HCL 10 MG PO TABS: 10.0000 mg | ORAL_TABLET | Freq: Two times a day (BID) | ORAL | 0 refills | Status: AC | PRN
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
## 2019-11-09 NOTE — Discharge Instructions (Addendum)
I have given you a steroid shot in the office today to help your back and sciatic nerve.   I have sent in a muscle relaxer for your neck pain, most likely muscle strains.  Follow up with primary care provider or this office if symptoms persist or worsen.
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
## 2021-07-05 IMAGING — DX PORTABLE CHEST - 1 VIEW
1 series · 1 of 1 positions shown · non-contrast
Comparison: 03/29/2019

CLINICAL DATA: Status post intubation

EXAM:
PORTABLE CHEST 1 VIEW

[chest]
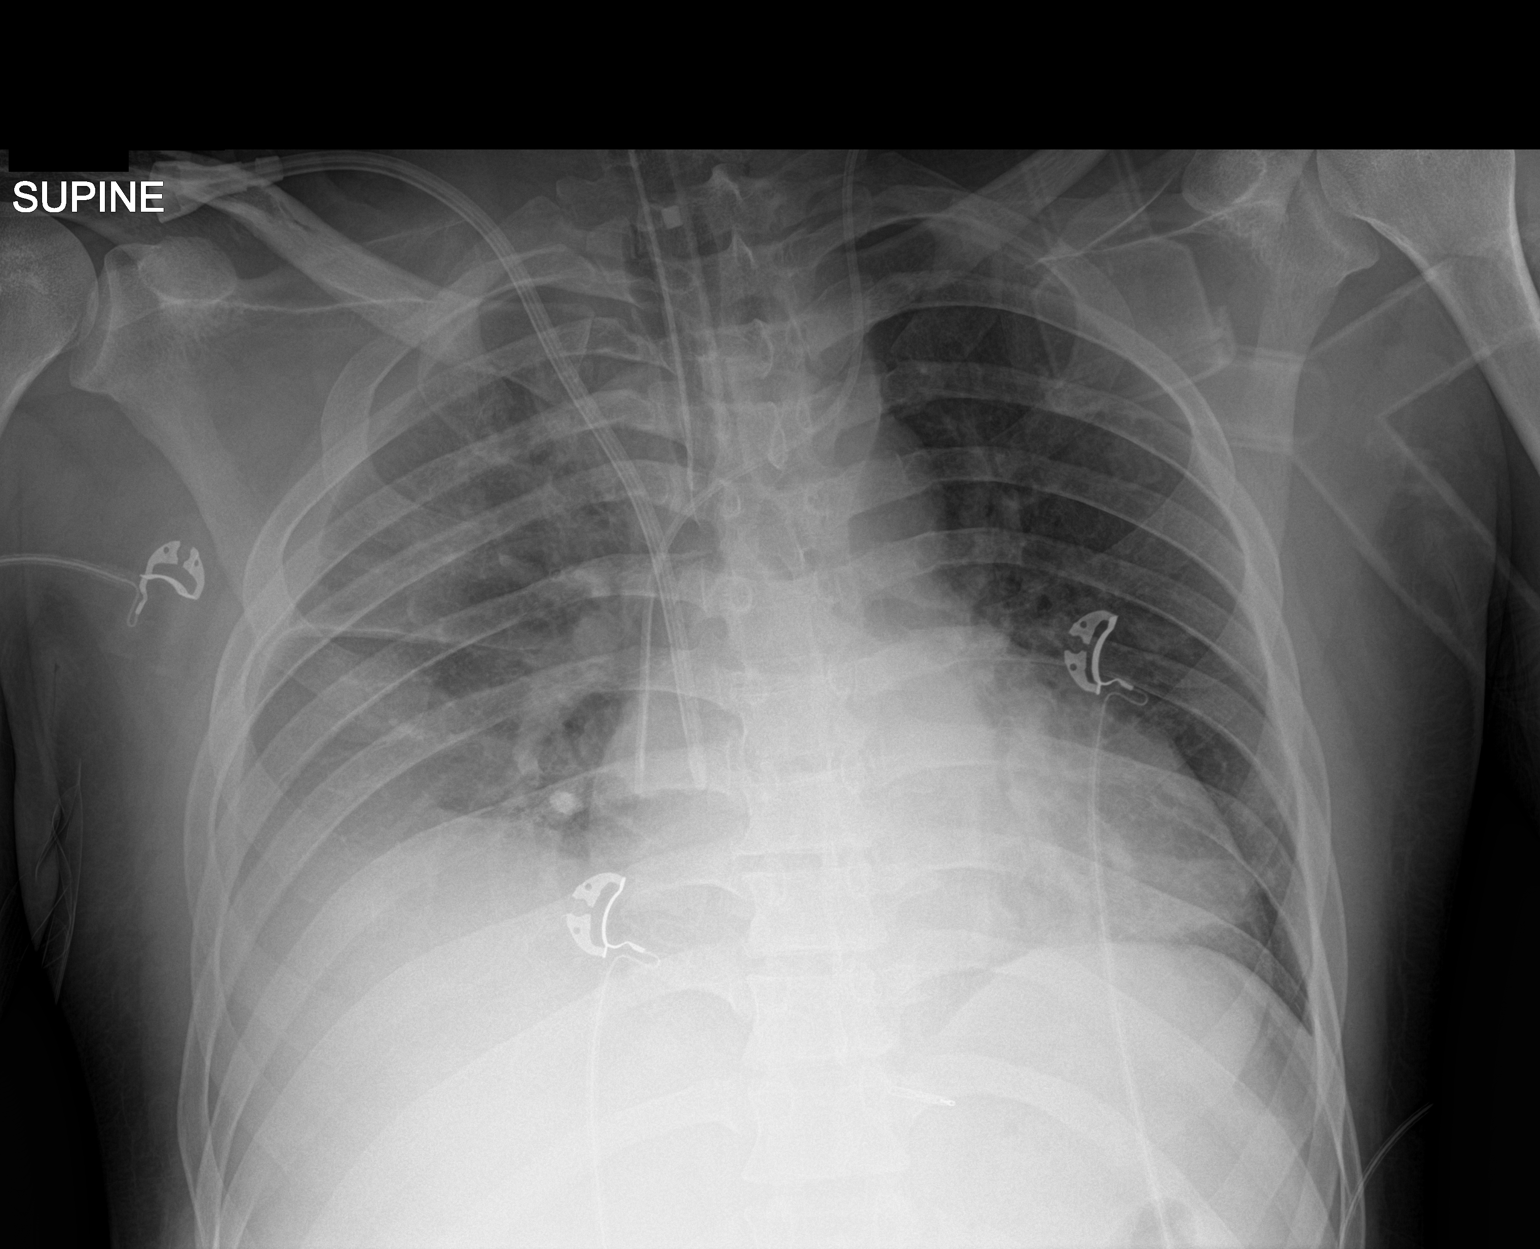

[1 of 1 positions shown; findings below may reference images not displayed]

FINDINGS: Endotracheal tube is now seen 17 mm above the carina. Left and right
jugular central lines are noted just below the cavoatrial junction.
No pneumothorax is seen. Right-sided pleural effusion is noted and
stable. Cardiomegaly is again seen.
IMPRESSION: Endotracheal tube in place just above the carina.

Bilateral jugular central lines are noted.

Stable right pleural effusion.

## 2021-07-06 IMAGING — DX PORTABLE ABDOMEN - 1 VIEW
1 series · 1 of 1 positions shown · non-contrast
Comparison: None.

CLINICAL DATA: NG tube placement.

EXAM:
PORTABLE ABDOMEN - 1 VIEW

[abdomen kub]
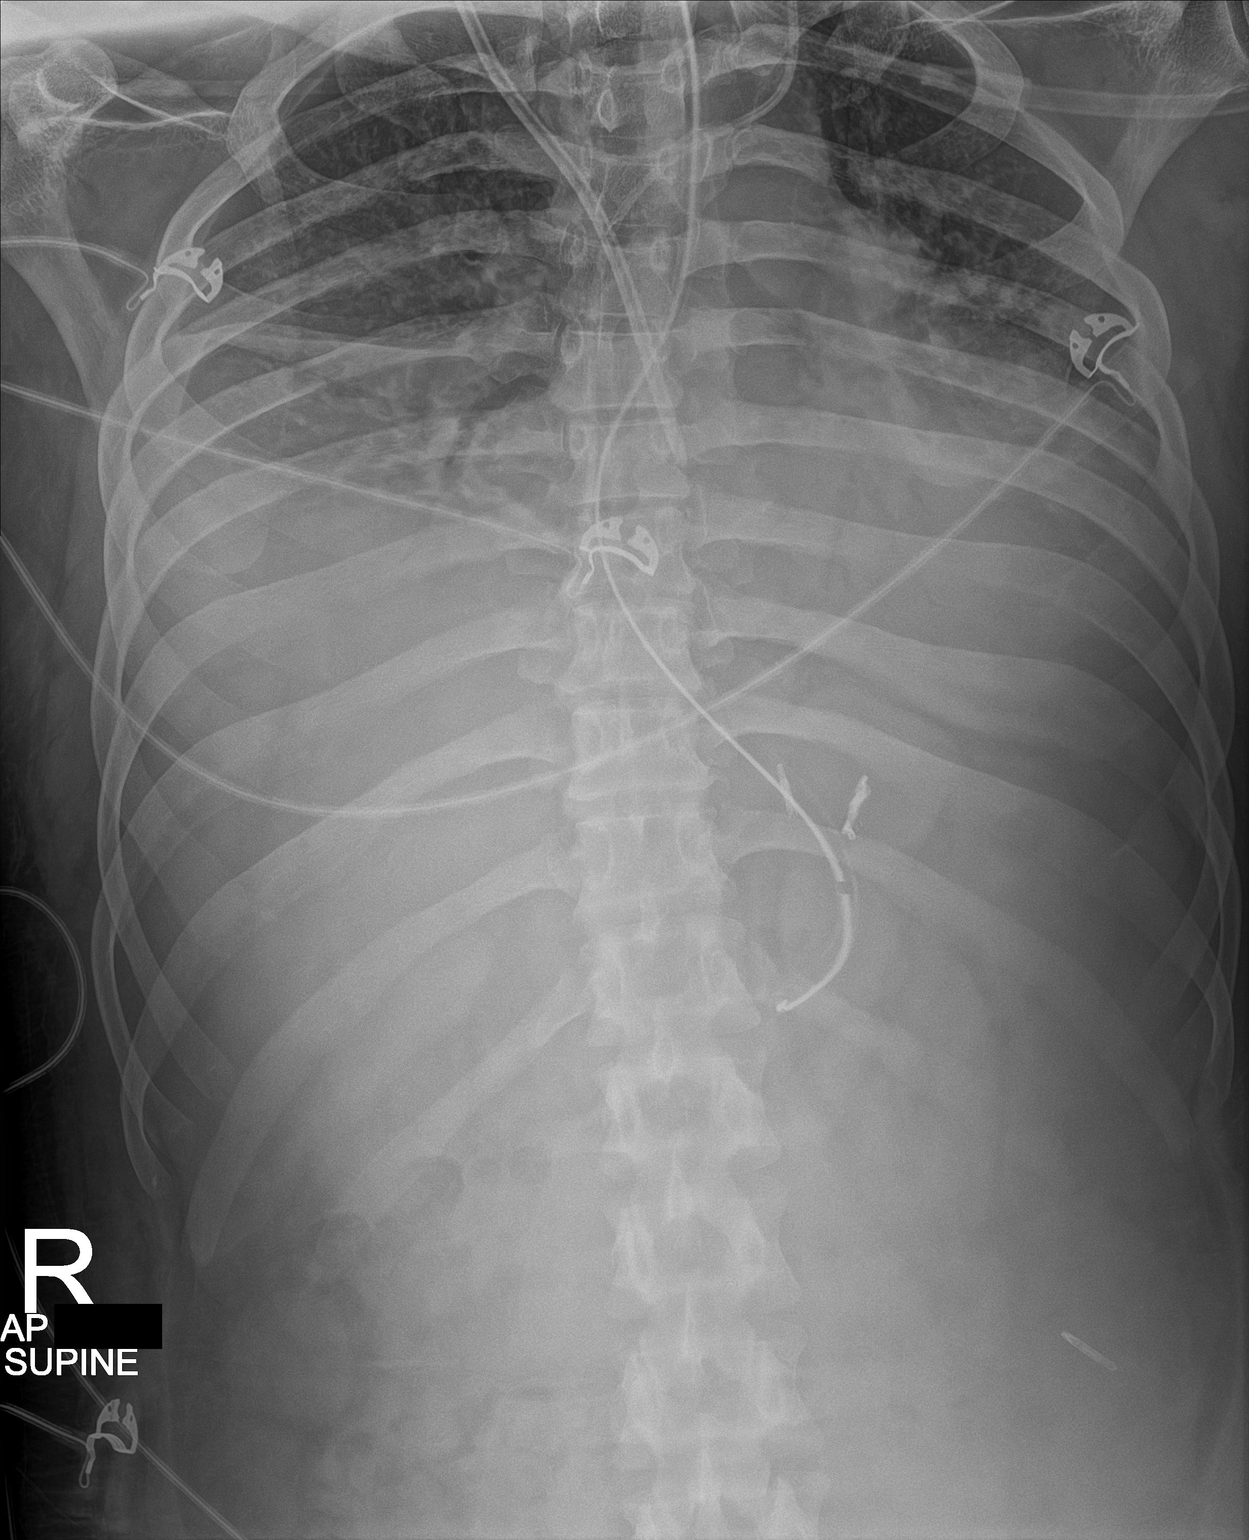

[1 of 1 positions shown; findings below may reference images not displayed]

FINDINGS: NG tube tip is in the region of the mid stomach. Surgical clips in
the left upper quadrant. Consolidation in the left lower lobe. Small
bilateral pleural effusions. Two central lines in place, 1 at the
level of the carina and the other at the level of the cavoatrial
junction.

Minimal air in nondistended bowel.
IMPRESSION: NG tube tip in the mid stomach.

Progressive consolidation at the left lung base.  Effusions.

## 2021-07-13 IMAGING — DX PORTABLE CHEST - 1 VIEW
1 series · 1 of 1 positions shown · non-contrast
Comparison: 04/04/2019, 03/29/2019, 11/20/2017

CLINICAL DATA: Hypoxia

EXAM:
PORTABLE CHEST 1 VIEW

[chest ap]
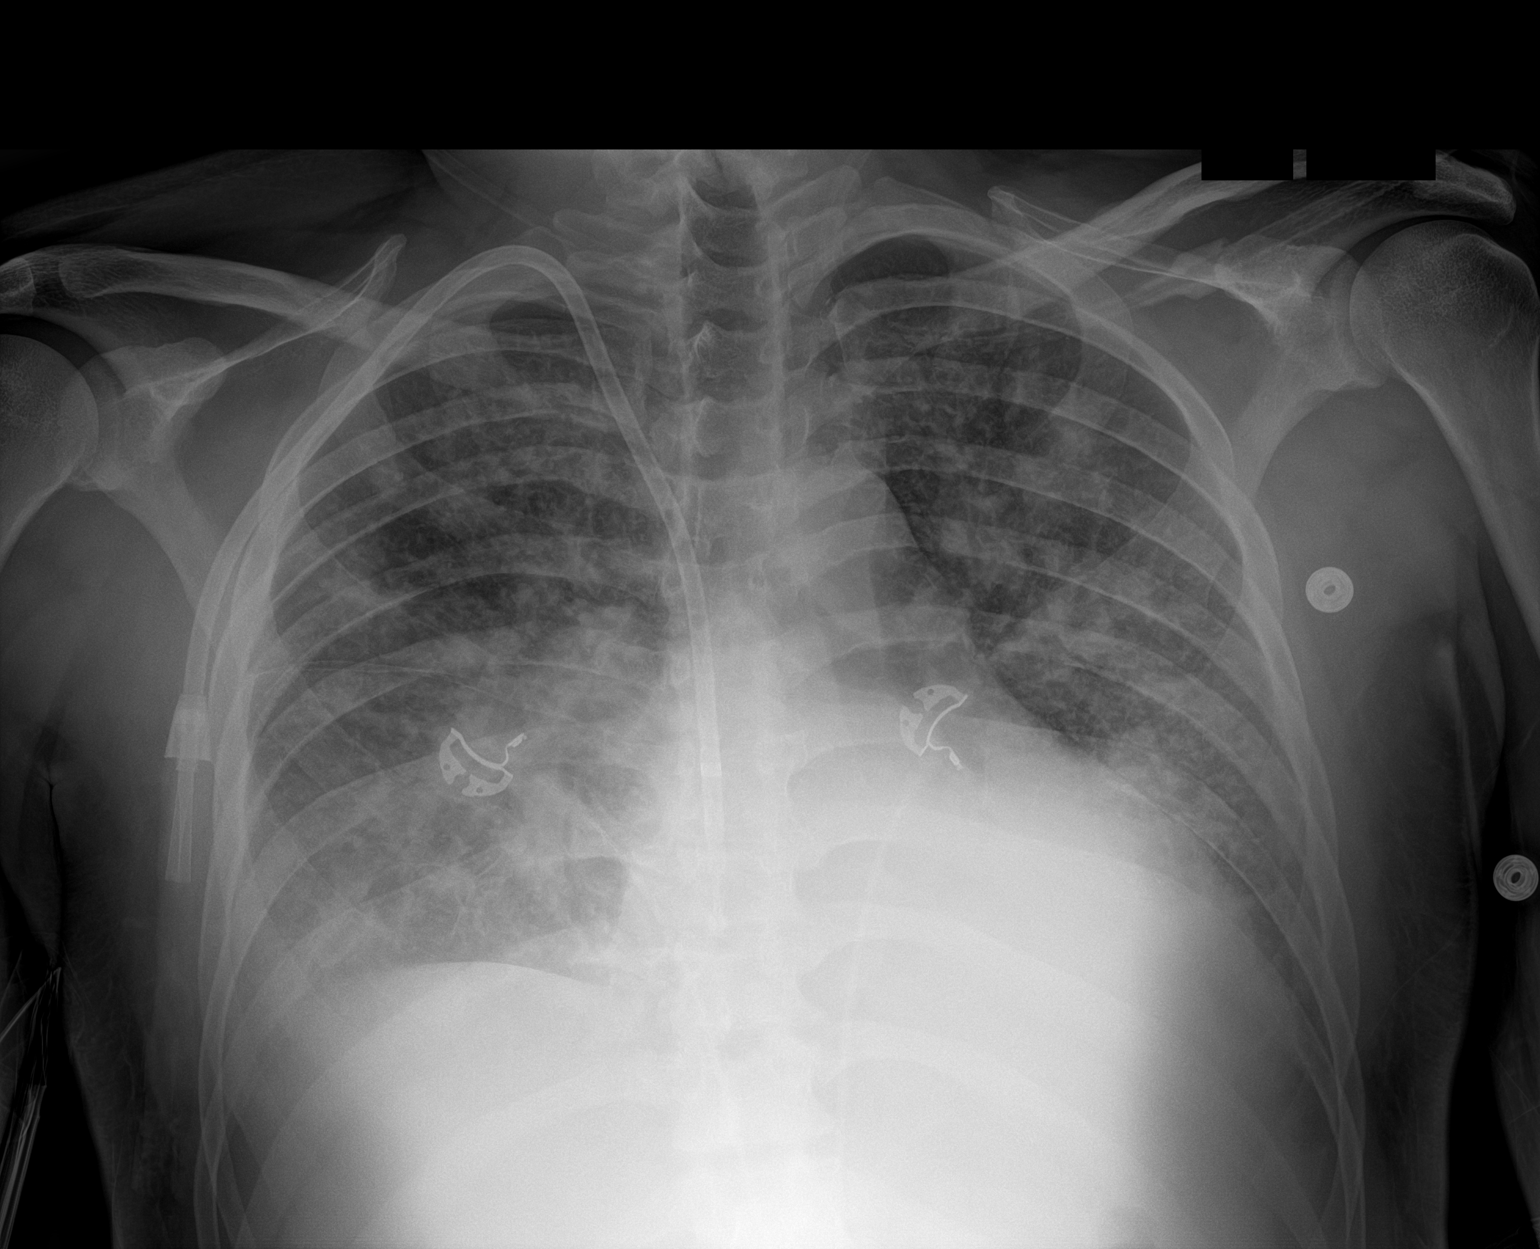

[1 of 1 positions shown; findings below may reference images not displayed]

FINDINGS: Right-sided venous catheter tip over the right atrium. Cardiomegaly.
Vascular congestion. Extensive bilateral interstitial and alveolar
airspace disease with worsening since prior radiograph. Dense
consolidation at the left base. Probable small pleural effusions.
IMPRESSION: 1. Cardiomegaly with vascular congestion and extensive interstitial
and alveolar opacity which may be secondary to pulmonary edema or
possibly diffuse infection. Overall the findings appear worse
compared to the prior radiograph
2. Dense left lung base atelectasis or pneumonia. Probable small
pleural effusions.

## 2021-11-27 ENCOUNTER — Other Ambulatory Visit: Payer: Self-pay | Admitting: Nephrology

## 2021-11-27 ENCOUNTER — Ambulatory Visit
Admission: RE | Admit: 2021-11-27 | Discharge: 2021-11-27 | Disposition: A | Discharge status: Home / Self Care | Payer: Medicare Other | Source: Ambulatory Visit | Attending: Nephrology | Admitting: Nephrology

## 2021-11-27 DIAGNOSIS — R059 Cough, unspecified: Secondary | ICD-10-CM

## 2021-11-27 DIAGNOSIS — R042 Hemoptysis: Secondary | ICD-10-CM

## 2023-02-18 ENCOUNTER — Other Ambulatory Visit: Payer: Self-pay | Admitting: Nephrology

## 2023-02-18 DIAGNOSIS — M79661 Pain in right lower leg: Secondary | ICD-10-CM

## 2023-02-20 ENCOUNTER — Ambulatory Visit
Admission: RE | Admit: 2023-02-20 | Discharge: 2023-02-20 | Disposition: A | Discharge status: Home / Self Care | Payer: Medicare Other | Source: Ambulatory Visit | Attending: Nephrology | Admitting: Nephrology

## 2023-02-20 ENCOUNTER — Other Ambulatory Visit: Payer: Medicare Other

## 2023-02-20 DIAGNOSIS — M79662 Pain in left lower leg: Secondary | ICD-10-CM

## 2023-03-27 ENCOUNTER — Ambulatory Visit (INDEPENDENT_AMBULATORY_CARE_PROVIDER_SITE_OTHER): Payer: Medicare Other | Admitting: Podiatry

## 2023-03-27 DIAGNOSIS — L97321 Non-pressure chronic ulcer of left ankle limited to breakdown of skin: Secondary | ICD-10-CM

## 2023-03-27 MED ORDER — MUPIROCIN 2 % EX OINT
1.0000 | TOPICAL_OINTMENT | Freq: Two times a day (BID) | CUTANEOUS | 0 refills | Status: DC
Start: 2023-03-27 — End: 2023-04-02

## 2023-03-27 NOTE — Progress Notes (Signed)
  Subjective:  Patient ID: [name], male    DOB: [birth year],  MRN: [id number]  Chief Complaint  Patient presents with   Cellulitis    Cellulitis with open wound left foot/ankle. Patient is taking Clindamycin 3 times a day since 03/16/2023. Patient is currently doing dialysis.     32 y.o. male presents with concern for wound that appeared approximately 1 week ago on his left ankle. He states it started when he was scratching the area. He went to urgent care and the Rx mupirocin ointment and has been on clindamycin, states that since then it has improved with decreased swelling redness and pain. Also draining less.   Past Medical History:  Diagnosis Date   Dyspnea    with exertion   ESRD (end stage renal disease) ([facility])    TTHSAT   Hypertension 03/29/2019    No Known Allergies  ROS: Negative except as per HPI above  Objective:  General: AAO x3, NAD  Dermatological: Ulceration present to the lateral aspect of the left  ankle.  Superficial in nature, no drainage. Eschar present. Appears to be healing. No erythema surrounding.    Vascular:  Dorsalis Pedis artery and Posterior Tibial artery pedal pulses are 2/4 bilateral.  Capillary fill time < 3 sec to all digits.   Neruologic: Grossly intact via light touch bilateral. Protective threshold intact to all sites bilateral.   Musculoskeletal: No gross boney pedal deformities bilateral. No pain, crepitus, or limitation noted with foot and ankle range of motion bilateral. Muscular strength 5/5 in all groups tested bilateral.  Gait: Unassisted, Nonantalgic.   No images are attached to the encounter.   Assessment:   1. Skin ulcer of left ankle, limited to breakdown of skin [facility])      Plan:  Patient was evaluated and treated and all questions answered.  #Superificial ulceration of left lateral ankle, concern of prior infection -Overall wound appears to be improving with wound care and abx - No debridment indicated -  Recommend continued mupirocin ointment 2% to the area daily - Finish clindamycin and then no further abx indicated - Ace wrap for edema control - Cover with band aid until fully healed  Return in about 3 weeks (around 04/17/2023) for f/u L ankle ulcer.          Corinna Gab, DPM [facility] / [facility]

## 2023-04-02 ENCOUNTER — Other Ambulatory Visit: Payer: Self-pay | Admitting: Podiatry

## 2023-04-02 ENCOUNTER — Telehealth: Payer: Self-pay | Admitting: Podiatry

## 2023-04-02 ENCOUNTER — Other Ambulatory Visit (INDEPENDENT_AMBULATORY_CARE_PROVIDER_SITE_OTHER): Payer: Medicare Other | Admitting: Podiatry

## 2023-04-02 MED ORDER — MUPIROCIN 2 % EX OINT: 1.0000 | TOPICAL_OINTMENT | Freq: Two times a day (BID) | CUTANEOUS | 0 refills | Status: DC

## 2023-04-02 MED ORDER — CLINDAMYCIN HCL 300 MG PO CAPS: 300.0000 mg | ORAL_CAPSULE | Freq: Three times a day (TID) | ORAL | 0 refills | Status: AC

## 2023-04-02 NOTE — Progress Notes (Signed)
Rx for mupirocin sent

## 2023-04-02 NOTE — Telephone Encounter (Signed)
Pt wants refill on antibiotic and a cream

## 2023-04-02 NOTE — Telephone Encounter (Signed)
Pt called requesting a refill on Rx Clindamycin 300 mg for infection cut for his ankle.   Please advise

## 2023-04-07 ENCOUNTER — Telehealth: Payer: Self-pay | Admitting: Podiatry

## 2023-04-07 ENCOUNTER — Other Ambulatory Visit: Payer: Self-pay | Admitting: Podiatry

## 2023-04-07 MED ORDER — MUPIROCIN 2 % EX OINT
1.0000 | TOPICAL_OINTMENT | Freq: Two times a day (BID) | CUTANEOUS | 0 refills | Status: AC
Start: 2023-04-07 — End: ?

## 2023-04-07 NOTE — Telephone Encounter (Signed)
Pt called requesting a refill for Rx Bactroban.  Please advise

## 2023-04-15 ENCOUNTER — Telehealth: Payer: Self-pay | Admitting: Podiatry

## 2023-04-15 NOTE — Telephone Encounter (Signed)
pt need the cream and pills for his foot if you could send in a refill for him, foot is hurting and burning. Pt can't get in to see you until the 16th  of aug

## 2023-04-16 ENCOUNTER — Telehealth: Payer: Self-pay | Admitting: Podiatry

## 2023-04-16 NOTE — Telephone Encounter (Signed)
Pt is requesting a refill on the Bactroban ointment & the oral antibiotics. Please advise

## 2023-04-17 MED ORDER — DOXYCYCLINE HYCLATE 100 MG PO TABS: 100.0000 mg | ORAL_TABLET | Freq: Two times a day (BID) | ORAL | 0 refills | Status: DC

## 2023-04-17 MED ORDER — MUPIROCIN CALCIUM 2 % EX CREA: 1.0000 | TOPICAL_CREAM | Freq: Two times a day (BID) | CUTANEOUS | 0 refills | Status: DC

## 2023-04-21 ENCOUNTER — Other Ambulatory Visit: Payer: Self-pay | Admitting: Podiatry

## 2023-04-21 ENCOUNTER — Telehealth: Payer: Self-pay | Admitting: Podiatry

## 2023-04-21 DIAGNOSIS — L97321 Non-pressure chronic ulcer of left ankle limited to breakdown of skin: Secondary | ICD-10-CM

## 2023-04-21 NOTE — Progress Notes (Signed)
Referral to wound care °

## 2023-04-21 NOTE — Telephone Encounter (Signed)
PA called wound on his ankle they are concerd about calciphylaxis they want to request a biopsy. PA phone number is [phone] [facility]. This pt will be seeing you on this friday

## 2023-04-23 ENCOUNTER — Other Ambulatory Visit (INDEPENDENT_AMBULATORY_CARE_PROVIDER_SITE_OTHER): Payer: Medicare Other | Admitting: Podiatry

## 2023-04-23 ENCOUNTER — Telehealth: Payer: Self-pay | Admitting: Podiatry

## 2023-04-23 MED ORDER — MUPIROCIN CALCIUM 2 % EX CREA: 1.0000 | TOPICAL_CREAM | Freq: Two times a day (BID) | CUTANEOUS | 0 refills | Status: DC

## 2023-04-23 NOTE — Telephone Encounter (Signed)
pt need refill of mupirocin cream

## 2023-04-23 NOTE — Progress Notes (Signed)
Refill of mupirocin sent

## 2023-04-24 ENCOUNTER — Ambulatory Visit (INDEPENDENT_AMBULATORY_CARE_PROVIDER_SITE_OTHER): Payer: Medicare Other | Admitting: Podiatry

## 2023-04-24 DIAGNOSIS — L97322 Non-pressure chronic ulcer of left ankle with fat layer exposed: Secondary | ICD-10-CM

## 2023-04-24 DIAGNOSIS — N186 End stage renal disease: Secondary | ICD-10-CM

## 2023-04-24 DIAGNOSIS — Z992 Dependence on renal dialysis: Secondary | ICD-10-CM

## 2023-04-24 MED ORDER — OXYCODONE-ACETAMINOPHEN 5-325 MG PO TABS: 1.0000 | ORAL_TABLET | ORAL | 0 refills | Status: AC | PRN

## 2023-04-24 MED ORDER — SILVER SULFADIAZINE 1 % EX CREA: 1.0000 | TOPICAL_CREAM | Freq: Every day | CUTANEOUS | 0 refills | Status: DC

## 2023-04-24 NOTE — Progress Notes (Signed)
  Subjective:  Patient ID: Yama Mafi, male    DOB: 1991/01/30,  MRN: [id number]  Chief Complaint  Patient presents with   Wound Check    States leg is getting worse.  Room 20.    32 y.o. male presents for follow up of left leg ulcer.  Apical is getting worse at this time.  He was seen by a nephrologist or PA at [facility] who was concerned about calciphylaxis and they wanted to request a biopsy.  He has severe pain with this ulcer.  He has been applying mupirocin and applying a Band-Aid.  Was referred to wound care center but hasn't gone yet. He is on ESRD on HD.  Past Medical History:  Diagnosis Date   Dyspnea    with exertion   ESRD (end stage renal disease) (HCC)    TTHSAT   Hypertension 03/29/2019    No Known Allergies  ROS: Negative except as per HPI above  Objective:  General: AAO x3, NAD  Dermatological: Ulceration present to the lateral aspect of the left  ankle.  Now deep to subcutaneous fat level.  There is purplish discoloration around the wound.  Worse than prior significantly. Severe pain with palpation.    Vascular:  Dorsalis Pedis artery and Posterior Tibial artery pedal pulses are 2/4 bilateral.  Capillary fill time < 3 sec to all digits.   Neruologic: Grossly intact via light touch bilateral. Protective threshold intact to all sites bilateral.   Musculoskeletal: No gross boney pedal deformities bilateral. No pain, crepitus, or limitation noted with foot and ankle range of motion bilateral. Muscular strength 5/5 in all groups tested bilateral.  Gait: Unassisted, Nonantalgic.   No images are attached to the encounter.   Assessment:   1. Skin ulcer of left ankle with fat layer exposed (HCC)   2. Calciphylaxis   3. ESRD (end stage renal disease) on dialysis [facility])      Plan:  Patient was evaluated and treated and all questions answered.  #Superificial ulceration of left lateral ankle, concern for possible calciphylaxis -Discussed with patient  he does have possible calciphylaxis of the left lower extremity -Recommend Silvadene ointment at this time and prescribed this for the patient. -Typically I do not treat calciphylaxis therefore recommend referral to the wound care center for further evaluation and treatment have previously placed a referral for this. -Patient may require biopsy of the wounds -Prescribed E Rx for Percocet 5-325 mg every 4-8 hours as needed for pain -Also placed referral to plastic surgery to see if there is anything they can do however I believe the treatment will primarily be wound care and medical management          Corinna Gab, DPM [facility] / [facility]

## 2023-04-29 NOTE — Telephone Encounter (Signed)
Reminder letter has been mailed to the patient

## 2023-04-30 ENCOUNTER — Telehealth: Payer: Self-pay | Admitting: Podiatry

## 2023-04-30 NOTE — Telephone Encounter (Signed)
Hi this pt has 3 medications that he needs refills on . The Antibiotic, the cream and pain medication

## 2023-05-01 ENCOUNTER — Other Ambulatory Visit: Payer: Self-pay | Admitting: Podiatry

## 2023-05-01 MED ORDER — DOXYCYCLINE HYCLATE 100 MG PO TABS
100.0000 mg | ORAL_TABLET | Freq: Two times a day (BID) | ORAL | 0 refills | Status: AC
Start: 2023-05-01 — End: ?

## 2023-05-01 MED ORDER — MUPIROCIN CALCIUM 2 % EX CREA: 1.0000 | TOPICAL_CREAM | Freq: Two times a day (BID) | CUTANEOUS | 0 refills | Status: AC

## 2023-05-01 MED ORDER — OXYCODONE-ACETAMINOPHEN 5-325 MG PO TABS: 1.0000 | ORAL_TABLET | Freq: Four times a day (QID) | ORAL | 0 refills | Status: DC | PRN

## 2023-05-01 NOTE — Progress Notes (Signed)
Refill of meds sent

## 2023-05-04 ENCOUNTER — Other Ambulatory Visit: Payer: Self-pay | Admitting: Podiatry

## 2023-05-04 ENCOUNTER — Telehealth: Payer: Self-pay

## 2023-05-04 MED ORDER — SILVER SULFADIAZINE 1 % EX CREA: 1.0000 | TOPICAL_CREAM | Freq: Every day | CUTANEOUS | 0 refills | Status: AC

## 2023-05-04 NOTE — Telephone Encounter (Signed)
Patient requesting a refill on SILVADENE. Pharmacy is [facility] [place], [place]

## 2023-05-04 NOTE — Progress Notes (Signed)
Silvadene refill sent

## 2023-05-14 ENCOUNTER — Other Ambulatory Visit: Payer: Self-pay | Admitting: Podiatry

## 2023-05-14 ENCOUNTER — Telehealth: Payer: Self-pay | Admitting: Podiatry

## 2023-05-14 MED ORDER — SILVER SULFADIAZINE 1 % EX CREA: 1.0000 | TOPICAL_CREAM | Freq: Every day | CUTANEOUS | 0 refills | Status: DC

## 2023-05-14 MED ORDER — OXYCODONE-ACETAMINOPHEN 5-325 MG PO TABS: 1.0000 | ORAL_TABLET | Freq: Four times a day (QID) | ORAL | 0 refills | Status: DC | PRN

## 2023-05-14 NOTE — Telephone Encounter (Signed)
called about refills on medication

## 2023-05-14 NOTE — Telephone Encounter (Signed)
pt needs a refill on the silver sulfaiazie cream and pain medication and medication for the skin infection

## 2023-05-15 ENCOUNTER — Telehealth: Payer: Self-pay | Admitting: Podiatry

## 2023-05-15 NOTE — Telephone Encounter (Signed)
Patient called stated his left leg is getting numb from his pinky toe

## 2023-05-20 ENCOUNTER — Ambulatory Visit (INDEPENDENT_AMBULATORY_CARE_PROVIDER_SITE_OTHER): Payer: Medicare Other | Admitting: Plastic Surgery

## 2023-05-20 ENCOUNTER — Encounter: Payer: Self-pay | Admitting: Plastic Surgery

## 2023-05-20 VITALS — BP 170/97 | Ht 71.0 in | Wt 184.2 lb

## 2023-05-20 DIAGNOSIS — S91002A Unspecified open wound, left ankle, initial encounter: Secondary | ICD-10-CM | POA: Diagnosis not present

## 2023-05-20 NOTE — Progress Notes (Signed)
Referring Provider Pilar Plate, DPM [address] [place],  [place] [address]   CC:  Chief Complaint  Patient presents with   Advice Only      [name] is an 32 y.o. male.  HPI: [name] is a 32 year old male who is sent for evaluation of a left lateral malleolus wound.  The wound has been there for approximately 2 months.  He has been undergoing topical treatment during that period of time.  Patient is currently undergoing dialysis for end-stage renal disease  No Known Allergies  Outpatient Encounter Medications as of 05/20/2023  Medication Sig Note   acetaminophen (TYLENOL) 325 MG tablet Take 325-650 mg by mouth every 6 (six) hours as needed for moderate pain.    amLODipine (NORVASC) 10 MG tablet Take 1 tablet (10 mg total) by mouth daily.    carvedilol (COREG) 25 MG tablet Take 1 tablet (25 mg total) by mouth 2 (two) times daily with a meal.    clindamycin (CLEOCIN) 300 MG capsule Take 300 mg by mouth 3 (three) times daily.    cyclobenzaprine (FLEXERIL) 10 MG tablet Take 1 tablet (10 mg total) by mouth 2 (two) times daily as needed for muscle spasms.    doxycycline (VIBRA-TABS) 100 MG tablet Take 1 tablet (100 mg total) by mouth 2 (two) times daily.    ferrous sulfate 325 (65 FE) MG tablet Take 1 tablet (325 mg total) by mouth daily with breakfast.    hydrALAZINE (APRESOLINE) 100 MG tablet Take 1 tablet (100 mg total) by mouth every 8 (eight) hours.    isosorbide dinitrate (ISORDIL) 30 MG tablet Take 1 tablet (30 mg total) by mouth 3 (three) times daily. (Patient taking differently: Take 30 mg by mouth 3 (three) times daily. Patient picking up medication today- 06/06/2019) 06/06/2019: Pt ran out, needs to get more.  Pt was having issues with insurance coverage, but now has it resolved.    mupirocin cream (BACTROBAN) 2 % Apply 1 Application topically 2 (two) times daily.    mupirocin ointment (BACTROBAN) 2 % Apply topically 2 (two) times daily.     mupirocin ointment (BACTROBAN) 2 % Apply 1 Application topically 2 (two) times daily.    oxyCODONE-acetaminophen (PERCOCET/ROXICET) 5-325 MG tablet Take 1 tablet by mouth every 6 (six) hours as needed.    pantoprazole (PROTONIX) 40 MG tablet Take 1 tablet (40 mg total) by mouth 2 (two) times daily before a meal.    sertraline (ZOLOFT) 50 MG tablet 1/2 tab daily PO x 2 wks then 1 tab PO daily.  For anxiety (Patient taking differently: Take 25-50 mg by mouth See admin instructions. For anxiety Take 25 mg daily for 2 weeks then take 50 mg daily) 06/06/2019: Hasnt started   silver sulfADIAZINE (SILVADENE) 1 % cream Apply 1 Application topically daily.    silver sulfADIAZINE (SILVADENE) 1 % cream Apply 1 Application topically daily.    No facility-administered encounter medications on file as of 05/20/2023.     Past Medical History:  Diagnosis Date   Dyspnea    with exertion   ESRD (end stage renal disease) (HCC)    TTHSAT   Hypertension 03/29/2019    Past Surgical History:  Procedure Laterality Date   AV FISTULA PLACEMENT Left 04/04/2019   Procedure: Creation of First Stage Basilic Vein Arteriovenous Fistula;  Surgeon: Larina Earthly, MD;  Location: [facility] OR;  Service: Vascular;  Laterality: Left;   BASCILIC VEIN TRANSPOSITION Left 06/08/2019   Procedure:  SECOND STAGE BASILIC VEIN TRANSPOSITION LEFT ARM;  Surgeon: Larina Earthly, MD;  Location: [facility] OR;  Service: Vascular;  Laterality: Left;   ESOPHAGOGASTRODUODENOSCOPY (EGD) WITH PROPOFOL N/A 03/29/2019   Procedure: ESOPHAGOGASTRODUODENOSCOPY (EGD) WITH PROPOFOL;  Surgeon: Napoleon Form, MD;  Location: [facility] ENDOSCOPY;  Service: Endoscopy;  Laterality: N/A;   HEMOSTASIS CLIP PLACEMENT  03/29/2019   Procedure: HEMOSTASIS CLIP PLACEMENT;  Surgeon: Napoleon Form, MD;  Location: [facility] ENDOSCOPY;  Service: Endoscopy;;   INSERTION OF DIALYSIS CATHETER Right 04/04/2019   Procedure: INSERTION OF DIALYSIS CATHETER;  Surgeon: Larina Earthly, MD;   Location: [facility] OR;  Service: Vascular;  Laterality: Right;    Family History  Problem Relation Age of Onset   Hypertension Mother    Heart attack Mother    Diabetes Mother    Kidney failure Brother     Social History   Social History Narrative   Not on file     Review of Systems General: Denies fevers, chills, weight loss CV: Denies chest pain, shortness of breath, palpitations Skin: Wound on the lateral aspect of the left ankle.  Patient states that it is extremely painful.  Physical Exam    05/20/2023    2:53 PM 11/09/2019    3:30 PM 06/08/2019    1:58 PM  Vitals with BMI  Height 5\' 11"     Weight 184 lbs 3 oz    BMI 25.7    Systolic 170 161 440  Diastolic 97 85 79  Pulse  77 91    General:  No acute distress,  Alert and oriented, Non-Toxic, Normal speech and affect Integument: wound on the lateral aspect of the left ankle.  The foot is otherwise warm and appears to be well-perfused   Assessment/Plan Open wound, left lateral ankle: I agree with the previous diagnosis that this is most likely calciphylaxis.  I do not believe that surgical intervention would be advisable.  Wound care at this point is primarily for comfort.  Definitive treatment unfortunately is with renal transplantation and normalization of his metabolic status. Discussed this with him.  He may follow-up at any time for further evaluation.  Santiago Glad 05/20/2023, 5:21 PM

## 2023-05-21 ENCOUNTER — Encounter (HOSPITAL_BASED_OUTPATIENT_CLINIC_OR_DEPARTMENT_OTHER): Payer: Medicare Other | Attending: General Surgery | Admitting: General Surgery

## 2023-05-21 ENCOUNTER — Other Ambulatory Visit (HOSPITAL_BASED_OUTPATIENT_CLINIC_OR_DEPARTMENT_OTHER): Payer: Self-pay | Admitting: General Surgery

## 2023-05-21 DIAGNOSIS — I120 Hypertensive chronic kidney disease with stage 5 chronic kidney disease or end stage renal disease: Secondary | ICD-10-CM | POA: Insufficient documentation

## 2023-05-21 DIAGNOSIS — L97322 Non-pressure chronic ulcer of left ankle with fat layer exposed: Secondary | ICD-10-CM | POA: Diagnosis present

## 2023-05-21 DIAGNOSIS — Z833 Family history of diabetes mellitus: Secondary | ICD-10-CM | POA: Insufficient documentation

## 2023-05-21 DIAGNOSIS — N186 End stage renal disease: Secondary | ICD-10-CM | POA: Insufficient documentation

## 2023-05-21 DIAGNOSIS — Z992 Dependence on renal dialysis: Secondary | ICD-10-CM | POA: Insufficient documentation

## 2023-05-22 ENCOUNTER — Telehealth: Payer: Self-pay | Admitting: Podiatry

## 2023-05-22 ENCOUNTER — Other Ambulatory Visit: Payer: Self-pay | Admitting: Podiatry

## 2023-05-22 MED ORDER — SILVER SULFADIAZINE 1 % EX CREA: 1.0000 | TOPICAL_CREAM | Freq: Every day | CUTANEOUS | 0 refills | Status: AC

## 2023-05-22 MED ORDER — OXYCODONE-ACETAMINOPHEN 5-325 MG PO TABS: 1.0000 | ORAL_TABLET | Freq: Four times a day (QID) | ORAL | 0 refills | Status: DC | PRN

## 2023-05-22 NOTE — Telephone Encounter (Signed)
Pt needs a refill on his cream and his oxycodone. He would like a bigger dosage of both

## 2023-05-25 NOTE — Progress Notes (Signed)
water repellant cover (for example, large plastic bag) or a cast cover and may then take shower. Wound Treatment Wound #1 - Lower Leg Wound Laterality: Left, Lateral Cleanser: Soap and Water 1 x Per Day/30 Days Discharge Instructions: May shower and wash wound with dial antibacterial soap and water prior to dressing change. Cleanser: Vashe 5.8 (oz) 1 x Per Day/30 Days Discharge Instructions: Cleanse the wound with Vashe prior to applying a clean dressing using gauze sponges, not tissue or cotton balls. Peri-Wound Care: Silver Sulfadiazine Cream 1%, 25 (g), tube (Generic) 1 x Per Day/30 Days Prim Dressing: Hydrofera Blue Ready Transfer Foam, 4x5 (in/in) (Generic) 1 x Per Day/30 Days ary Discharge Instructions: Apply to wound bed as instructed Secondary Dressing: Zetuvit Plus Silicone Border Heel Dressing10x10 (in/in) (Generic) 1 x Per Day/30 Days Discharge Instructions: Apply silicone border over primary dressing as directed. Wound #2 - Ankle Wound  Laterality: Left, Medial Cleanser: Soap and Water 1 x Per Day/30 Days Discharge Instructions: May shower and wash wound with dial antibacterial soap and water prior to dressing change. Cleanser: Vashe 5.8 (oz) (Generic) 1 x Per Day/30 Days Discharge Instructions: Cleanse the wound with Vashe prior to applying a clean dressing using gauze sponges, not tissue or cotton balls. Peri-Wound Care: Silver Sulfadiazine Cream 1%, 25 (g), tube (Generic) 1 x Per Day/30 Days Prim Dressing: Hydrofera Blue Ready Transfer Foam, 4x5 (in/in) (DME) (Generic) 1 x Per Day/30 Days ary Discharge Instructions: Apply to wound bed as instructed Secondary Dressing: Zetuvit Plus Silicone Border Heel Dressing10x10 (in/in) (DME) (Generic) 1 x Per Day/30 Days Discharge Instructions: Apply silicone border over primary dressing as directed. Consults General Surgery - Refer to Dr. Gwyndolyn Kaufman at [facility] for evaluation for potential parathyroidectomy. 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) Electronic Signature(s) Signed: 05/21/2023 12:00:58 PM By: [name] Signed: 05/25/2023 12:35:20 PM By: Brenton Grills Entered By: Brenton Grills on 05/21/2023 08:59:34 Prescription 05/21/2023 -------------------------------------------------------------------------------- Alona Bene MD Patient Name: Provider: 30-Apr-1991 [id number] Date of Birth: NPI#: [id number] Sex: DEA #: [phone] Phone #: License #: [employer] SignsJani Gravel ([id number]) [id number] Page 5 of 10 Patient Address: [address] Eligha Bridegroom [facility] [place] [address] , [address] [place], [place] [address] [phone] Allergies No Known Allergies Provider's Orders General Surgery - ICD10: Y40.347 - Refer to Dr. Gwyndolyn Kaufman at [facility] for evaluation for potential  parathyroidectomy. Hand Signature: Date(s): Electronic Signature(s) Signed: 05/21/2023 12:00:58 PM By: [name] Signed: 05/25/2023 12:35:20 PM By: Brenton Grills Previous Signature: 05/21/2023 11:30:32 AM Version By: [name] Entered By: Brenton Grills on 05/21/2023 08:59:34 -------------------------------------------------------------------------------- Problem List Details Patient Name: Date of Service: CA RTA [name] 05/21/2023 8:00 A M Medical Record Number: [id number] Patient Account Number: [id number] Date of Birth/Sex: Treating RN: 1991/04/15 (32 y.o. 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 He apparently was given clindamycin and mupirocin by urgent care prior to his visit with Dr. Annamary Rummage. At that time, the wound was felt [place], [name] ([id number]) [id number] Page 3 of 10 to be improving on the prescribed regimen. About 3 weeks later, the wound was worsening. 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 The patient has presented to the emergency department in [facility] several times due to increased pain. He was apparently referred to plastic surgery from the ED. He saw Dr. 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 According to the dialysis rounding note from Tuesday of this week, sodium thiosulfate is being empirically initiated. I still do not see that a biopsy has yet been performed. He is not currently taking Sensipar, as far as I can see. His intact PTH is actually not particularly  high with his last value being 173 and his calcium phosphorus product only 55, although his phosphorus has only recently started to come under control with previous values in the 7-8 range. 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 Electronic Signature(s) Signed: 05/21/2023 11:20:28 AM By: [name] Previous Signature: 05/21/2023 8:32:37 AM Version By: [name] Entered By: Duanne Guess on 05/21/2023 08:20:27 -------------------------------------------------------------------------------- Physical Exam Details Patient Name: Date of Service: CA RTA [name] 05/21/2023 8:00 A M Medical Record Number: [id number] Patient Account Number: [id number] Date of Birth/Sex: Treating RN: [birth year] (32 y.o. M) Primary Care Provider: [name], [name] Other Clinician: Referring Provider: Treating Provider/Extender: [name] in Treatment: 0 Constitutional Hypertensive, asymptomatic. . . . No acute distress. Respiratory Normal work of breathing on room air. Cardiovascular . Notes 05/21/2023: On his left lateral ankle, there is an irregular punched-out ulcer with violaceous edges and extensive slough on the surfaces. It is exquisitely tender to even light touch. On his medial left ankle, there are 2 areas of eschar and another site that appears to be threatening to break open. Electronic Signature(s) Signed: 05/21/2023 11:22:17 AM By: [name] Entered By: Duanne Guess on 05/21/2023 08:22:17 -------------------------------------------------------------------------------- Physician Orders Details Patient Name: Date of Service: CA RTA [name] 05/21/2023 8:00 A M Medical Record Number: [id number] Patient Account Number: [id number] Date of Birth/Sex: Treating RN: [birth year] (32 y.o. 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 - Dr Lady Gary - Please make appt Return A Anesthetic Wound #1 Left,Lateral Lower Leg (In clinic) Topical Lidocaine 4% applied to wound bed Wound #2 Left,Medial Ankle (In clinic) Topical Lidocaine 4% applied to wound bed Bathing/ Shower/ Hygiene May shower with protection but do not get wound dressing(s) wet. Protect dressing(s) with  QUADREE, ASBERRY ([id number]) [id number] Page 1 of 10 Visit Report for 05/21/2023 Biopsy Details Patient Name: Date of Service: CA RTA Ardean Larsen 05/21/2023 8:00 A M Medical Record Number: [id number] Patient Account Number: [id number] Date of Birth/Sex: Treating RN: 05-06-1991 (32 y.o. M) Primary Care Provider: [name], [name] Other Clinician: Referring Provider: Treating Provider/Extender: [name] in Treatment: 0 Biopsy Performed for: Wound #1 Left, Lateral Lower Leg The following information was scribed by: Brenton Grills The information was scribed for: Duanne Guess Location(s): Peri-Ulcer Tissue Performed By: Physician Duanne Guess, MD Tissue Punch: Yes Size (mm): 5 Number of Specimens T aken: 1 Specimen Sent T Pathology: o Yes Level of Consciousness (Pre-procedure): Awake and Alert Pre-procedure Verification/Time-Out Taken: Yes - 09:20 Pain Control: Lidocaine Injectable Lidocaine Percent: 1% Instrument: Forceps, Scissors Bleeding: Minimum Hemostasis Achieved: Silver Nitrate Procedural Pain: 0 Post Procedural Pain: 0 Response to Treatment: Procedure was tolerated well Level of Consciousness (Post-procedure): Awake and Alert Post Procedure Diagnosis Same as Pre-procedure Electronic Signature(s) Signed: 05/21/2023 11:18:55 AM By: [name] Entered By: Duanne Guess on 05/21/2023 08:18:54 -------------------------------------------------------------------------------- Chief Complaint Document Details Patient Name: Date of Service: CA RTA [name] 05/21/2023 8:00 A M Medical Record Number: [id number] Patient Account Number: [id number] Date of Birth/Sex: Treating RN: [birth year] (32 y.o. M) Primary Care Provider: [name], [name] Other Clinician: Referring Provider: Treating Provider/Extender: [name] in Treatment: 0 Information  Obtained from: Patient Chief Complaint Patient seen for complaints of Non-Healing Wound due to calciphylaxis Electronic Signature(s) Signed: 05/21/2023 11:20:05 AM By: [name] Previous Signature: 05/21/2023 8:26:03 AM Version By: [name] Entered By: Duanne Guess on 05/21/2023 08:20:05 [name] [id number] Page 2 of 10 -------------------------------------------------------------------------------- Debridement Details Patient Name: Date of Service: CA RTA Ardean Larsen 05/21/2023 8:00 A M Medical Record Number: [id number] Patient Account Number: [id number] Date of Birth/Sex: Treating RN: Jan 21, 1991 (32 y.o. M) Primary Care Provider: [name], [name] Other Clinician: Referring Provider: Treating Provider/Extender: [name] in Treatment: 0 Debridement Performed for Assessment: Wound #1 Left,Lateral Lower Leg Performed By: Physician Duanne Guess, MD The following information was scribed by: Brenton Grills The information was scribed for: Duanne Guess Debridement Type: Debridement Level of Consciousness (Pre-procedure): Awake and Alert Pre-procedure Verification/Time Out Yes - 09:24 Taken: Start Time: 09:25 Pain Control: Lidocaine 4% T opical Solution Percent of Wound Bed Debrided: 100% T Area Debrided (cm): otal 41.21 Tissue and other material debrided: Non-Viable, Slough, Slough Level: Non-Viable Tissue Debridement Description: Selective/Open Wound Instrument: Curette Bleeding: None Response to Treatment: Procedure was tolerated well Level of Consciousness (Post- Awake and Alert procedure): Post Debridement Measurements of Total Wound Length: (cm) 7.5 Width: (cm) 7 Depth: (cm) 0.3 Volume: (cm) 12.37 Character of Wound/Ulcer Post Debridement: Requires Further Debridement Post Procedure Diagnosis Same as Pre-procedure Electronic  Signature(s) Signed: 05/21/2023 11:19:39 AM By: [name] Entered By: Duanne Guess on 05/21/2023 08:19:38 -------------------------------------------------------------------------------- HPI Details Patient Name: Date of Service: CA RTA [name] 05/21/2023 8:00 A M Medical Record Number: [id number] Patient Account Number: [id number] Date of Birth/Sex: Treating RN: 04-13-1991 (32 y.o. M) Primary Care Provider: [name], [name] Other Clinician: Referring Provider: Treating Provider/Extender: [name] in Treatment: 0 History of Present Illness HPI Description: ADMISSION 05/21/2023 This is a 32 year old man with end-stage renal disease on hemodialysis. In July of this year, he was  water repellant cover (for example, large plastic bag) or a cast cover and may then take shower. Wound Treatment Wound #1 - Lower Leg Wound Laterality: Left, Lateral Cleanser: Soap and Water 1 x Per Day/30 Days Discharge Instructions: May shower and wash wound with dial antibacterial soap and water prior to dressing change. Cleanser: Vashe 5.8 (oz) 1 x Per Day/30 Days Discharge Instructions: Cleanse the wound with Vashe prior to applying a clean dressing using gauze sponges, not tissue or cotton balls. Peri-Wound Care: Silver Sulfadiazine Cream 1%, 25 (g), tube (Generic) 1 x Per Day/30 Days Prim Dressing: Hydrofera Blue Ready Transfer Foam, 4x5 (in/in) (Generic) 1 x Per Day/30 Days ary Discharge Instructions: Apply to wound bed as instructed Secondary Dressing: Zetuvit Plus Silicone Border Heel Dressing10x10 (in/in) (Generic) 1 x Per Day/30 Days Discharge Instructions: Apply silicone border over primary dressing as directed. Wound #2 - Ankle Wound  Laterality: Left, Medial Cleanser: Soap and Water 1 x Per Day/30 Days Discharge Instructions: May shower and wash wound with dial antibacterial soap and water prior to dressing change. Cleanser: Vashe 5.8 (oz) (Generic) 1 x Per Day/30 Days Discharge Instructions: Cleanse the wound with Vashe prior to applying a clean dressing using gauze sponges, not tissue or cotton balls. Peri-Wound Care: Silver Sulfadiazine Cream 1%, 25 (g), tube (Generic) 1 x Per Day/30 Days Prim Dressing: Hydrofera Blue Ready Transfer Foam, 4x5 (in/in) (DME) (Generic) 1 x Per Day/30 Days ary Discharge Instructions: Apply to wound bed as instructed Secondary Dressing: Zetuvit Plus Silicone Border Heel Dressing10x10 (in/in) (DME) (Generic) 1 x Per Day/30 Days Discharge Instructions: Apply silicone border over primary dressing as directed. Consults General Surgery - Refer to Dr. Gwyndolyn Kaufman at [facility] for evaluation for potential parathyroidectomy. 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) Electronic Signature(s) Signed: 05/21/2023 12:00:58 PM By: [name] Signed: 05/25/2023 12:35:20 PM By: Brenton Grills Entered By: Brenton Grills on 05/21/2023 08:59:34 Prescription 05/21/2023 -------------------------------------------------------------------------------- Alona Bene MD Patient Name: Provider: 30-Apr-1991 [id number] Date of Birth: NPI#: [id number] Sex: DEA #: [phone] Phone #: License #: [employer] SignsJani Gravel ([id number]) [id number] Page 5 of 10 Patient Address: [address] [place] Bridegroom [facility] [place] [address] , [address] [place], [place] [address] [phone] Allergies No Known Allergies Provider's Orders General Surgery - ICD10: Y40.347 - Refer to Dr. Gwyndolyn Kaufman at [facility] for evaluation for potential  parathyroidectomy. Hand Signature: Date(s): Electronic Signature(s) Signed: 05/21/2023 12:00:58 PM By: [name] Signed: 05/25/2023 12:35:20 PM By: Brenton Grills Previous Signature: 05/21/2023 11:30:32 AM Version By: [name] Entered By: Brenton Grills on 05/21/2023 08:59:34 -------------------------------------------------------------------------------- Problem List Details Patient Name: Date of Service: CA RTA [name] 05/21/2023 8:00 A M Medical Record Number: [id number] Patient Account Number: [id number] Date of Birth/Sex: Treating RN: 1991/04/15 (32 y.o. 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 05/21/2023 No Yes N18.6 End stage renal disease 05/21/2023 No Yes Z99.2 Dependence on renal dialysis 05/21/2023 No Yes Inactive Problems Resolved Problems Electronic Signature(s) Signed: 05/21/2023 11:14:20 AM By: [name] Previous Signature: 05/21/2023 8:24:49 AM Version By: Lady Gary  QUADREE, ASBERRY ([id number]) [id number] Page 1 of 10 Visit Report for 05/21/2023 Biopsy Details Patient Name: Date of Service: CA RTA Ardean Larsen 05/21/2023 8:00 A M Medical Record Number: [id number] Patient Account Number: [id number] Date of Birth/Sex: Treating RN: 05-06-1991 (32 y.o. M) Primary Care Provider: [name], [name] Other Clinician: Referring Provider: Treating Provider/Extender: [name] in Treatment: 0 Biopsy Performed for: Wound #1 Left, Lateral Lower Leg The following information was scribed by: Brenton Grills The information was scribed for: Duanne Guess Location(s): Peri-Ulcer Tissue Performed By: Physician Duanne Guess, MD Tissue Punch: Yes Size (mm): 5 Number of Specimens T aken: 1 Specimen Sent T Pathology: o Yes Level of Consciousness (Pre-procedure): Awake and Alert Pre-procedure Verification/Time-Out Taken: Yes - 09:20 Pain Control: Lidocaine Injectable Lidocaine Percent: 1% Instrument: Forceps, Scissors Bleeding: Minimum Hemostasis Achieved: Silver Nitrate Procedural Pain: 0 Post Procedural Pain: 0 Response to Treatment: Procedure was tolerated well Level of Consciousness (Post-procedure): Awake and Alert Post Procedure Diagnosis Same as Pre-procedure Electronic Signature(s) Signed: 05/21/2023 11:18:55 AM By: [name] Entered By: Duanne Guess on 05/21/2023 08:18:54 -------------------------------------------------------------------------------- Chief Complaint Document Details Patient Name: Date of Service: CA RTA [name] 05/21/2023 8:00 A M Medical Record Number: [id number] Patient Account Number: [id number] Date of Birth/Sex: Treating RN: [birth year] (32 y.o. M) Primary Care Provider: [name], [name] Other Clinician: Referring Provider: Treating Provider/Extender: [name] in Treatment: 0 Information  Obtained from: Patient Chief Complaint Patient seen for complaints of Non-Healing Wound due to calciphylaxis Electronic Signature(s) Signed: 05/21/2023 11:20:05 AM By: [name] Previous Signature: 05/21/2023 8:26:03 AM Version By: [name] Entered By: Duanne Guess on 05/21/2023 08:20:05 [name] [id number] Page 2 of 10 -------------------------------------------------------------------------------- Debridement Details Patient Name: Date of Service: CA RTA Ardean Larsen 05/21/2023 8:00 A M Medical Record Number: [id number] Patient Account Number: [id number] Date of Birth/Sex: Treating RN: Jan 21, 1991 (32 y.o. M) Primary Care Provider: [name], [name] Other Clinician: Referring Provider: Treating Provider/Extender: [name] in Treatment: 0 Debridement Performed for Assessment: Wound #1 Left,Lateral Lower Leg Performed By: Physician Duanne Guess, MD The following information was scribed by: Brenton Grills The information was scribed for: Duanne Guess Debridement Type: Debridement Level of Consciousness (Pre-procedure): Awake and Alert Pre-procedure Verification/Time Out Yes - 09:24 Taken: Start Time: 09:25 Pain Control: Lidocaine 4% T opical Solution Percent of Wound Bed Debrided: 100% T Area Debrided (cm): otal 41.21 Tissue and other material debrided: Non-Viable, Slough, Slough Level: Non-Viable Tissue Debridement Description: Selective/Open Wound Instrument: Curette Bleeding: None Response to Treatment: Procedure was tolerated well Level of Consciousness (Post- Awake and Alert procedure): Post Debridement Measurements of Total Wound Length: (cm) 7.5 Width: (cm) 7 Depth: (cm) 0.3 Volume: (cm) 12.37 Character of Wound/Ulcer Post Debridement: Requires Further Debridement Post Procedure Diagnosis Same as Pre-procedure Electronic  Signature(s) Signed: 05/21/2023 11:19:39 AM By: [name] Entered By: Duanne Guess on 05/21/2023 08:19:38 -------------------------------------------------------------------------------- HPI Details Patient Name: Date of Service: CA RTA [name] 05/21/2023 8:00 A M Medical Record Number: [id number] Patient Account Number: [id number] Date of Birth/Sex: Treating RN: 04-13-1991 (32 y.o. M) Primary Care Provider: [name], [name] Other Clinician: Referring Provider: Treating Provider/Extender: [name] in Treatment: 0 History of Present Illness HPI Description: ADMISSION 05/21/2023 This is a 32 year old man with end-stage renal disease on hemodialysis. In July of this year, he was  Name: Date of Service: CA RTA Ardean Larsen 05/21/2023 8:00 A M Medical Record Number: [id number] Patient Account Number: [id number] Date of Birth/Sex: Treating RN: [birth year] (31 y.o. [name] Primary Care Provider: [name], PRO [name] Other Clinician: Referring Provider: Treating Provider/Extender: [name] in Treatment: 0 Information Obtained From Chart Integumentary (Skin) Complaints and Symptoms: Positive for: Wounds - left ankle x 1 month Cardiovascular Medical  History: Positive for: Hypertension Genitourinary Medical History: Positive for: End Stage Renal Disease Past Medical History Notes: GI bleed, ulcer Psychiatric Medical History: Past Medical History Notes: anxiety, depression [place], [name] ([id number]) [id number] Page 9 of 10 Immunizations Pneumococcal Vaccine: Received Pneumococcal Vaccination: No Implantable Devices No devices added Hospitalization / Surgery History Type of Hospitalization/Surgery av fistula placement EGD Family and Social History Diabetes: Yes - Mother; Heart Disease: Yes - Mother; Hypertension: Yes - Mother; Kidney Disease: Yes - Siblings; Former smoker - ended on 03/09/2019; Alcohol Use: Never; Drug Use: No History; Caffeine Use: Never; Financial Concerns: No; Food, Clothing or Shelter Needs: No; Support System Lacking: No; Transportation Concerns: No Electronic Signature(s) Signed: 05/21/2023 4:31:02 PM By: [name] Signed: 05/25/2023 12:35:20 PM By: Brenton Grills Previous Signature: 05/21/2023 12:00:58 PM Version By: [name] Entered By: Brenton Grills on 05/21/2023 09:40:17 -------------------------------------------------------------------------------- SuperBill Details Patient Name: Date of Service: CA RTA Ardean Larsen 05/21/2023 Medical Record Number: [id number] Patient Account Number: [id number] Date of Birth/Sex: Treating RN: August 11, 1991 (32 y.o. 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 N18.6 End stage renal disease Z99.2 Dependence on renal dialysis Facility Procedures : CPT4 Code: [id number] Description: 97597 - DEBRIDE WOUND 1ST 20 SQ CM  OR < ICD-10 Diagnosis Description L97.322 Non-pressure chronic ulcer of left ankle with fat layer exposed Modifier: Quantity: 1 : CPT4 Code: [id number] Description: 97598 - DEBRIDE WOUND EA ADDL 20 SQ CM ICD-10 Diagnosis Description L97.322 Non-pressure chronic ulcer of left ankle with fat layer exposed Modifier: Quantity: 2 : CPT4 Code: [id number] Description: 11104-Punch biopsy of skin (including simple closure, when performed) single lesion ICD-10 Diagnosis Description L97.322 Non-pressure chronic ulcer of left ankle with fat layer exposed Modifier: Quantity: 1 Physician Procedures : CPT4 Code Description Modifier [id number] - WC PHYS LEVEL 4 - EST PT 25 ICD-10 Diagnosis Description 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 Ranes, Epimenio ([id number])  [id number] End stage renal disease Z99.2 Dependence on renal dialysis Quantity: 1 [id number] Page 10 of 10 : [id number] - WC PHYS DEBR WO ANESTH 20 SQ CM ICD-10 Diagnosis Description L97.322 Non-pressure chronic ulcer of left ankle with fat layer exposed Quantity: 1 : [id number] - WC PHYS DEBR WO ANESTH EA ADD 20 CM ICD-10 Diagnosis Description L97.322 Non-pressure chronic ulcer of left ankle with fat layer exposed Quantity: 2 : 11104 Punch biopsy of skin (including simple closure, when performed) single lesion ICD-10 Diagnosis Description L97.322 Non-pressure chronic ulcer of left ankle with fat layer exposed Quantity: 1 Electronic Signature(s) Signed: 05/21/2023 11:30:11 AM By: [name] Entered By: Duanne Guess on 05/21/2023 08:30:10  water repellant cover (for example, large plastic bag) or a cast cover and may then take shower. Wound Treatment Wound #1 - Lower Leg Wound Laterality: Left, Lateral Cleanser: Soap and Water 1 x Per Day/30 Days Discharge Instructions: May shower and wash wound with dial antibacterial soap and water prior to dressing change. Cleanser: Vashe 5.8 (oz) 1 x Per Day/30 Days Discharge Instructions: Cleanse the wound with Vashe prior to applying a clean dressing using gauze sponges, not tissue or cotton balls. Peri-Wound Care: Silver Sulfadiazine Cream 1%, 25 (g), tube (Generic) 1 x Per Day/30 Days Prim Dressing: Hydrofera Blue Ready Transfer Foam, 4x5 (in/in) (Generic) 1 x Per Day/30 Days ary Discharge Instructions: Apply to wound bed as instructed Secondary Dressing: Zetuvit Plus Silicone Border Heel Dressing10x10 (in/in) (Generic) 1 x Per Day/30 Days Discharge Instructions: Apply silicone border over primary dressing as directed. Wound #2 - Ankle Wound  Laterality: Left, Medial Cleanser: Soap and Water 1 x Per Day/30 Days Discharge Instructions: May shower and wash wound with dial antibacterial soap and water prior to dressing change. Cleanser: Vashe 5.8 (oz) (Generic) 1 x Per Day/30 Days Discharge Instructions: Cleanse the wound with Vashe prior to applying a clean dressing using gauze sponges, not tissue or cotton balls. Peri-Wound Care: Silver Sulfadiazine Cream 1%, 25 (g), tube (Generic) 1 x Per Day/30 Days Prim Dressing: Hydrofera Blue Ready Transfer Foam, 4x5 (in/in) (DME) (Generic) 1 x Per Day/30 Days ary Discharge Instructions: Apply to wound bed as instructed Secondary Dressing: Zetuvit Plus Silicone Border Heel Dressing10x10 (in/in) (DME) (Generic) 1 x Per Day/30 Days Discharge Instructions: Apply silicone border over primary dressing as directed. Consults General Surgery - Refer to Dr. Gwyndolyn Kaufman at [facility] for evaluation for potential parathyroidectomy. 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) Electronic Signature(s) Signed: 05/21/2023 12:00:58 PM By: [name] Signed: 05/25/2023 12:35:20 PM By: Brenton Grills Entered By: Brenton Grills on 05/21/2023 08:59:34 Prescription 05/21/2023 -------------------------------------------------------------------------------- Alona Bene MD Patient Name: Provider: 30-Apr-1991 [id number] Date of Birth: NPI#: [id number] Sex: DEA #: [phone] Phone #: License #: [employer] ([id number]) [id number] Page 5 of 10 Patient Address: [address] Eligha Bridegroom [facility] [place] [address] , [address] [place], [place] [address] [phone] Allergies No Known Allergies Provider's Orders General Surgery - ICD10: Y40.347 - Refer to Dr. Gwyndolyn Kaufman at [facility] for evaluation for potential  parathyroidectomy. Hand Signature: Date(s): Electronic Signature(s) Signed: 05/21/2023 12:00:58 PM By: [name] Signed: 05/25/2023 12:35:20 PM By: Brenton Grills Previous Signature: 05/21/2023 11:30:32 AM Version By: [name] Entered By: Brenton Grills on 05/21/2023 08:59:34 -------------------------------------------------------------------------------- Problem List Details Patient Name: Date of Service: CA RTA [name] 05/21/2023 8:00 A M Medical Record Number: [id number] Patient Account Number: [id number] Date of Birth/Sex: Treating RN: 1991/04/15 (32 y.o. 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

## 2023-05-25 NOTE — Progress Notes (Signed)
billed separately Has the patient been seen at the hospital within the last three years: Yes Total Score: 115 Level Of Care: New/Established - Level 3 Electronic Signature(s) Signed: 05/25/2023 12:35:20 PM By: [name] Entered By: [name] on 05/21/2023 05:41:08 [name] ([id number]) [id number] Page 3 of 10 -------------------------------------------------------------------------------- Encounter Discharge Information Details Patient Name: Date of Service: [name] 05/21/2023 8:00 A M Medical Record Number: [id number] Patient Account Number: [id number] Date of Birth/Sex: Treating RN: 1991-08-11 (32 y.o. [name] Primary Care [name]: [name] [name] Other Clinician: Referring [name]: Treating [name]/Extender: [name] in Treatment: 0 Encounter Discharge Information Items Post Procedure Vitals Discharge Condition: Stable Temperature (F): 98.3 Ambulatory Status: Ambulatory Pulse (bpm): 84 Discharge Destination: Home Respiratory Rate (breaths/min): 18 Transportation: Private Auto Blood Pressure (mmHg): 180/100 Accompanied By: self Schedule Follow-up Appointment: Yes Clinical Summary of Care: Patient Declined Electronic Signature(s) Signed: 05/25/2023 12:35:20 PM By: [name] Entered By: [name] on 05/21/2023 06:43:06 -------------------------------------------------------------------------------- Lower Extremity Assessment Details Patient Name: Date of Service: [name] 05/21/2023 8:00 A M Medical Record Number: [id number] Patient Account Number:  [id number] Date of Birth/Sex: Treating RN: 12/23/1990 (32 y.o. [name] Primary Care [name]: [name] ULT, [name] Pineda Other Clinician: Referring [name]: Treating [name]/Extender: [name] in Treatment: 0 Edema Assessment Assessed: [Left: No] [Right: No] [Left: Edema] [Right: :] Calf Left: Right: Point of Measurement: From Medial Instep 38.8 cm Ankle Left: Right: Point of Measurement: From Medial Instep 25.4 cm Vascular Assessment Pulses: Dorsalis Pedis Palpable: [Left:Yes] Extremity colors, hair growth, and conditions: Hair Growth on Extremity: [Left:Yes] Temperature of Extremity: [Left:Warm] Dependent Rubor: [Left:No] Blanched when Elevated: [Left:No] Lipodermatosclerosis: [Left:No] Blood Pressure: Brachial: [Left:167] Ankle: [Left:Dorsalis Pedis: 172 1.03] Toe Nail Assessment Left: Right: Thick: Yes Discolored: Yes DeformedRIGBY, Pineda ([id number]) [id number] Page 4 of 10 Improper Length and Hygiene: Yes Electronic Signature(s) Signed: 05/25/2023 12:35:20 PM By: [name] Entered By: [name] on 05/21/2023 05:55:08 -------------------------------------------------------------------------------- Multi Wound Chart Details Patient Name: Date of Service: [name] 05/21/2023 8:00 A M Medical Record Number: [id number] Patient Account Number: [id number] Date of Birth/Sex: Treating RN: [birth year] (32 y.o. M) Primary Care [name]: [name] Other Clinician: Referring [name]: Treating [name]/Extender: [name] in Treatment: 0 Vital Signs Height(in): 71 Pulse(bpm): 76 Weight(lbs): 184 Blood Pressure(mmHg): 167/100 Body Mass Index(BMI): 25.7 Temperature(F): 98.6 Respiratory Rate(breaths/min): 18 [1:Photos:] [Pineda/A:Pineda/A] Left, Lateral Lower Leg Left, Medial Ankle Pineda/A Wound Location: Bite Bite Pineda/A Wounding Event: T  be determined o T be determined o Pineda/A Primary Etiology: Hypertension, End Stage Renal Hypertension, End Stage Renal Pineda/A Comorbid History: Disease Disease 03/09/2023 03/09/2023 Pineda/A Date Acquired: 0 0 Pineda/A Weeks of Treatment: Open Open Pineda/A Wound Status: No No Pineda/A Wound Recurrence: 7.5x7x0.2 2x0.5x0.1 Pineda/A Measurements L x W x D (cm) 41.233 0.785 Pineda/A A (cm) : rea 8.247 0.079 Pineda/A Volume (cm) : Full Thickness Without Exposed Full Thickness Without Exposed Pineda/A Classification: Support Structures Support Structures Medium Medium Pineda/A Exudate A mount: Serosanguineous Serosanguineous Pineda/A Exudate Type: red, brown red, brown Pineda/A Exudate Color: Distinct, outline attached Distinct, outline attached Pineda/A Wound Margin: Medium (34-66%) Medium (34-66%) Pineda/A Granulation A mount: Red, Pink Red, Pink Pineda/A Granulation Quality: Medium (34-66%) Medium (34-66%) Pineda/A Necrotic A mount: Adherent Slough Eschar, Adherent Slough Pineda/A Necrotic Tissue: Fat Layer (Subcutaneous Tissue): Yes Fat Layer (Subcutaneous Tissue): Yes Pineda/A Exposed Structures: Debridement -  billed separately Has the patient been seen at the hospital within the last three years: Yes Total Score: 115 Level Of Care: New/Established - Level 3 Electronic Signature(s) Signed: 05/25/2023 12:35:20 PM By: [name] Entered By: [name] on 05/21/2023 05:41:08 [name] ([id number]) [id number] Page 3 of 10 -------------------------------------------------------------------------------- Encounter Discharge Information Details Patient Name: Date of Service: [name] 05/21/2023 8:00 A M Medical Record Number: [id number] Patient Account Number: [id number] Date of Birth/Sex: Treating RN: 1991-08-11 (32 y.o. [name] Primary Care [name]: [name] [name] Other Clinician: Referring [name]: Treating [name]/Extender: [name] in Treatment: 0 Encounter Discharge Information Items Post Procedure Vitals Discharge Condition: Stable Temperature (F): 98.3 Ambulatory Status: Ambulatory Pulse (bpm): 84 Discharge Destination: Home Respiratory Rate (breaths/min): 18 Transportation: Private Auto Blood Pressure (mmHg): 180/100 Accompanied By: self Schedule Follow-up Appointment: Yes Clinical Summary of Care: Patient Declined Electronic Signature(s) Signed: 05/25/2023 12:35:20 PM By: [name] Entered By: [name] on 05/21/2023 06:43:06 -------------------------------------------------------------------------------- Lower Extremity Assessment Details Patient Name: Date of Service: [name] 05/21/2023 8:00 A M Medical Record Number: [id number] Patient Account Number:  [id number] Date of Birth/Sex: Treating RN: 12/23/1990 (32 y.o. [name] Primary Care [name]: [name] ULT, [name] Pineda Other Clinician: Referring [name]: Treating [name]/Extender: [name] in Treatment: 0 Edema Assessment Assessed: [Left: No] [Right: No] [Left: Edema] [Right: :] Calf Left: Right: Point of Measurement: From Medial Instep 38.8 cm Ankle Left: Right: Point of Measurement: From Medial Instep 25.4 cm Vascular Assessment Pulses: Dorsalis Pedis Palpable: [Left:Yes] Extremity colors, hair growth, and conditions: Hair Growth on Extremity: [Left:Yes] Temperature of Extremity: [Left:Warm] Dependent Rubor: [Left:No] Blanched when Elevated: [Left:No] Lipodermatosclerosis: [Left:No] Blood Pressure: Brachial: [Left:167] Ankle: [Left:Dorsalis Pedis: 172 1.03] Toe Nail Assessment Left: Right: Thick: Yes Discolored: Yes DeformedRIGBY, Pineda ([id number]) [id number] Page 4 of 10 Improper Length and Hygiene: Yes Electronic Signature(s) Signed: 05/25/2023 12:35:20 PM By: [name] Entered By: [name] on 05/21/2023 05:55:08 -------------------------------------------------------------------------------- Multi Wound Chart Details Patient Name: Date of Service: [name] 05/21/2023 8:00 A M Medical Record Number: [id number] Patient Account Number: [id number] Date of Birth/Sex: Treating RN: [birth year] (32 y.o. M) Primary Care [name]: [name] Other Clinician: Referring [name]: Treating [name]/Extender: [name] in Treatment: 0 Vital Signs Height(in): 71 Pulse(bpm): 76 Weight(lbs): 184 Blood Pressure(mmHg): 167/100 Body Mass Index(BMI): 25.7 Temperature(F): 98.6 Respiratory Rate(breaths/min): 18 [1:Photos:] [Pineda/A:Pineda/A] Left, Lateral Lower Leg Left, Medial Ankle Pineda/A Wound Location: Bite Bite Pineda/A Wounding Event: T  be determined o T be determined o Pineda/A Primary Etiology: Hypertension, End Stage Renal Hypertension, End Stage Renal Pineda/A Comorbid History: Disease Disease 03/09/2023 03/09/2023 Pineda/A Date Acquired: 0 0 Pineda/A Weeks of Treatment: Open Open Pineda/A Wound Status: No No Pineda/A Wound Recurrence: 7.5x7x0.2 2x0.5x0.1 Pineda/A Measurements L x W x D (cm) 41.233 0.785 Pineda/A A (cm) : rea 8.247 0.079 Pineda/A Volume (cm) : Full Thickness Without Exposed Full Thickness Without Exposed Pineda/A Classification: Support Structures Support Structures Medium Medium Pineda/A Exudate A mount: Serosanguineous Serosanguineous Pineda/A Exudate Type: red, brown red, brown Pineda/A Exudate Color: Distinct, outline attached Distinct, outline attached Pineda/A Wound Margin: Medium (34-66%) Medium (34-66%) Pineda/A Granulation A mount: Red, Pink Red, Pink Pineda/A Granulation Quality: Medium (34-66%) Medium (34-66%) Pineda/A Necrotic A mount: Adherent Slough Eschar, Adherent Slough Pineda/A Necrotic Tissue: Fat Layer (Subcutaneous Tissue): Yes Fat Layer (Subcutaneous Tissue): Yes Pineda/A Exposed Structures: Debridement -  of Pain Severity and Description of Pain Patient Has Paino Yes Site Locations Pain LocationESTILL, [name]  ([id number]) [id number] Page 7 of 10 Pain Location: Pain in Ulcers Rate the pain. Current Pain Level: 10 Pain Management and Medication Current Pain Management: Electronic Signature(s) Signed: 05/25/2023 12:35:20 PM By: [name] Entered By: [name] on 05/21/2023 05:47:30 -------------------------------------------------------------------------------- Patient/Caregiver Education Details Patient Name: Date of Service: [name] 9/12/2024andnbsp8:00 A M Medical Record Number: [id number] Patient Account Number: [id number] Date of Birth/Gender: Treating RN: 29-May-1991 (33 y.o. [name] Primary Care Physician: [name] ULT, [name] Pineda Other Clinician: Referring Physician: Treating Physician/Extender: [name] in Treatment: 0 Education Assessment Education Provided To: Patient Education Topics Provided Wound/Skin Impairment: Methods: Explain/Verbal Responses: State content correctly Electronic Signature(s) Signed: 05/25/2023 12:35:20 PM By: [name] Entered By: [name] on 05/21/2023 05:38:49 -------------------------------------------------------------------------------- Wound Assessment Details Patient Name: Date of Service: [name] 05/21/2023 8:00 A M Medical Record Number: [id number] Patient Account Number: [id number] Date of Birth/Sex: Treating RN: 06/08/1991 (32 y.o. [name] Primary Care Lirio Bach: [name] ULT, [name] Pineda Other Clinician: Referring [name]: Treating Tarnesha Ulloa/Extender: Lucendia Herrlich [place], [name] ([id number]) [id number] Page 8 of 10 Weeks in Treatment: 0 Wound Status Wound Number: 1 Primary Etiology: T be determined o Wound Location: Left, Lateral Lower Leg Wound Status: Open Wounding Event: Bite Comorbid History: Hypertension, End Stage Renal Disease Date Acquired: 03/09/2023 Weeks  Of Treatment: 0 Clustered Wound: No Photos Wound Measurements Length: (cm) 7.5 Width: (cm) 7 Depth: (cm) 0.2 Area: (cm) 41.233 Volume: (cm) 8.247 % Reduction in Area: % Reduction in Volume: Tunneling: No Undermining: No Wound Description Classification: Full Thickness Without Exposed Support Wound Margin: Distinct, outline attached Exudate Amount: Medium Exudate Type: Serosanguineous Exudate Color: red, brown Structures Foul Odor After Cleansing: No Slough/Fibrino Yes Wound Bed Granulation Amount: Medium (34-66%) Exposed Structure Granulation Quality: Red, Pink Fat Layer (Subcutaneous Tissue) Exposed: Yes Necrotic Amount: Medium (34-66%) Necrotic Quality: Adherent Slough Periwound Skin Texture Texture Color No Abnormalities Noted: Yes No Abnormalities Noted: No Hemosiderin Staining: Yes Moisture No Abnormalities Noted: No Dry / Scaly: No Maceration: No Electronic Signature(s) Signed: 05/21/2023 10:59:43 AM By: Dayton Scrape Signed: 05/25/2023 12:35:20 PM By: [name] Entered By: Dayton Scrape on 05/21/2023 05:33:58 -------------------------------------------------------------------------------- Wound Assessment Details Patient Name: Date of Service: [name] 05/21/2023 8:00 A M Medical Record Number: [id number] Patient Account Number: [id number] Date of Birth/Sex: Treating RN: 05/19/1991 (32 y.o. [name] Primary Care Natayla Cadenhead: [name] ULT, [name] Pineda Other Clinician: Referring [name]: Treating Hanley Woerner/Extender: [name] in Treatment: 0 [place], [name] [id number] Page 9 of 10 Wound Status Wound Number: 2 Primary Etiology: T be determined o Wound Location: Left, Medial Ankle Wound Status: Open Wounding Event: Bite Comorbid History: Hypertension, End Stage Renal Disease Date Acquired: 03/09/2023 Weeks Of Treatment: 0 Clustered Wound: No Photos Wound  Measurements Length: (cm) 2 Width: (cm) 0.5 Depth: (cm) 0.1 Area: (cm) 0.785 Volume: (cm) 0.079 % Reduction in Area: % Reduction in Volume: Tunneling: No Undermining: No Wound Description Classification: Full Thickness Without Exposed Support Wound Margin: Distinct, outline attached Exudate Amount: Medium Exudate Type: Serosanguineous Exudate Color: red, brown Structures Foul Odor After Cleansing: No Slough/Fibrino Yes Wound Bed Granulation Amount: Medium (34-66%) Exposed Structure Granulation Quality: Red, Pink Fat Layer (Subcutaneous Tissue) Exposed: Yes Necrotic Amount: Medium (34-66%) Necrotic Quality: Eschar, Adherent Slough Periwound Skin Texture Texture Color No Abnormalities  of Pain Severity and Description of Pain Patient Has Paino Yes Site Locations Pain LocationESTILL, [name]  ([id number]) [id number] Page 7 of 10 Pain Location: Pain in Ulcers Rate the pain. Current Pain Level: 10 Pain Management and Medication Current Pain Management: Electronic Signature(s) Signed: 05/25/2023 12:35:20 PM By: [name] Entered By: [name] on 05/21/2023 05:47:30 -------------------------------------------------------------------------------- Patient/Caregiver Education Details Patient Name: Date of Service: [name] 9/12/2024andnbsp8:00 A M Medical Record Number: [id number] Patient Account Number: [id number] Date of Birth/Gender: Treating RN: 29-May-1991 (33 y.o. [name] Primary Care Physician: [name] ULT, [name] Pineda Other Clinician: Referring Physician: Treating Physician/Extender: [name] in Treatment: 0 Education Assessment Education Provided To: Patient Education Topics Provided Wound/Skin Impairment: Methods: Explain/Verbal Responses: State content correctly Electronic Signature(s) Signed: 05/25/2023 12:35:20 PM By: [name] Entered By: [name] on 05/21/2023 05:38:49 -------------------------------------------------------------------------------- Wound Assessment Details Patient Name: Date of Service: [name] 05/21/2023 8:00 A M Medical Record Number: [id number] Patient Account Number: [id number] Date of Birth/Sex: Treating RN: 06/08/1991 (32 y.o. [name] Primary Care Lirio Bach: [name] ULT, [name] Pineda Other Clinician: Referring [name]: Treating Tarnesha Ulloa/Extender: Lucendia Herrlich [place], [name] ([id number]) [id number] Page 8 of 10 Weeks in Treatment: 0 Wound Status Wound Number: 1 Primary Etiology: T be determined o Wound Location: Left, Lateral Lower Leg Wound Status: Open Wounding Event: Bite Comorbid History: Hypertension, End Stage Renal Disease Date Acquired: 03/09/2023 Weeks  Of Treatment: 0 Clustered Wound: No Photos Wound Measurements Length: (cm) 7.5 Width: (cm) 7 Depth: (cm) 0.2 Area: (cm) 41.233 Volume: (cm) 8.247 % Reduction in Area: % Reduction in Volume: Tunneling: No Undermining: No Wound Description Classification: Full Thickness Without Exposed Support Wound Margin: Distinct, outline attached Exudate Amount: Medium Exudate Type: Serosanguineous Exudate Color: red, brown Structures Foul Odor After Cleansing: No Slough/Fibrino Yes Wound Bed Granulation Amount: Medium (34-66%) Exposed Structure Granulation Quality: Red, Pink Fat Layer (Subcutaneous Tissue) Exposed: Yes Necrotic Amount: Medium (34-66%) Necrotic Quality: Adherent Slough Periwound Skin Texture Texture Color No Abnormalities Noted: Yes No Abnormalities Noted: No Hemosiderin Staining: Yes Moisture No Abnormalities Noted: No Dry / Scaly: No Maceration: No Electronic Signature(s) Signed: 05/21/2023 10:59:43 AM By: Dayton Scrape Signed: 05/25/2023 12:35:20 PM By: [name] Entered By: Dayton Scrape on 05/21/2023 05:33:58 -------------------------------------------------------------------------------- Wound Assessment Details Patient Name: Date of Service: [name] 05/21/2023 8:00 A M Medical Record Number: [id number] Patient Account Number: [id number] Date of Birth/Sex: Treating RN: 05/19/1991 (32 y.o. [name] Primary Care Natayla Cadenhead: [name] ULT, [name] Pineda Other Clinician: Referring [name]: Treating Hanley Woerner/Extender: [name] in Treatment: 0 [place], [name] [id number] Page 9 of 10 Wound Status Wound Number: 2 Primary Etiology: T be determined o Wound Location: Left, Medial Ankle Wound Status: Open Wounding Event: Bite Comorbid History: Hypertension, End Stage Renal Disease Date Acquired: 03/09/2023 Weeks Of Treatment: 0 Clustered Wound: No Photos Wound  Measurements Length: (cm) 2 Width: (cm) 0.5 Depth: (cm) 0.1 Area: (cm) 0.785 Volume: (cm) 0.079 % Reduction in Area: % Reduction in Volume: Tunneling: No Undermining: No Wound Description Classification: Full Thickness Without Exposed Support Wound Margin: Distinct, outline attached Exudate Amount: Medium Exudate Type: Serosanguineous Exudate Color: red, brown Structures Foul Odor After Cleansing: No Slough/Fibrino Yes Wound Bed Granulation Amount: Medium (34-66%) Exposed Structure Granulation Quality: Red, Pink Fat Layer (Subcutaneous Tissue) Exposed: Yes Necrotic Amount: Medium (34-66%) Necrotic Quality: Eschar, Adherent Slough Periwound Skin Texture Texture Color No Abnormalities  Noted: Yes No Abnormalities Noted: Yes Moisture Temperature / Pain No Abnormalities Noted: No Temperature: No Abnormality Tenderness on Palpation: Yes Electronic Signature(s) Signed: 05/25/2023 12:35:20 PM By: [name] Entered By: [name] on 05/21/2023 05:47:10 -------------------------------------------------------------------------------- Vitals Details Patient Name: Date of Service: [name] 05/21/2023 8:00 A M Medical Record Number: [id number] Patient Account Number: [id number] Date of Birth/Sex: Treating RN: 1990-12-03 (32 y.o. [name] Primary Care Dima Mini: [name] ULT, [name] Pineda Other Clinician: Referring Nayab Aten: Treating Jamina Macbeth/Extender: [name] in Treatment: 0 Vital Signs [place], [name] ([id number]) [id number] Page 10 of 10 Time Taken: 08:00 Temperature (F): 98.6 Height (in): 71 Pulse (bpm): 76 Weight (lbs): 184 Respiratory Rate (breaths/min): 18 Body Mass Index (BMI): 25.7 Blood Pressure (mmHg): 167/100 Reference Range: 80 - 120 mg / dl Electronic Signature(s) Signed: 05/25/2023 12:35:20 PM By: [name] Entered By: [name] on 05/21/2023 05:54:36

## 2023-05-25 NOTE — Progress Notes (Signed)
BENN, KLEMANN ([id number]) [id number] Page 1 of 4 Visit Report for 05/21/2023 Abuse Risk Screen Details Patient Name: Date of Service: [name] 05/21/2023 8:00 A M Medical Record Number: [id number] Patient Account Number: [id number] Date of Birth/Sex: Treating RN: 02/13/1991 (32 y.o. [name] Primary Care [name]: [name] ULT, PRO [name] Other Clinician: Referring [name]: Treating [name]/Extender: [name] in Treatment: 0 Abuse Risk Screen Items Answer ABUSE RISK SCREEN: Has anyone close to you tried to hurt or harm you recentlyo No Do you feel uncomfortable with anyone in your familyo No Has anyone forced you do things that you didnt want to doo No Electronic Signature(s) Signed: 05/25/2023 12:35:20 PM By: [name] Entered By: [name] on 05/21/2023 05:23:02 -------------------------------------------------------------------------------- Activities of Daily Living Details Patient Name: Date of Service: [name] 05/21/2023 8:00 A M Medical Record Number: [id number] Patient Account Number: [id number] Date of Birth/Sex: Treating RN: 10-03-1990 (32 y.o. [name] Primary Care [name]: [name] ULT, PRO [name] Other Clinician: Referring [name]: Treating [name]/Extender: [name] in Treatment: 0 Activities of Daily Living Items Answer Activities of Daily Living (Please select one for each item) Drive Automobile Completely Able T Medications ake Completely Able Use T elephone Completely Able Care for Appearance Completely Able Use T oilet Completely Able Bath / Shower Completely Able Dress Self Completely Able Feed Self Completely Able Walk Completely Able Get In / Out Bed Completely Able Housework Completely Able Prepare Meals Completely Able Handle Money Completely Able Shop for Self Completely Able Electronic  Signature(s) Signed: 05/25/2023 12:35:20 PM By: [name] Entered By: [name] on 05/21/2023 05:23:33 [name] ([id number]) [id number] Page 2 of 4 -------------------------------------------------------------------------------- Education Screening Details Patient Name: Date of Service: [name] 05/21/2023 8:00 A M Medical Record Number: [id number] Patient Account Number: [id number] Date of Birth/Sex: Treating RN: 02-07-1991 (32 y.o. [name] Primary Care [name]: [name] ULT, PRO [name] Other Clinician: Referring [name]: Treating [name]/Extender: [name] in Treatment: 0 Learning Preferences/Education Level/Primary Language Highest Education Level: High School Preferred Language: English Cognitive Barrier Language Barrier: No Translator Needed: No Memory Deficit: No Emotional Barrier: No Cultural/Religious Beliefs Affecting Medical Care: No Physical Barrier Impaired Vision: No Impaired Hearing: No Decreased Hand dexterity: No Knowledge/Comprehension Knowledge Level: Medium Comprehension Level: Medium Ability to understand written instructions: Medium Ability to understand verbal instructions: Medium Motivation Anxiety Level: Calm Cooperation: Cooperative Education Importance: Acknowledges Need Interest in Health Problems: Asks Questions Perception: Coherent Willingness to Engage in Self-Management High Activities: Readiness to Engage in Self-Management High Activities: Electronic Signature(s) Signed: 05/25/2023 12:35:20 PM By: [name] Entered By: [name] on 05/21/2023 05:24:11 -------------------------------------------------------------------------------- Fall Risk Assessment Details Patient Name: Date of Service: [name] 05/21/2023 8:00 A M Medical Record Number: [id number] Patient Account Number: [id number] Date of Birth/Sex:  Treating RN: November 10, 1990 (32 y.o. [name] Primary Care [name]: [name] ULT, PRO [name] Other Clinician: Referring [name]: Treating [name]/Extender: [name] in Treatment: 0 Fall Risk Assessment Items Have you had 2 or more falls in the last 12 monthso 0 No Have you had any fall that resulted in injury in the last 12 monthso 0 No [address] [place], [name] ([id number]) [id number] Page 3 of 4 History of falling - immediate or within 3 months 0 No Secondary diagnosis (Do you have 2 or more medical diagnoseso)  0 No Ambulatory aid None/bed rest/wheelchair/nurse 0 No Crutches/cane/walker 0 No Furniture 0 No Intravenous therapy Access/Saline/Heparin Lock 0 No Gait/Transferring Normal/ bed rest/ wheelchair 0 No Weak (short steps with or without shuffle, stooped but able to lift head while walking, may seek 0 No support from furniture) Impaired (short steps with shuffle, may have difficulty arising from chair, head down, impaired 0 No balance) Mental Status Oriented to own ability 0 No Electronic Signature(s) Signed: 05/25/2023 12:35:20 PM By: [name] Entered By: [name] on 05/21/2023 05:24:20 -------------------------------------------------------------------------------- Foot Assessment Details Patient Name: Date of Service: [name] 05/21/2023 8:00 A M Medical Record Number: [id number] Patient Account Number: [id number] Date of Birth/Sex: Treating RN: [birth year] (32 y.o. [name] Primary Care [name]: [name] ULT, PRO [name] Other Clinician: Referring [name]: Treating [name]/Extender: [name] in Treatment: 0 Foot Assessment Items Site Locations + = Sensation present, - = Sensation absent, C = Callus, U = Ulcer R = Redness, W = Warmth, M = Maceration, PU = Pre-ulcerative lesion F = Fissure, S = Swelling, D =  Dryness Assessment Right: Left: Other Deformity: No No Prior Foot Ulcer: No No Prior Amputation: No No Charcot Joint: No No Ambulatory Status: Ambulatory Without Help GaitEMONTAE, [name] ([id number]) [phone] [id number] Page 4 of 4 Electronic Signature(s) Signed: 05/25/2023 12:35:20 PM By: [name] Entered By: [name] on 05/21/2023 05:28:45 -------------------------------------------------------------------------------- Nutrition Risk Screening Details Patient Name: Date of Service: [name] 05/21/2023 8:00 A M Medical Record Number: [id number] Patient Account Number: [id number] Date of Birth/Sex: Treating RN: 01/17/1991 (32 y.o. [name] Primary Care Quashon Jesus: [name] ULT, PRO [name] Other Clinician: Referring Glee Lashomb: Treating Jamaar Howes/Extender: [name] in Treatment: 0 Height (in): 71 Weight (lbs): 184 Body Mass Index (BMI): 25.7 Nutrition Risk Screening Items Score Screening NUTRITION RISK SCREEN: I have an illness or condition that made me change the kind and/or amount of food I eat 0 No I eat fewer than two meals per day 0 No I eat few fruits and vegetables, or milk products 0 No I have three or more drinks of beer, liquor or wine almost every day 0 No I have tooth or mouth problems that make it hard for me to eat 0 No I don't always have enough money to buy the food I need 0 No I eat alone most of the time 0 No I take three or more different prescribed or over-the-counter drugs a day 0 No Without wanting to, I have lost or gained 10 pounds in the last six months 0 No I am not always physically able to shop, cook and/or feed myself 0 No Nutrition Protocols Good Risk Protocol Moderate Risk Protocol High Risk Proctocol Risk Level: Good Risk Score: 0 Electronic Signature(s) Signed: 05/25/2023 12:35:20 PM By: [name] Entered By: [name] on 05/21/2023  05:24:32

## 2023-05-27 ENCOUNTER — Encounter (HOSPITAL_BASED_OUTPATIENT_CLINIC_OR_DEPARTMENT_OTHER): Payer: Medicare Other | Admitting: General Surgery

## 2023-05-27 DIAGNOSIS — L97322 Non-pressure chronic ulcer of left ankle with fat layer exposed: Secondary | ICD-10-CM | POA: Diagnosis not present

## 2023-05-28 LAB — DERMATOLOGY PATHOLOGY

## 2023-05-28 NOTE — Progress Notes (Signed)
Target Resolution Date: 07/09/2023 Goal Status: Active Interventions: Assess patient/caregiver ability to obtain necessary supplies [place], [name] ([id number]) [id number] Page 5 of 9 Assess patient/caregiver ability to perform ulcer/skin care regimen upon admission and as needed Assess ulceration(s) every visit Provide education on ulcer and skin care Screen for HBO Treatment Activities: Skin care regimen initiated : 05/21/2023 Topical wound management initiated : 05/21/2023 Notes: Electronic Signature(s) Signed: 05/28/2023 4:20:28 PM By: Brenton Grills Entered By: Brenton Grills on 05/27/2023 13:09:51 -------------------------------------------------------------------------------- Pain Assessment Details Patient Name: Date of Service: [name] 05/27/2023 1:00 PM Medical Record Number: [id number] Patient Account Number: [id number] Date of Birth/Sex: Treating RN: Apr 14, 1991 (32 y.o. [name] Primary Care Shaton Lore: [name], PRO [name] Other Clinician: Referring Tysheem Accardo: Treating Naiara Lombardozzi/Extender: [name] in Treatment: 0 Active Problems Location of Pain Severity and Description of Pain Patient Has Paino Yes Site Locations Rate the pain. Current Pain Level: 10 Pain Management and Medication Current Pain Management: Electronic Signature(s) Signed: 05/28/2023 4:20:28 PM By: Brenton Grills Entered By: Brenton Grills on 05/27/2023 12:55:45 -------------------------------------------------------------------------------- Patient/Caregiver Education Details Patient Name: Date of Service: 646 Pineda. [name] 9/18/2024andnbsp1:00 PM Skelp, [name] ([id number]) [id number] Page 6 of 9 Medical Record Number: [id number] Patient Account Number: [id number] Date of Birth/Gender: Treating RN: 1991-08-13 (32 y.o. [name] Primary Care Physician: [name], [name] Other Clinician: Referring Physician: Treating Physician/Extender: [name] in Treatment: 0 Education Assessment Education Provided To: Patient Education Topics Provided Wound/Skin Impairment: Methods: Explain/Verbal Responses: State content correctly Electronic Signature(s) Signed: 05/28/2023 4:20:28 PM By: Brenton Grills Entered By: Brenton Grills on 05/27/2023 13:10:05 -------------------------------------------------------------------------------- Wound Assessment Details Patient Name: Date of Service: [name] 05/27/2023 1:00 PM Medical Record Number:  [id number] Patient Account Number: [id number] Date of Birth/Sex: Treating RN: 11/28/1990 (32 y.o. [name] Primary Care Aliyanna Wassmer: [name], [name] Other Clinician: Referring Summer Mccolgan: Treating Addis Tuohy/Extender: [name] in Treatment: 0 Wound Status Wound Number: 1 Primary Etiology: Calciphylaxis Wound Location: Left, Lateral Lower Leg Wound Status: Open Wounding Event: Bite Comorbid History: Hypertension, End Stage Renal Disease Date Acquired: 03/09/2023 Weeks Of Treatment: 0 Clustered Wound: No Photos Wound Measurements Length: (cm) 6.5 Width: (cm) 6 Depth: (cm) 0.2 Area: (cm) 30.631 Volume: (cm) 6.126 % Reduction in Area: 25.7% % Reduction in Volume: 25.7% Tunneling: No Undermining: No Wound Description Classification: Full Thickness Without Exposed Support Structures Wound Margin: Distinct, outline attached Exudate Amount: Medium Exudate Type: Serosanguineous Romberger, Renald ([id number]) Exudate Color: red, brown Foul Odor After Cleansing: No Slough/Fibrino Yes [id number] Page 7 of 9 Wound Bed Granulation Amount: Medium (34-66%) Exposed Structure Granulation Quality: Red, Pink Fat Layer (Subcutaneous Tissue) Exposed: Yes Necrotic Amount: Medium (34-66%) Necrotic Quality: Adherent Slough Periwound Skin Texture Texture Color No Abnormalities Noted: Yes No Abnormalities Noted: No Hemosiderin Staining: Yes Moisture No Abnormalities Noted: No Dry / Scaly: No Maceration: No Treatment Notes Wound #1 (Lower Leg) Wound Laterality: Left, Lateral Cleanser Soap and Water Discharge Instruction: May shower and wash wound with dial antibacterial soap and water prior to dressing change. Vashe 5.8 (oz) Discharge Instruction: Cleanse the wound with Vashe prior to applying a clean dressing using gauze sponges, not tissue or cotton balls. Peri-Wound Care Silver Sulfadiazine Cream 1%, 25 (g),  tube Topical Primary Dressing Hydrofera Blue Ready Transfer Foam, 4x5 (in/in) Discharge Instruction: Apply to wound bed as instructed Secondary Dressing Zetuvit Plus Silicone Border Heel Dressing10x10 (in/in) Discharge  Instruction: Apply silicone border over primary dressing as directed. Secured With Compression Wrap Compression Stockings Add-Ons Electronic Signature(s) Signed: 05/28/2023 4:20:28 PM By: Brenton Grills Entered By: Brenton Grills on 05/27/2023 13:04:57 -------------------------------------------------------------------------------- Wound Assessment Details Patient Name: Date of Service: [name] 05/27/2023 1:00 PM Medical Record Number: [id number] Patient Account Number: [id number] Date of Birth/Sex: Treating RN: 1990/12/23 (32 y.o. [name] Primary Care Sumaiya Arruda: [name], PRO [name] Other Clinician: Referring Mehar Kirkwood: Treating Nicasio Barlowe/Extender: [name] in Treatment: 0 Wound Status Wound Number: 2 Primary Etiology: Calciphylaxis Wound Location: Left, Medial Ankle Wound Status: Open Wounding Event: Bite Comorbid History: Hypertension, End Stage Renal Disease Date Acquired: 03/09/2023 Weeks Of Treatment: 0 Clustered Wound: No [name] ([id number]) [id number] Page 8 of 9 Photos Wound Measurements Length: (cm) 1.5 Width: (cm) 0.4 Depth: (cm) 0.1 Area: (cm) 0.471 Volume: (cm) 0.047 % Reduction in Area: 40% % Reduction in Volume: 40.5% Tunneling: No Undermining: No Wound Description Classification: Full Thickness Without Exposed Support Structures Wound Margin: Distinct, outline attached Exudate Amount: Medium Exudate Type: Serosanguineous Exudate Color: red, brown Foul Odor After Cleansing: No Slough/Fibrino Yes Wound Bed Granulation Amount: Medium (34-66%) Exposed Structure Granulation Quality: Red, Pink Fat Layer (Subcutaneous Tissue) Exposed: Yes Necrotic  Amount: Medium (34-66%) Necrotic Quality: Eschar, Adherent Slough Periwound Skin Texture Texture Color No Abnormalities Noted: Yes No Abnormalities Noted: Yes Moisture Temperature / Pain No Abnormalities Noted: No Temperature: No Abnormality Tenderness on Palpation: Yes Treatment Notes Wound #2 (Ankle) Wound Laterality: Left, Medial Cleanser Soap and Water Discharge Instruction: May shower and wash wound with dial antibacterial soap and water prior to dressing change. Vashe 5.8 (oz) Discharge Instruction: Cleanse the wound with Vashe prior to applying a clean dressing using gauze sponges, not tissue or cotton balls. Peri-Wound Care Silver Sulfadiazine Cream 1%, 25 (g), tube Topical Primary Dressing Hydrofera Blue Ready Transfer Foam, 4x5 (in/in) Discharge Instruction: Apply to wound bed as instructed Secondary Dressing Zetuvit Plus Silicone Border Heel Dressing10x10 (in/in) Discharge Instruction: Apply silicone border over primary dressing as directed. Secured With Compression Wrap Compression Stockings Facilities manager) Signed: 05/28/2023 4:20:28 PM By: Leilani Able, [name] ([id number]) By: Alphonzo Lemmings.pdf Page 9 of 9 Signed: 05/28/2023 4:20:28 PM Entered By: Brenton Grills on 05/27/2023 13:05:41 -------------------------------------------------------------------------------- Vitals Details Patient Name: Date of Service: [place] RTA Ardean Larsen 05/27/2023 1:00 PM Medical Record Number: [id number] Patient Account Number: [id number] Date of Birth/Sex: Treating RN: 03/08/1991 (32 y.o. [name] Primary Care Sriya Kroeze: [name], [name] Other Clinician: Referring Turkessa Ostrom: Treating Timberlee Roblero/Extender: [name] in Treatment: 0 Vital Signs Time Taken: 12:53 Temperature (F): 98.7 Height (in): 71 Pulse (bpm): 69 Weight (lbs): 184 Respiratory Rate (breaths/min): 18 Body Mass  Index (BMI): 25.7 Blood Pressure (mmHg): 170/97 Reference Range: 80 - 120 mg / dl Electronic Signature(s) Signed: 05/28/2023 4:20:28 PM By: Brenton Grills Entered By: Brenton Grills on 05/27/2023 12:55:20  Target Resolution Date: 07/09/2023 Goal Status: Active Interventions: Assess patient/caregiver ability to obtain necessary supplies [place], [name] ([id number]) [id number] Page 5 of 9 Assess patient/caregiver ability to perform ulcer/skin care regimen upon admission and as needed Assess ulceration(s) every visit Provide education on ulcer and skin care Screen for HBO Treatment Activities: Skin care regimen initiated : 05/21/2023 Topical wound management initiated : 05/21/2023 Notes: Electronic Signature(s) Signed: 05/28/2023 4:20:28 PM By: Brenton Grills Entered By: Brenton Grills on 05/27/2023 13:09:51 -------------------------------------------------------------------------------- Pain Assessment Details Patient Name: Date of Service: [name] 05/27/2023 1:00 PM Medical Record Number: [id number] Patient Account Number: [id number] Date of Birth/Sex: Treating RN: Apr 14, 1991 (32 y.o. [name] Primary Care Shaton Lore: [name], PRO [name] Other Clinician: Referring Tysheem Accardo: Treating Naiara Lombardozzi/Extender: [name] in Treatment: 0 Active Problems Location of Pain Severity and Description of Pain Patient Has Paino Yes Site Locations Rate the pain. Current Pain Level: 10 Pain Management and Medication Current Pain Management: Electronic Signature(s) Signed: 05/28/2023 4:20:28 PM By: Brenton Grills Entered By: Brenton Grills on 05/27/2023 12:55:45 -------------------------------------------------------------------------------- Patient/Caregiver Education Details Patient Name: Date of Service: 646 Pineda. [name] 9/18/2024andnbsp1:00 PM [place], [name] ([id number]) [id number] Page 6 of 9 Medical Record Number: [id number] Patient Account Number: [id number] Date of Birth/Gender: Treating RN: 1991-08-13 (32 y.o. [name] Primary Care Physician: [name], [name] Other Clinician: Referring Physician: Treating Physician/Extender: [name] in Treatment: 0 Education Assessment Education Provided To: Patient Education Topics Provided Wound/Skin Impairment: Methods: Explain/Verbal Responses: State content correctly Electronic Signature(s) Signed: 05/28/2023 4:20:28 PM By: Brenton Grills Entered By: Brenton Grills on 05/27/2023 13:10:05 -------------------------------------------------------------------------------- Wound Assessment Details Patient Name: Date of Service: [name] 05/27/2023 1:00 PM Medical Record Number:  [id number] Patient Account Number: [id number] Date of Birth/Sex: Treating RN: 11/28/1990 (32 y.o. [name] Primary Care Aliyanna Wassmer: [name], [name] Other Clinician: Referring Summer Mccolgan: Treating Addis Tuohy/Extender: [name] in Treatment: 0 Wound Status Wound Number: 1 Primary Etiology: Calciphylaxis Wound Location: Left, Lateral Lower Leg Wound Status: Open Wounding Event: Bite Comorbid History: Hypertension, End Stage Renal Disease Date Acquired: 03/09/2023 Weeks Of Treatment: 0 Clustered Wound: No Photos Wound Measurements Length: (cm) 6.5 Width: (cm) 6 Depth: (cm) 0.2 Area: (cm) 30.631 Volume: (cm) 6.126 % Reduction in Area: 25.7% % Reduction in Volume: 25.7% Tunneling: No Undermining: No Wound Description Classification: Full Thickness Without Exposed Support Structures Wound Margin: Distinct, outline attached Exudate Amount: Medium Exudate Type: Serosanguineous Romberger, Renald ([id number]) Exudate Color: red, brown Foul Odor After Cleansing: No Slough/Fibrino Yes [id number] Page 7 of 9 Wound Bed Granulation Amount: Medium (34-66%) Exposed Structure Granulation Quality: Red, Pink Fat Layer (Subcutaneous Tissue) Exposed: Yes Necrotic Amount: Medium (34-66%) Necrotic Quality: Adherent Slough Periwound Skin Texture Texture Color No Abnormalities Noted: Yes No Abnormalities Noted: No Hemosiderin Staining: Yes Moisture No Abnormalities Noted: No Dry / Scaly: No Maceration: No Treatment Notes Wound #1 (Lower Leg) Wound Laterality: Left, Lateral Cleanser Soap and Water Discharge Instruction: May shower and wash wound with dial antibacterial soap and water prior to dressing change. Vashe 5.8 (oz) Discharge Instruction: Cleanse the wound with Vashe prior to applying a clean dressing using gauze sponges, not tissue or cotton balls. Peri-Wound Care Silver Sulfadiazine Cream 1%, 25 (g),  tube Topical Primary Dressing Hydrofera Blue Ready Transfer Foam, 4x5 (in/in) Discharge Instruction: Apply to wound bed as instructed Secondary Dressing Zetuvit Plus Silicone Border Heel Dressing10x10 (in/in) Discharge  Instruction: Apply silicone border over primary dressing as directed. Secured With Compression Wrap Compression Stockings Add-Ons Electronic Signature(s) Signed: 05/28/2023 4:20:28 PM By: Brenton Grills Entered By: Brenton Grills on 05/27/2023 13:04:57 -------------------------------------------------------------------------------- Wound Assessment Details Patient Name: Date of Service: [name] 05/27/2023 1:00 PM Medical Record Number: [id number] Patient Account Number: [id number] Date of Birth/Sex: Treating RN: 1990/12/23 (32 y.o. [name] Primary Care Sumaiya Arruda: [name], PRO [name] Other Clinician: Referring Mehar Kirkwood: Treating Nicasio Barlowe/Extender: [name] in Treatment: 0 Wound Status Wound Number: 2 Primary Etiology: Calciphylaxis Wound Location: Left, Medial Ankle Wound Status: Open Wounding Event: Bite Comorbid History: Hypertension, End Stage Renal Disease Date Acquired: 03/09/2023 Weeks Of Treatment: 0 Clustered Wound: No [name] ([id number]) [id number] Page 8 of 9 Photos Wound Measurements Length: (cm) 1.5 Width: (cm) 0.4 Depth: (cm) 0.1 Area: (cm) 0.471 Volume: (cm) 0.047 % Reduction in Area: 40% % Reduction in Volume: 40.5% Tunneling: No Undermining: No Wound Description Classification: Full Thickness Without Exposed Support Structures Wound Margin: Distinct, outline attached Exudate Amount: Medium Exudate Type: Serosanguineous Exudate Color: red, brown Foul Odor After Cleansing: No Slough/Fibrino Yes Wound Bed Granulation Amount: Medium (34-66%) Exposed Structure Granulation Quality: Red, Pink Fat Layer (Subcutaneous Tissue) Exposed: Yes Necrotic  Amount: Medium (34-66%) Necrotic Quality: Eschar, Adherent Slough Periwound Skin Texture Texture Color No Abnormalities Noted: Yes No Abnormalities Noted: Yes Moisture Temperature / Pain No Abnormalities Noted: No Temperature: No Abnormality Tenderness on Palpation: Yes Treatment Notes Wound #2 (Ankle) Wound Laterality: Left, Medial Cleanser Soap and Water Discharge Instruction: May shower and wash wound with dial antibacterial soap and water prior to dressing change. Vashe 5.8 (oz) Discharge Instruction: Cleanse the wound with Vashe prior to applying a clean dressing using gauze sponges, not tissue or cotton balls. Peri-Wound Care Silver Sulfadiazine Cream 1%, 25 (g), tube Topical Primary Dressing Hydrofera Blue Ready Transfer Foam, 4x5 (in/in) Discharge Instruction: Apply to wound bed as instructed Secondary Dressing Zetuvit Plus Silicone Border Heel Dressing10x10 (in/in) Discharge Instruction: Apply silicone border over primary dressing as directed. Secured With Compression Wrap Compression Stockings Facilities manager) Signed: 05/28/2023 4:20:28 PM By: Leilani Able, [name] ([id number]) By: Alphonzo Lemmings.pdf Page 9 of 9 Signed: 05/28/2023 4:20:28 PM Entered By: Brenton Grills on 05/27/2023 13:05:41 -------------------------------------------------------------------------------- Vitals Details Patient Name: Date of Service: [place] RTA Ardean Larsen 05/27/2023 1:00 PM Medical Record Number: [id number] Patient Account Number: [id number] Date of Birth/Sex: Treating RN: 03/08/1991 (32 y.o. [name] Primary Care Sriya Kroeze: [name], [name] Other Clinician: Referring Turkessa Ostrom: Treating Timberlee Roblero/Extender: [name] in Treatment: 0 Vital Signs Time Taken: 12:53 Temperature (F): 98.7 Height (in): 71 Pulse (bpm): 69 Weight (lbs): 184 Respiratory Rate (breaths/min): 18 Body Mass  Index (BMI): 25.7 Blood Pressure (mmHg): 170/97 Reference Range: 80 - 120 mg / dl Electronic Signature(s) Signed: 05/28/2023 4:20:28 PM By: Brenton Grills Entered By: Brenton Grills on 05/27/2023 12:55:20

## 2023-05-28 NOTE — Progress Notes (Signed)
DINESH, GASSETT ([id number]) [id number] Page 1 of 9 Visit Report for 05/27/2023 Chief Complaint Document Details Patient Name: Date of Service: CA RTA Ardean Larsen 05/27/2023 1:00 PM Medical Record Number: [id number] Patient Account Number: [id number] Date of Birth/Sex: Treating RN: Apr 23, 1991 (32 y.o. 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 [name] Primary Care Provider: [name], PRO [name] Other Clinician: Referring Provider: Treating Provider/Extender: [name] in Treatment: 0 Debridement Performed for Assessment: Wound #1 Left,Lateral Lower Leg Performed By: Physician Duanne Guess, MD The following information was scribed by: Brenton Grills The information was scribed for: Duanne Guess Debridement Type: Debridement Level of Consciousness (Pre-procedure): Awake and Alert Pre-procedure Verification/Time Out Yes - 13:30 Taken: Start Time: 13:31 Pain Control: Lidocaine 4% T opical Solution Percent of Wound Bed Debrided: 100% T Area Debrided (cm): otal 30.62 Tissue and other material debrided: Non-Viable, Slough, Subcutaneous, Slough Level: Skin/Subcutaneous Tissue Debridement Description: Excisional Instrument: Curette,  Nippers, Scissors Bleeding: Minimum Hemostasis Achieved: Pressure Response to Treatment: Procedure was tolerated well Level of Consciousness (Post- Awake and Alert procedure): Post Debridement Measurements of Total Wound Length: (cm) 6.5 Width: (cm) 6 Depth: (cm) 0.2 Volume: (cm) 6.126 Character of Wound/Ulcer Post Debridement: Improved Post Procedure Diagnosis [name] [id number] Page 2 of 9 Same as Pre-procedure Electronic Signature(s) Signed: 05/27/2023 4:02:03 PM By: [name] Signed: 05/28/2023 4:20:28 PM By: Brenton Grills Entered By: Brenton Grills on 05/27/2023 13:35:36 -------------------------------------------------------------------------------- HPI Details Patient Name: Date of Service: CA RTA [name] 05/27/2023 1:00 PM Medical Record Number: [id number] Patient Account Number: [id number] Date of Birth/Sex: Treating RN: 03/20/1991 (32 y.o. M) Primary Care Provider: [name], [name] Other Clinician: Referring Provider: Treating Provider/Extender: [name] in Treatment: 0 History of Present Illness HPI Description: ADMISSION 05/21/2023 This is a 32 year old man with end-stage renal disease on hemodialysis. In July of this year, he was seen by podiatry for "cellulitis with an open wound on his left foot and ankle.". He apparently was given clindamycin and mupirocin by urgent care prior to his visit with Dr. Annamary Rummage. At that time, the wound was felt to be improving on the prescribed regimen. About 3 weeks later, the wound was worsening. 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 The patient has presented to the emergency department in [facility] several times due to increased pain. He was apparently referred to plastic surgery from the ED. He saw  Dr. 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 According to the dialysis rounding note from Tuesday of this week, sodium thiosulfate is being empirically initiated. I still do not see that a biopsy has yet been performed. He is not currently taking Sensipar, as far as I can see. His intact PTH is actually not particularly  high with his last value being 173 and his calcium phosphorus product only 55, although his phosphorus has only recently started to come under control with previous values in the 7-8 range. 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 05/27/2023: The wounds actually look better this week. There has been some epithelialization. The biopsy site has a fair amount of slough accumulation. The wounds are still quite painful. He reports that they have definitely started sodium thiosulfate at his dialysis sessions and he received a call from [facility] regarding the referral for parathyroidectomy, but he was unable to answer and will be calling them back. No results from the biopsy so far. Electronic Signature(s) Signed: 05/27/2023 2:15:37 PM By: [name] Entered By: Duanne Guess on 05/27/2023 14:15:37 -------------------------------------------------------------------------------- Physical Exam Details Patient Name: Date of Service: CA RTA [name] 05/27/2023 1:00 PM Medical Record Number: [id number] Patient Account Number: [id number] Date of Birth/Sex: Treating RN: 19-Apr-1991 (32 y.o. M) Primary Care Provider: [name], [name] Other Clinician: Referring Provider: Treating Provider/Extender: [name] in Treatment: 0 Constitutional Hypertensive, asymptomatic. . . . no acute distress. Respiratory Normal work of  breathing on room air. Notes 05/27/2023: The wounds actually look better this week. There has been some epithelialization. The biopsy site has a fair amount of slough accumulation. The wounds are still quite painful. LORIE, KUTZER ([id number]) [id number] Page 3 of 9 Electronic Signature(s) Signed: 05/27/2023 2:30:34 PM By: [name] Entered By: Duanne Guess on 05/27/2023 14:30:34 -------------------------------------------------------------------------------- Physician Orders Details Patient Name: Date of Service: CA RTA [name] 05/27/2023 1:00 PM Medical Record Number: [id number] Patient Account Number: [id number] Date of Birth/Sex: Treating RN: [birth year] (32 y.o. 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 - Dr Lady Gary - Please make appt Return A Anesthetic Wound #1 Left,Lateral Lower Leg (In clinic) Topical Lidocaine 4% applied to wound bed - May also buy over the counter and use with other treatments Wound #2 Left,Medial Ankle (In clinic) Topical Lidocaine 4% applied to wound bed Bathing/ Shower/ Hygiene May shower with protection but do not get wound dressing(s) wet. Protect dressing(s) with water repellant cover (for example, large plastic bag) or a cast cover and may then take shower. Off-Loading Crutches for Offloading - Please use two crutches for offloading. Wound  Treatment Wound #1 - Lower Leg Wound Laterality: Left, Lateral Cleanser: Soap and Water 1 x Per Day/30 Days Discharge Instructions: May shower and wash wound with dial antibacterial soap and water prior to dressing change. Cleanser: Vashe 5.8 (oz) 1 x Per Day/30 Days Discharge Instructions: Cleanse the wound with Vashe prior to applying a clean dressing using gauze sponges, not tissue or cotton balls. Peri-Wound Care: Silver Sulfadiazine Cream 1%, 25 (g), tube (Generic) 1 x Per Day/30 Days Prim Dressing: Hydrofera Blue Ready Transfer Foam, 4x5 (in/in) (Generic) 1 x Per Day/30 Days ary Discharge Instructions: Apply to wound bed as instructed Secondary Dressing: Zetuvit Plus Silicone Border  wounds are still quite painful. I used a curette to gently debride the surface slough from his wounds. I used a curette and scissors and forceps to debride the nonviable subcutaneous tissue from the biopsy site. The silver sulfadiazine seems to be continuing to help his discomfort so we will continue to use this along with Hydrofera Blue. I am glad they are using sodium thiosulfate with his dialysis sessions as he does appear to be making some forward progress with his wounds. We will contact pathology to get the biopsy results. He will get in touch with [facility] regarding the endocrine surgery referral I sent in. Follow-up in 1 week. Electronic  Signature(s) Signed: 05/27/2023 2:32:51 PM By: [name] Entered By: Duanne Guess on 05/27/2023 14:32:51 -------------------------------------------------------------------------------- HxROS Details Patient Name: Date of Service: CA RTA [name] 05/27/2023 1:00 PM Medical Record Number: [id number] Patient Account Number: [id number] Date of Birth/Sex: Treating RN: 26-Nov-1990 (32 y.o. M) Primary Care Provider: [name], [name] Other Clinician: Referring Provider: Treating Provider/Extender: [name] in Treatment: 0 Information Obtained From Chart Cardiovascular Medical History: Positive for: Hypertension Genitourinary Medical History: Positive for: End Stage Renal Disease Past Medical History Notes: GI bleed, ulcer Psychiatric HESSTON, DOSCH ([id number]) [id number] Page 8 of 9 Medical History: Past Medical History Notes: anxiety, depression Immunizations Pneumococcal Vaccine: Received Pneumococcal Vaccination: No Implantable Devices No devices added Hospitalization / Surgery History Type of Hospitalization/Surgery av fistula placement EGD Family and Social History Diabetes: Yes - Mother; Heart Disease: Yes - Mother; Hypertension: Yes - Mother; Kidney Disease: Yes - Siblings; Former smoker - ended on 03/09/2019; Alcohol Use: Never; Drug Use: No History; Caffeine Use: Never; Financial Concerns: No; Food, Clothing or Shelter Needs: No; Support System Lacking: No; Transportation Concerns: No Electronic Signature(s) Signed: 05/27/2023 4:02:03 PM By: [name] Entered By: Duanne Guess on 05/27/2023 14:29:43 -------------------------------------------------------------------------------- SuperBill Details Patient Name: Date of Service: CA RTA Marilynne Halsted N 05/27/2023 Medical Record Number: [id number] Patient Account Number: [id number] Date of Birth/Sex: Treating  RN: May 30, 1991 (32 y.o. 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 N18.6 End stage renal disease Z99.2 Dependence on renal dialysis Facility Procedures : CPT4 Code: [id number] Description: 11042 - DEB SUBQ TISSUE 20 SQ CM/< ICD-10 Diagnosis Description L97.322 Non-pressure chronic ulcer of left ankle with fat layer exposed Modifier: Quantity: 1 : CPT4 Code: [id number] Description: 11045 - DEB SUBQ TISS EA ADDL 20CM ICD-10 Diagnosis Description L97.322 Non-pressure chronic ulcer of left ankle with fat layer exposed Modifier: Quantity: 1 Physician Procedures : CPT4 Code Description Modifier [id number] 99214 - WC PHYS LEVEL 4 - EST PT 25 ICD-10 Diagnosis Description 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 N18.6 End stage renal disease  Z99.2 Dependence on renal dialysis AXSEL, MIKLE ([id number]) [id number] Quantity: 1 27.pdf Page 9 of 9 : [id number] 11042 - WC PHYS SUBQ TISS 20 SQ CM 1 ICD-10 Diagnosis Description L97.322 Non-pressure chronic ulcer of left ankle with fat layer exposed Quantity: : [id number] 11045 - WC PHYS SUBQ TISS EA ADDL 20 CM 1 ICD-10 Diagnosis Description L97.322 Non-pressure chronic ulcer of left ankle with fat layer exposed Quantity: Electronic Signature(s) Signed: 05/27/2023 2:33:21 PM By: Duanne Guess MD  high with his last value being 173 and his calcium phosphorus product only 55, although his phosphorus has only recently started to come under control with previous values in the 7-8 range. 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 05/27/2023: The wounds actually look better this week. There has been some epithelialization. The biopsy site has a fair amount of slough accumulation. The wounds are still quite painful. He reports that they have definitely started sodium thiosulfate at his dialysis sessions and he received a call from [facility] regarding the referral for parathyroidectomy, but he was unable to answer and will be calling them back. No results from the biopsy so far. Electronic Signature(s) Signed: 05/27/2023 2:15:37 PM By: [name] Entered By: Duanne Guess on 05/27/2023 14:15:37 -------------------------------------------------------------------------------- Physical Exam Details Patient Name: Date of Service: CA RTA [name] 05/27/2023 1:00 PM Medical Record Number: [id number] Patient Account Number: [id number] Date of Birth/Sex: Treating RN: 19-Apr-1991 (32 y.o. M) Primary Care Provider: [name], [name] Other Clinician: Referring Provider: Treating Provider/Extender: [name] in Treatment: 0 Constitutional Hypertensive, asymptomatic. . . . no acute distress. Respiratory Normal work of  breathing on room air. Notes 05/27/2023: The wounds actually look better this week. There has been some epithelialization. The biopsy site has a fair amount of slough accumulation. The wounds are still quite painful. LORIE, KUTZER ([id number]) [id number] Page 3 of 9 Electronic Signature(s) Signed: 05/27/2023 2:30:34 PM By: [name] Entered By: Duanne Guess on 05/27/2023 14:30:34 -------------------------------------------------------------------------------- Physician Orders Details Patient Name: Date of Service: CA RTA [name] 05/27/2023 1:00 PM Medical Record Number: [id number] Patient Account Number: [id number] Date of Birth/Sex: Treating RN: [birth year] (32 y.o. 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 - Dr Lady Gary - Please make appt Return A Anesthetic Wound #1 Left,Lateral Lower Leg (In clinic) Topical Lidocaine 4% applied to wound bed - May also buy over the counter and use with other treatments Wound #2 Left,Medial Ankle (In clinic) Topical Lidocaine 4% applied to wound bed Bathing/ Shower/ Hygiene May shower with protection but do not get wound dressing(s) wet. Protect dressing(s) with water repellant cover (for example, large plastic bag) or a cast cover and may then take shower. Off-Loading Crutches for Offloading - Please use two crutches for offloading. Wound  Treatment Wound #1 - Lower Leg Wound Laterality: Left, Lateral Cleanser: Soap and Water 1 x Per Day/30 Days Discharge Instructions: May shower and wash wound with dial antibacterial soap and water prior to dressing change. Cleanser: Vashe 5.8 (oz) 1 x Per Day/30 Days Discharge Instructions: Cleanse the wound with Vashe prior to applying a clean dressing using gauze sponges, not tissue or cotton balls. Peri-Wound Care: Silver Sulfadiazine Cream 1%, 25 (g), tube (Generic) 1 x Per Day/30 Days Prim Dressing: Hydrofera Blue Ready Transfer Foam, 4x5 (in/in) (Generic) 1 x Per Day/30 Days ary Discharge Instructions: Apply to wound bed as instructed Secondary Dressing: Zetuvit Plus Silicone Border  DINESH, GASSETT ([id number]) [id number] Page 1 of 9 Visit Report for 05/27/2023 Chief Complaint Document Details Patient Name: Date of Service: CA RTA Ardean Larsen 05/27/2023 1:00 PM Medical Record Number: [id number] Patient Account Number: [id number] Date of Birth/Sex: Treating RN: Apr 23, 1991 (32 y.o. 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 [name] Primary Care Provider: [name], PRO [name] Other Clinician: Referring Provider: Treating Provider/Extender: [name] in Treatment: 0 Debridement Performed for Assessment: Wound #1 Left,Lateral Lower Leg Performed By: Physician Duanne Guess, MD The following information was scribed by: Brenton Grills The information was scribed for: Duanne Guess Debridement Type: Debridement Level of Consciousness (Pre-procedure): Awake and Alert Pre-procedure Verification/Time Out Yes - 13:30 Taken: Start Time: 13:31 Pain Control: Lidocaine 4% T opical Solution Percent of Wound Bed Debrided: 100% T Area Debrided (cm): otal 30.62 Tissue and other material debrided: Non-Viable, Slough, Subcutaneous, Slough Level: Skin/Subcutaneous Tissue Debridement Description: Excisional Instrument: Curette,  Nippers, Scissors Bleeding: Minimum Hemostasis Achieved: Pressure Response to Treatment: Procedure was tolerated well Level of Consciousness (Post- Awake and Alert procedure): Post Debridement Measurements of Total Wound Length: (cm) 6.5 Width: (cm) 6 Depth: (cm) 0.2 Volume: (cm) 6.126 Character of Wound/Ulcer Post Debridement: Improved Post Procedure Diagnosis [name] [id number] Page 2 of 9 Same as Pre-procedure Electronic Signature(s) Signed: 05/27/2023 4:02:03 PM By: [name] Signed: 05/28/2023 4:20:28 PM By: Brenton Grills Entered By: Brenton Grills on 05/27/2023 13:35:36 -------------------------------------------------------------------------------- HPI Details Patient Name: Date of Service: CA RTA [name] 05/27/2023 1:00 PM Medical Record Number: [id number] Patient Account Number: [id number] Date of Birth/Sex: Treating RN: 03/20/1991 (32 y.o. M) Primary Care Provider: [name], [name] Other Clinician: Referring Provider: Treating Provider/Extender: [name] in Treatment: 0 History of Present Illness HPI Description: ADMISSION 05/21/2023 This is a 32 year old man with end-stage renal disease on hemodialysis. In July of this year, he was seen by podiatry for "cellulitis with an open wound on his left foot and ankle.". He apparently was given clindamycin and mupirocin by urgent care prior to his visit with Dr. Annamary Rummage. At that time, the wound was felt to be improving on the prescribed regimen. About 3 weeks later, the wound was worsening. 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 The patient has presented to the emergency department in [facility] several times due to increased pain. He was apparently referred to plastic surgery from the ED. He saw  Dr. 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 According to the dialysis rounding note from Tuesday of this week, sodium thiosulfate is being empirically initiated. I still do not see that a biopsy has yet been performed. He is not currently taking Sensipar, as far as I can see. His intact PTH is actually not particularly  wounds are still quite painful. I used a curette to gently debride the surface slough from his wounds. I used a curette and scissors and forceps to debride the nonviable subcutaneous tissue from the biopsy site. The silver sulfadiazine seems to be continuing to help his discomfort so we will continue to use this along with Hydrofera Blue. I am glad they are using sodium thiosulfate with his dialysis sessions as he does appear to be making some forward progress with his wounds. We will contact pathology to get the biopsy results. He will get in touch with [facility] regarding the endocrine surgery referral I sent in. Follow-up in 1 week. Electronic  Signature(s) Signed: 05/27/2023 2:32:51 PM By: [name] Entered By: Duanne Guess on 05/27/2023 14:32:51 -------------------------------------------------------------------------------- HxROS Details Patient Name: Date of Service: CA RTA [name] 05/27/2023 1:00 PM Medical Record Number: [id number] Patient Account Number: [id number] Date of Birth/Sex: Treating RN: 26-Nov-1990 (32 y.o. M) Primary Care Provider: [name], [name] Other Clinician: Referring Provider: Treating Provider/Extender: [name] in Treatment: 0 Information Obtained From Chart Cardiovascular Medical History: Positive for: Hypertension Genitourinary Medical History: Positive for: End Stage Renal Disease Past Medical History Notes: GI bleed, ulcer Psychiatric HESSTON, DOSCH ([id number]) [id number] Page 8 of 9 Medical History: Past Medical History Notes: anxiety, depression Immunizations Pneumococcal Vaccine: Received Pneumococcal Vaccination: No Implantable Devices No devices added Hospitalization / Surgery History Type of Hospitalization/Surgery av fistula placement EGD Family and Social History Diabetes: Yes - Mother; Heart Disease: Yes - Mother; Hypertension: Yes - Mother; Kidney Disease: Yes - Siblings; Former smoker - ended on 03/09/2019; Alcohol Use: Never; Drug Use: No History; Caffeine Use: Never; Financial Concerns: No; Food, Clothing or Shelter Needs: No; Support System Lacking: No; Transportation Concerns: No Electronic Signature(s) Signed: 05/27/2023 4:02:03 PM By: [name] Entered By: Duanne Guess on 05/27/2023 14:29:43 -------------------------------------------------------------------------------- SuperBill Details Patient Name: Date of Service: CA RTA Marilynne Halsted N 05/27/2023 Medical Record Number: [id number] Patient Account Number: [id number] Date of Birth/Sex: Treating  RN: May 30, 1991 (32 y.o. 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 N18.6 End stage renal disease Z99.2 Dependence on renal dialysis Facility Procedures : CPT4 Code: [id number] Description: 11042 - DEB SUBQ TISSUE 20 SQ CM/< ICD-10 Diagnosis Description L97.322 Non-pressure chronic ulcer of left ankle with fat layer exposed Modifier: Quantity: 1 : CPT4 Code: [id number] Description: 11045 - DEB SUBQ TISS EA ADDL 20CM ICD-10 Diagnosis Description L97.322 Non-pressure chronic ulcer of left ankle with fat layer exposed Modifier: Quantity: 1 Physician Procedures : CPT4 Code Description Modifier [id number] - WC PHYS LEVEL 4 - EST PT 25 ICD-10 Diagnosis Description 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 N18.6 End stage renal disease  Z99.2 Dependence on renal dialysis AXSEL, MIKLE ([id number]) [id number] Quantity: 1 27.pdf Page 9 of 9 : [id number] 11042 - WC PHYS SUBQ TISS 20 SQ CM 1 ICD-10 Diagnosis Description L97.322 Non-pressure chronic ulcer of left ankle with fat layer exposed Quantity: : [id number] 11045 - WC PHYS SUBQ TISS EA ADDL 20 CM 1 ICD-10 Diagnosis Description L97.322 Non-pressure chronic ulcer of left ankle with fat layer exposed Quantity: Electronic Signature(s) Signed: 05/27/2023 2:33:21 PM By: Duanne Guess MD  DINESH, GASSETT ([id number]) [id number] Page 1 of 9 Visit Report for 05/27/2023 Chief Complaint Document Details Patient Name: Date of Service: CA RTA Ardean Larsen 05/27/2023 1:00 PM Medical Record Number: [id number] Patient Account Number: [id number] Date of Birth/Sex: Treating RN: Apr 23, 1991 (32 y.o. 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 [name] Primary Care Provider: [name], PRO [name] Other Clinician: Referring Provider: Treating Provider/Extender: [name] in Treatment: 0 Debridement Performed for Assessment: Wound #1 Left,Lateral Lower Leg Performed By: Physician Duanne Guess, MD The following information was scribed by: Brenton Grills The information was scribed for: Duanne Guess Debridement Type: Debridement Level of Consciousness (Pre-procedure): Awake and Alert Pre-procedure Verification/Time Out Yes - 13:30 Taken: Start Time: 13:31 Pain Control: Lidocaine 4% T opical Solution Percent of Wound Bed Debrided: 100% T Area Debrided (cm): otal 30.62 Tissue and other material debrided: Non-Viable, Slough, Subcutaneous, Slough Level: Skin/Subcutaneous Tissue Debridement Description: Excisional Instrument: Curette,  Nippers, Scissors Bleeding: Minimum Hemostasis Achieved: Pressure Response to Treatment: Procedure was tolerated well Level of Consciousness (Post- Awake and Alert procedure): Post Debridement Measurements of Total Wound Length: (cm) 6.5 Width: (cm) 6 Depth: (cm) 0.2 Volume: (cm) 6.126 Character of Wound/Ulcer Post Debridement: Improved Post Procedure Diagnosis [name] [id number] Page 2 of 9 Same as Pre-procedure Electronic Signature(s) Signed: 05/27/2023 4:02:03 PM By: [name] Signed: 05/28/2023 4:20:28 PM By: Brenton Grills Entered By: Brenton Grills on 05/27/2023 13:35:36 -------------------------------------------------------------------------------- HPI Details Patient Name: Date of Service: CA RTA [name] 05/27/2023 1:00 PM Medical Record Number: [id number] Patient Account Number: [id number] Date of Birth/Sex: Treating RN: 03/20/1991 (32 y.o. M) Primary Care Provider: [name], [name] Other Clinician: Referring Provider: Treating Provider/Extender: [name] in Treatment: 0 History of Present Illness HPI Description: ADMISSION 05/21/2023 This is a 32 year old man with end-stage renal disease on hemodialysis. In July of this year, he was seen by podiatry for "cellulitis with an open wound on his left foot and ankle.". He apparently was given clindamycin and mupirocin by urgent care prior to his visit with Dr. Annamary Rummage. At that time, the wound was felt to be improving on the prescribed regimen. About 3 weeks later, the wound was worsening. 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 The patient has presented to the emergency department in [facility] several times due to increased pain. He was apparently referred to plastic surgery from the ED. He saw  Dr. 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 According to the dialysis rounding note from Tuesday of this week, sodium thiosulfate is being empirically initiated. I still do not see that a biopsy has yet been performed. He is not currently taking Sensipar, as far as I can see. His intact PTH is actually not particularly

## 2023-06-02 ENCOUNTER — Other Ambulatory Visit: Payer: Self-pay | Admitting: Podiatry

## 2023-06-02 MED ORDER — OXYCODONE-ACETAMINOPHEN 5-325 MG PO TABS: 1.0000 | ORAL_TABLET | Freq: Four times a day (QID) | ORAL | 0 refills | Status: AC | PRN

## 2023-06-05 ENCOUNTER — Encounter (HOSPITAL_BASED_OUTPATIENT_CLINIC_OR_DEPARTMENT_OTHER): Payer: Medicare Other | Admitting: General Surgery

## 2023-06-05 DIAGNOSIS — L97322 Non-pressure chronic ulcer of left ankle with fat layer exposed: Secondary | ICD-10-CM | POA: Diagnosis not present

## 2023-06-08 NOTE — Progress Notes (Signed)
on 06/05/2023 09:37:56 -------------------------------------------------------------------------------- HxROS Details Patient Name: Date of Service: [name] 06/05/2023 8:30 A M Medical Record Number: [id number] Patient Account Number: [id number] Date of Birth/Sex: Treating RN: 1990/12/05 (32 y.o. M) Primary Care Provider: Jannetta Pineda ULT, [name] Pineda Other Clinician: Referring Provider: Treating Provider/Extender: [name] in Treatment: 2 Information Obtained From Chart Cardiovascular Medical History: Positive for: Hypertension Genitourinary Medical History: Positive for: End Stage Renal Disease Past Medical History  Notes: GI bleed, ulcer Psychiatric Medical History: Past Medical History Notes: anxiety, depression Immunizations Pneumococcal Vaccine: Received Pneumococcal Vaccination: No Implantable Devices No devices added [name], [name] ([id number]) [id number] Page 9 of 9 Hospitalization / Surgery History Type of Hospitalization/Surgery av fistula placement EGD Family and Social History Diabetes: Yes - Mother; Heart Disease: Yes - Mother; Hypertension: Yes - Mother; Kidney Disease: Yes - Siblings; Former smoker - ended on 03/09/2019; Alcohol Use: Never; Drug Use: No History; Caffeine Use: Never; Financial Concerns: No; Food, Clothing or Shelter Needs: No; Support System Lacking: No; Transportation Concerns: No Electronic Signature(s) Signed: 06/05/2023 9:53:37 AM By: Travis Guess MD FACS Entered By: [name] on 06/05/2023 09:30:11 -------------------------------------------------------------------------------- SuperBill Details Patient Name: Date of Service: [name] 06/05/2023 Medical Record Number: [id number] Patient Account Number: [id number] Date of Birth/Sex: Treating RN: 19-Aug-1991 (32 y.o. 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 End stage renal disease Z99.2 Dependence on renal dialysis Facility Procedures : CPT4 Code: [id number] Description: 97597 - DEBRIDE WOUND 1ST 20 SQ CM OR < ICD-10 Diagnosis Description L97.322 Non-pressure chronic ulcer of left ankle with fat layer exposed Modifier: Quantity: 1 Physician Procedures : CPT4 Code Description Modifier [id number] 99214 - WC PHYS LEVEL 4 - EST PT 25 ICD-10 Diagnosis Description L97.322 Non-pressure chronic ulcer of left ankle  with fat layer exposed N18.6 End stage renal disease Z99.2 Dependence on renal dialysis Quantity: 1 : [id number] 97597 - WC PHYS DEBR WO ANESTH 20 SQ CM ICD-10 Diagnosis Description L97.322 Non-pressure chronic ulcer of left ankle with fat layer exposed Quantity: 1 Electronic Signature(s) Signed: 06/05/2023 9:38:15 AM By: Travis Guess MD FACS Entered By: [name] on 06/05/2023 09:38:15  4x5 (in/in) (DME) (Generic) 1 x Per Day/30 Days ary Discharge Instructions: Apply to wound bed as instructed Secondary Dressing: Zetuvit Plus Silicone Border Heel Dressing10x10 (in/in) (DME) (Generic) 1 x Per Day/30 Days Discharge Instructions: Apply silicone border over primary dressing as directed. Secured With: Elastic Bandage 4 inch (ACE bandage) 1 x Per Day/30 Days Discharge Instructions: Secure with ACE bandage as directed. Secured With: [employer], 4.5x3.1 (in/yd) 1 x Per Day/30 Days Discharge Instructions: Secure with Kerlix as directed. Secured With: 64M Medipore H Soft Cloth Surgical T ape, 4 x 10 (in/yd) 1 x Per Day/30 Days Discharge Instructions: Secure with tape as directed. [name], [name] ([id number]) [id number] Page 4 of 9 Wound #2 - Ankle Wound Laterality: Left, Medial Cleanser: Soap and Water 1 x Per Day/30 Days Discharge Instructions: May shower and wash wound with dial antibacterial soap and water prior to dressing change. Cleanser: Vashe 5.8 (oz) (Generic) 1 x Per Day/30 Days Discharge Instructions: Cleanse the wound with Vashe prior to applying a clean dressing using gauze sponges, not tissue or cotton balls. Peri-Wound Care: Silver Sulfadiazine Cream 1%, 25 (g), tube (Generic) 1 x  Per Day/30 Days Prim Dressing: Hydrofera Blue Ready Transfer Foam, 4x5 (in/in) (DME) (Generic) 1 x Per Day/30 Days ary Discharge Instructions: Apply to wound bed as instructed Secondary Dressing: Zetuvit Plus Silicone Border Heel Dressing10x10 (in/in) (DME) (Generic) 1 x Per Day/30 Days Discharge Instructions: Apply silicone border over primary dressing as directed. Secured With: Elastic Bandage 4 inch (ACE bandage) 1 x Per Day/30 Days Discharge Instructions: Secure with ACE bandage as directed. Secured With: [employer], 4.5x3.1 (in/yd) 1 x Per Day/30 Days Discharge Instructions: Secure with Kerlix as directed. Secured With: 64M Medipore H Soft Cloth Surgical T ape, 4 x 10 (in/yd) 1 x Per Day/30 Days Discharge Instructions: Secure with tape as directed. Services and Therapies Venous Studies -Bilateral - [facility] Patient Medications llergies: No Known Allergies A Notifications Medication Indication Start End 06/05/2023 Silvadene DOSE topical 1 % cream - Apply thin layer to open areas of wounds with daily dressing changes. Electronic Signature(s) Signed: 06/05/2023 9:53:37 AM By: Travis Guess MD FACS Signed: 06/08/2023 2:03:22 PM By: [name] Previous Signature: 06/05/2023 9:33:09 AM Version By: Travis Guess MD FACS Entered By: [name] on 06/05/2023 09:35:48 Prescription 06/05/2023 -------------------------------------------------------------------------------- Travis Bene MD Patient Name: Provider: 01/06/1991 [id number] Date of Birth: NPI#: M [id number] Sex: DEA #: [phone] Phone #: License #: UPN: Patient Address: [address] Eligha Bridegroom [facility] [place] [address] , [address] [place], [place] [address] [phone] Allergies No Known Allergies Provider's Orders Crutches for Offloading - Please use two  crutches for offloading. Hand Signature: Date(s): CURTEZ, BRALLIER ([id number]) [id number] Page 5 of 9 Prescription 06/05/2023 Travis Bene MD Patient Name: Provider: 08/31/1991 [id number] Date of Birth: NPI#: Judie Petit [id number] Sex: DEA #: [phone] Phone #: License #: UPN: Patient Address: [address] Eligha Bridegroom [facility] [place] [address] , [address] Floor [place], [place] [address] [phone] Allergies No Known Allergies Provider's Orders Venous Studies -Bilateral - [facility] for [facility] venous reflux studies Hand Signature: Date(s): Electronic Signature(s) Signed: 06/05/2023 9:39:07 AM By: Travis Guess MD FACS Entered By: [name] on 06/05/2023 09:39:07 -------------------------------------------------------------------------------- Problem List Details Patient Name: Date of Service: CA RTA [name]  4x5 (in/in) (DME) (Generic) 1 x Per Day/30 Days ary Discharge Instructions: Apply to wound bed as instructed Secondary Dressing: Zetuvit Plus Silicone Border Heel Dressing10x10 (in/in) (DME) (Generic) 1 x Per Day/30 Days Discharge Instructions: Apply silicone border over primary dressing as directed. Secured With: Elastic Bandage 4 inch (ACE bandage) 1 x Per Day/30 Days Discharge Instructions: Secure with ACE bandage as directed. Secured With: [employer], 4.5x3.1 (in/yd) 1 x Per Day/30 Days Discharge Instructions: Secure with Kerlix as directed. Secured With: 64M Medipore H Soft Cloth Surgical T ape, 4 x 10 (in/yd) 1 x Per Day/30 Days Discharge Instructions: Secure with tape as directed. [name], [name] ([id number]) [id number] Page 4 of 9 Wound #2 - Ankle Wound Laterality: Left, Medial Cleanser: Soap and Water 1 x Per Day/30 Days Discharge Instructions: May shower and wash wound with dial antibacterial soap and water prior to dressing change. Cleanser: Vashe 5.8 (oz) (Generic) 1 x Per Day/30 Days Discharge Instructions: Cleanse the wound with Vashe prior to applying a clean dressing using gauze sponges, not tissue or cotton balls. Peri-Wound Care: Silver Sulfadiazine Cream 1%, 25 (g), tube (Generic) 1 x  Per Day/30 Days Prim Dressing: Hydrofera Blue Ready Transfer Foam, 4x5 (in/in) (DME) (Generic) 1 x Per Day/30 Days ary Discharge Instructions: Apply to wound bed as instructed Secondary Dressing: Zetuvit Plus Silicone Border Heel Dressing10x10 (in/in) (DME) (Generic) 1 x Per Day/30 Days Discharge Instructions: Apply silicone border over primary dressing as directed. Secured With: Elastic Bandage 4 inch (ACE bandage) 1 x Per Day/30 Days Discharge Instructions: Secure with ACE bandage as directed. Secured With: [employer], 4.5x3.1 (in/yd) 1 x Per Day/30 Days Discharge Instructions: Secure with Kerlix as directed. Secured With: 64M Medipore H Soft Cloth Surgical T ape, 4 x 10 (in/yd) 1 x Per Day/30 Days Discharge Instructions: Secure with tape as directed. Services and Therapies Venous Studies -Bilateral - [facility] for bilateral venous reflux studies Patient Medications llergies: No Known Allergies A Notifications Medication Indication Start End 06/05/2023 Silvadene DOSE topical 1 % cream - Apply thin layer to open areas of wounds with daily dressing changes. Electronic Signature(s) Signed: 06/05/2023 9:53:37 AM By: Travis Guess MD FACS Signed: 06/08/2023 2:03:22 PM By: [name] Previous Signature: 06/05/2023 9:33:09 AM Version By: Travis Guess MD FACS Entered By: [name] on 06/05/2023 09:35:48 Prescription 06/05/2023 -------------------------------------------------------------------------------- Travis Bene MD Patient Name: Provider: 01/06/1991 [id number] Date of Birth: NPI#: M [id number] Sex: DEA #: [phone] Phone #: License #: UPN: Patient Address: [address] Eligh[place] Bridegroom [facility] [place] [address] , [address] [place], [place] [address] [phone] Allergies No Known Allergies Provider's Orders Crutches for Offloading - Please use two  crutches for offloading. Hand Signature: Date(s): CURTEZ, BRALLIER ([id number]) [id number] Page 5 of 9 Prescription 06/05/2023 Travis Bene MD Patient Name: Provider: 08/31/1991 [id number] Date of Birth: NPI#: Judie Petit [id number] Sex: DEA #: [phone] Phone #: License #: UPN: Patient Address: [address] El[place] Bridegroom [facility] [place] [address] , [address] Floor [place], [place] [address] [phone] Allergies No Known Allergies Provider's Orders Venous Studies -Bilateral - [facility] for [facility] venous reflux studies Hand Signature: Date(s): Electronic Signature(s) Signed: 06/05/2023 9:39:07 AM By: Travis Guess MD FACS Entered By: [name] on 06/05/2023 09:39:07 -------------------------------------------------------------------------------- Problem List Details Patient Name: Date of Service: CA RTA [name]  on 06/05/2023 09:37:56 -------------------------------------------------------------------------------- HxROS Details Patient Name: Date of Service: [name] 06/05/2023 8:30 A M Medical Record Number: [id number] Patient Account Number: [id number] Date of Birth/Sex: Treating RN: 1990/12/05 (32 y.o. M) Primary Care Provider: Jannetta Pineda ULT, [name] Pineda Other Clinician: Referring Provider: Treating Provider/Extender: [name] in Treatment: 2 Information Obtained From Chart Cardiovascular Medical History: Positive for: Hypertension Genitourinary Medical History: Positive for: End Stage Renal Disease Past Medical History  Notes: GI bleed, ulcer Psychiatric Medical History: Past Medical History Notes: anxiety, depression Immunizations Pneumococcal Vaccine: Received Pneumococcal Vaccination: No Implantable Devices No devices added [name], [name] ([id number]) [id number] Page 9 of 9 Hospitalization / Surgery History Type of Hospitalization/Surgery av fistula placement EGD Family and Social History Diabetes: Yes - Mother; Heart Disease: Yes - Mother; Hypertension: Yes - Mother; Kidney Disease: Yes - Siblings; Former smoker - ended on 03/09/2019; Alcohol Use: Never; Drug Use: No History; Caffeine Use: Never; Financial Concerns: No; Food, Clothing or Shelter Needs: No; Support System Lacking: No; Transportation Concerns: No Electronic Signature(s) Signed: 06/05/2023 9:53:37 AM By: Travis Guess MD FACS Entered By: [name] on 06/05/2023 09:30:11 -------------------------------------------------------------------------------- SuperBill Details Patient Name: Date of Service: [name] 06/05/2023 Medical Record Number: [id number] Patient Account Number: [id number] Date of Birth/Sex: Treating RN: 19-Aug-1991 (32 y.o. 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 End stage renal disease Z99.2 Dependence on renal dialysis Facility Procedures : CPT4 Code: [id number] Description: 97597 - DEBRIDE WOUND 1ST 20 SQ CM OR < ICD-10 Diagnosis Description L97.322 Non-pressure chronic ulcer of left ankle with fat layer exposed Modifier: Quantity: 1 Physician Procedures : CPT4 Code Description Modifier [id number] 99214 - WC PHYS LEVEL 4 - EST PT 25 ICD-10 Diagnosis Description L97.322 Non-pressure chronic ulcer of left ankle  with fat layer exposed N18.6 End stage renal disease Z99.2 Dependence on renal dialysis Quantity: 1 : [id number] 97597 - WC PHYS DEBR WO ANESTH 20 SQ CM ICD-10 Diagnosis Description L97.322 Non-pressure chronic ulcer of left ankle with fat layer exposed Quantity: 1 Electronic Signature(s) Signed: 06/05/2023 9:38:15 AM By: Travis Guess MD FACS Entered By: [name] on 06/05/2023 09:38:15  on 06/05/2023 09:37:56 -------------------------------------------------------------------------------- HxROS Details Patient Name: Date of Service: [name] 06/05/2023 8:30 A M Medical Record Number: [id number] Patient Account Number: [id number] Date of Birth/Sex: Treating RN: 1990/12/05 (32 y.o. M) Primary Care Provider: Jannetta Pineda ULT, [name] Pineda Other Clinician: Referring Provider: Treating Provider/Extender: [name] in Treatment: 2 Information Obtained From Chart Cardiovascular Medical History: Positive for: Hypertension Genitourinary Medical History: Positive for: End Stage Renal Disease Past Medical History  Notes: GI bleed, ulcer Psychiatric Medical History: Past Medical History Notes: anxiety, depression Immunizations Pneumococcal Vaccine: Received Pneumococcal Vaccination: No Implantable Devices No devices added [name], [name] ([id number]) [id number] Page 9 of 9 Hospitalization / Surgery History Type of Hospitalization/Surgery av fistula placement EGD Family and Social History Diabetes: Yes - Mother; Heart Disease: Yes - Mother; Hypertension: Yes - Mother; Kidney Disease: Yes - Siblings; Former smoker - ended on 03/09/2019; Alcohol Use: Never; Drug Use: No History; Caffeine Use: Never; Financial Concerns: No; Food, Clothing or Shelter Needs: No; Support System Lacking: No; Transportation Concerns: No Electronic Signature(s) Signed: 06/05/2023 9:53:37 AM By: Travis Guess MD FACS Entered By: [name] on 06/05/2023 09:30:11 -------------------------------------------------------------------------------- SuperBill Details Patient Name: Date of Service: [name] 06/05/2023 Medical Record Number: [id number] Patient Account Number: [id number] Date of Birth/Sex: Treating RN: 19-Aug-1991 (32 y.o. 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 End stage renal disease Z99.2 Dependence on renal dialysis Facility Procedures : CPT4 Code: [id number] Description: 97597 - DEBRIDE WOUND 1ST 20 SQ CM OR < ICD-10 Diagnosis Description L97.322 Non-pressure chronic ulcer of left ankle with fat layer exposed Modifier: Quantity: 1 Physician Procedures : CPT4 Code Description Modifier [id number] 99214 - WC PHYS LEVEL 4 - EST PT 25 ICD-10 Diagnosis Description L97.322 Non-pressure chronic ulcer of left ankle  with fat layer exposed N18.6 End stage renal disease Z99.2 Dependence on renal dialysis Quantity: 1 : [id number] 97597 - WC PHYS DEBR WO ANESTH 20 SQ CM ICD-10 Diagnosis Description L97.322 Non-pressure chronic ulcer of left ankle with fat layer exposed Quantity: 1 Electronic Signature(s) Signed: 06/05/2023 9:38:15 AM By: Travis Guess MD FACS Entered By: [name] on 06/05/2023 09:38:15  4x5 (in/in) (DME) (Generic) 1 x Per Day/30 Days ary Discharge Instructions: Apply to wound bed as instructed Secondary Dressing: Zetuvit Plus Silicone Border Heel Dressing10x10 (in/in) (DME) (Generic) 1 x Per Day/30 Days Discharge Instructions: Apply silicone border over primary dressing as directed. Secured With: Elastic Bandage 4 inch (ACE bandage) 1 x Per Day/30 Days Discharge Instructions: Secure with ACE bandage as directed. Secured With: [employer], 4.5x3.1 (in/yd) 1 x Per Day/30 Days Discharge Instructions: Secure with Kerlix as directed. Secured With: 64M Medipore H Soft Cloth Surgical T ape, 4 x 10 (in/yd) 1 x Per Day/30 Days Discharge Instructions: Secure with tape as directed. [name], [name] ([id number]) [id number] Page 4 of 9 Wound #2 - Ankle Wound Laterality: Left, Medial Cleanser: Soap and Water 1 x Per Day/30 Days Discharge Instructions: May shower and wash wound with dial antibacterial soap and water prior to dressing change. Cleanser: Vashe 5.8 (oz) (Generic) 1 x Per Day/30 Days Discharge Instructions: Cleanse the wound with Vashe prior to applying a clean dressing using gauze sponges, not tissue or cotton balls. Peri-Wound Care: Silver Sulfadiazine Cream 1%, 25 (g), tube (Generic) 1 x  Per Day/30 Days Prim Dressing: Hydrofera Blue Ready Transfer Foam, 4x5 (in/in) (DME) (Generic) 1 x Per Day/30 Days ary Discharge Instructions: Apply to wound bed as instructed Secondary Dressing: Zetuvit Plus Silicone Border Heel Dressing10x10 (in/in) (DME) (Generic) 1 x Per Day/30 Days Discharge Instructions: Apply silicone border over primary dressing as directed. Secured With: Elastic Bandage 4 inch (ACE bandage) 1 x Per Day/30 Days Discharge Instructions: Secure with ACE bandage as directed. Secured With: [employer], 4.5x3.1 (in/yd) 1 x Per Day/30 Days Discharge Instructions: Secure with Kerlix as directed. Secured With: 64M Medipore H Soft Cloth Surgical T ape, 4 x 10 (in/yd) 1 x Per Day/30 Days Discharge Instructions: Secure with tape as directed. Services and Therapies Venous Studies -Bilateral - [facility] studies Patient Medications llergies: No Known Allergies A Notifications Medication Indication Start End 06/05/2023 Silvadene DOSE topical 1 % cream - Apply thin layer to open areas of wounds with daily dressing changes. Electronic Signature(s) Signed: 06/05/2023 9:53:37 AM By: Travis Guess MD FACS Signed: 06/08/2023 2:03:22 PM By: [name] Previous Signature: 06/05/2023 9:33:09 AM Version By: Travis Guess MD FACS Entered By: [name] on 06/05/2023 09:35:48 Prescription 06/05/2023 -------------------------------------------------------------------------------- Travis Bene MD Patient Name: Provider: 01/06/1991 [id number] Date of Birth: NPI#: M [id number] Sex: DEA #: [phone] Phone #: License #: UPN: Patient Address: [address] [place] Bridegroom [facility] [place] [address] , [address] [place], [place] [address] [phone] Allergies No Known Allergies Provider's Orders Crutches for Offloading - Please use two  crutches for offloading. Hand Signature: Date(s): CURTEZ, BRALLIER ([id number]) [id number] Page 5 of 9 Prescription 06/05/2023 Travis Bene MD Patient Name: Provider: 08/31/1991 [id number] Date of Birth: NPI#: Judie Petit [id number] Sex: DEA #: [phone] Phone #: License #: UPN: Patient Address: [address] El[place]a Bridegroom [facility] [place] [address] , [address] [place], [place] [address] [phone] Allergies No Known Allergies Provider's Orders Venous Studies -Bilateral - [facility] venous reflux studies Hand Signature: Date(s): Electronic Signature(s) Signed: 06/05/2023 9:39:07 AM By: Travis Guess MD FACS Entered By: [name] on 06/05/2023 09:39:07 -------------------------------------------------------------------------------- Problem List Details Patient Name: Date of Service: CA RTA [name]  on 06/05/2023 09:37:56 -------------------------------------------------------------------------------- HxROS Details Patient Name: Date of Service: [name] 06/05/2023 8:30 A M Medical Record Number: [id number] Patient Account Number: [id number] Date of Birth/Sex: Treating RN: 1990/12/05 (32 y.o. M) Primary Care Provider: Jannetta Pineda ULT, [name] Pineda Other Clinician: Referring Provider: Treating Provider/Extender: [name] in Treatment: 2 Information Obtained From Chart Cardiovascular Medical History: Positive for: Hypertension Genitourinary Medical History: Positive for: End Stage Renal Disease Past Medical History  Notes: GI bleed, ulcer Psychiatric Medical History: Past Medical History Notes: anxiety, depression Immunizations Pneumococcal Vaccine: Received Pneumococcal Vaccination: No Implantable Devices No devices added [name], [name] ([id number]) [id number] Page 9 of 9 Hospitalization / Surgery History Type of Hospitalization/Surgery av fistula placement EGD Family and Social History Diabetes: Yes - Mother; Heart Disease: Yes - Mother; Hypertension: Yes - Mother; Kidney Disease: Yes - Siblings; Former smoker - ended on 03/09/2019; Alcohol Use: Never; Drug Use: No History; Caffeine Use: Never; Financial Concerns: No; Food, Clothing or Shelter Needs: No; Support System Lacking: No; Transportation Concerns: No Electronic Signature(s) Signed: 06/05/2023 9:53:37 AM By: Travis Guess MD FACS Entered By: [name] on 06/05/2023 09:30:11 -------------------------------------------------------------------------------- SuperBill Details Patient Name: Date of Service: [name] 06/05/2023 Medical Record Number: [id number] Patient Account Number: [id number] Date of Birth/Sex: Treating RN: 19-Aug-1991 (32 y.o. 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 End stage renal disease Z99.2 Dependence on renal dialysis Facility Procedures : CPT4 Code: [id number] Description: 97597 - DEBRIDE WOUND 1ST 20 SQ CM OR < ICD-10 Diagnosis Description L97.322 Non-pressure chronic ulcer of left ankle with fat layer exposed Modifier: Quantity: 1 Physician Procedures : CPT4 Code Description Modifier [id number] 99214 - WC PHYS LEVEL 4 - EST PT 25 ICD-10 Diagnosis Description L97.322 Non-pressure chronic ulcer of left ankle  with fat layer exposed N18.6 End stage renal disease Z99.2 Dependence on renal dialysis Quantity: 1 : [id number] - WC PHYS DEBR WO ANESTH 20 SQ CM ICD-10 Diagnosis Description L97.322 Non-pressure chronic ulcer of left ankle with fat layer exposed Quantity: 1 Electronic Signature(s) Signed: 06/05/2023 9:38:15 AM By: Travis Guess MD FACS Entered By: [name] on 06/05/2023 09:38:15

## 2023-06-10 NOTE — Progress Notes (Signed)
History: Disease Disease 03/09/2023 03/09/2023 N/A Date Acquired: 2 2 N/A Weeks of Treatment: Open Open N/A Wound Status: No No N/A Wound Recurrence: 6x7x0.3 1x1x0.1 N/A Measurements L x W x D (cm) 32.987 0.785 N/A A (cm) : rea 9.896 0.079 N/A Volume (cm) : 20.00% 0.00% N/A % Reduction in A rea: -20.00% 0.00% N/A % Reduction in Volume: Full Thickness Without Exposed Full Thickness Without Exposed N/A Classification: Support Structures Support Structures Medium Medium N/A Exudate A mount: Serosanguineous Serosanguineous N/A Exudate Type: red, brown red, brown N/A Exudate Color: Distinct, outline attached Distinct, outline attached N/A Wound Margin: Medium (34-66%) Medium (34-66%) N/A Granulation A mount: Red, Pink Red, Pink N/A Granulation Quality: Medium (34-66%) Medium (34-66%) N/A Necrotic A mount: Adherent Slough Eschar, Adherent Slough N/A Necrotic Tissue: Fat Layer (Subcutaneous Tissue): Yes Fat Layer (Subcutaneous Tissue): Yes N/A Exposed Structures: Debridement - Selective/Open Wound N/A N/A Debridement: Pre-procedure Verification/Time Out 09:07 N/A N/A Taken: Lidocaine 4% Topical  Solution N/A N/A Pain Control: Slough N/A N/A Tissue Debrided: Non-Viable Tissue N/A N/A Level: 32.97 N/A N/A Debridement A (sq cm): rea Curette N/A N/A Instrument: Minimum N/A N/A Bleeding: Pressure N/A N/A Hemostasis A chieved: Procedure was tolerated well N/A N/A Debridement Treatment Response: 6x7x0.3 N/A N/A Post Debridement Measurements L x W x D (cm) 9.896 N/A N/A Post Debridement Volume: (cm) No Abnormalities Noted No Abnormalities Noted N/A Periwound Skin Texture: Maceration: No N/A Periwound Skin Moisture: Dry/Scaly: No Hemosiderin Staining: Yes No Abnormalities Noted N/A Periwound Skin Color: N/A No Abnormality N/A Temperature: N/A Yes N/A Tenderness on Palpation: Debridement N/A N/A Procedures Performed: Treatment Notes Electronic Signature(s) Signed: 06/05/2023 9:27:05 AM By: [name] Entered By: Duanne Guess on 06/05/2023 06:27:05 -------------------------------------------------------------------------------- Multi-Disciplinary Care Plan Details Patient Name: Date of Service: CA RTA [name] 06/05/2023 8:30 A M Medical Record Number: [id number] Patient Account Number: [id number] Date of Birth/Sex: Treating RN: 02-18-1991 (32 y.o. [name] Primary Care Safiyyah Vasconez: [name], PRO [name] Other ClinicianJani Gravel ([id number]) [id number] Page 4 of 8 Referring Clarisa Danser: Treating Megin Consalvo/Extender: [name] in Treatment: 2 Active Inactive Wound/Skin Impairment Nursing Diagnoses: Knowledge deficit related to smoking impact on wound healing Goals: Patient/caregiver will verbalize understanding of skin care regimen Date Initiated: 05/21/2023 Target Resolution Date: 07/09/2023 Goal Status: Active Interventions: Assess patient/caregiver ability to obtain necessary supplies Assess patient/caregiver ability to perform ulcer/skin care regimen upon admission  and as needed Assess ulceration(s) every visit Provide education on ulcer and skin care Screen for HBO Treatment Activities: Skin care regimen initiated : 05/21/2023 Topical wound management initiated : 05/21/2023 Notes: Electronic Signature(s) Signed: 06/08/2023 2:03:22 PM By: Brenton Grills Entered By: Brenton Grills on 06/05/2023 05:54:43 -------------------------------------------------------------------------------- Pain Assessment Details Patient Name: Date of Service: CA RTA [name] 06/05/2023 8:30 A M Medical Record Number: [id number] Patient Account Number: [id number] Date of Birth/Sex: Treating RN: 1991/04/25 (32 y.o. M) Primary Care Woody Kronberg: [name], [name] Other Clinician: Referring Tocara Mennen: Treating Anel Creighton/Extender: [name] in Treatment: 2 Active Problems Location of Pain Severity and Description of Pain Patient Has Paino Yes Site Locations Rate the pain. Current Pain Level: 9 Pain Management and Medication TIMMEY, LAMBA ([id number]) [id number] Page 5 of 8 Current Pain Management: Electronic Signature(s) Signed: 06/10/2023 11:58:52 AM By: Karl Ito Entered By: Karl Ito on 06/05/2023 05:36:51 -------------------------------------------------------------------------------- Patient/Caregiver Education Details Patient Name: Date of Service: CA RTA Ardean Larsen 9/27/2024andnbsp8:30 A M Medical Record Number: [id number] Patient Account  History: Disease Disease 03/09/2023 03/09/2023 N/A Date Acquired: 2 2 N/A Weeks of Treatment: Open Open N/A Wound Status: No No N/A Wound Recurrence: 6x7x0.3 1x1x0.1 N/A Measurements L x W x D (cm) 32.987 0.785 N/A A (cm) : rea 9.896 0.079 N/A Volume (cm) : 20.00% 0.00% N/A % Reduction in A rea: -20.00% 0.00% N/A % Reduction in Volume: Full Thickness Without Exposed Full Thickness Without Exposed N/A Classification: Support Structures Support Structures Medium Medium N/A Exudate A mount: Serosanguineous Serosanguineous N/A Exudate Type: red, brown red, brown N/A Exudate Color: Distinct, outline attached Distinct, outline attached N/A Wound Margin: Medium (34-66%) Medium (34-66%) N/A Granulation A mount: Red, Pink Red, Pink N/A Granulation Quality: Medium (34-66%) Medium (34-66%) N/A Necrotic A mount: Adherent Slough Eschar, Adherent Slough N/A Necrotic Tissue: Fat Layer (Subcutaneous Tissue): Yes Fat Layer (Subcutaneous Tissue): Yes N/A Exposed Structures: Debridement - Selective/Open Wound N/A N/A Debridement: Pre-procedure Verification/Time Out 09:07 N/A N/A Taken: Lidocaine 4% Topical  Solution N/A N/A Pain Control: Slough N/A N/A Tissue Debrided: Non-Viable Tissue N/A N/A Level: 32.97 N/A N/A Debridement A (sq cm): rea Curette N/A N/A Instrument: Minimum N/A N/A Bleeding: Pressure N/A N/A Hemostasis A chieved: Procedure was tolerated well N/A N/A Debridement Treatment Response: 6x7x0.3 N/A N/A Post Debridement Measurements L x W x D (cm) 9.896 N/A N/A Post Debridement Volume: (cm) No Abnormalities Noted No Abnormalities Noted N/A Periwound Skin Texture: Maceration: No N/A Periwound Skin Moisture: Dry/Scaly: No Hemosiderin Staining: Yes No Abnormalities Noted N/A Periwound Skin Color: N/A No Abnormality N/A Temperature: N/A Yes N/A Tenderness on Palpation: Debridement N/A N/A Procedures Performed: Treatment Notes Electronic Signature(s) Signed: 06/05/2023 9:27:05 AM By: [name] Entered By: Duanne Guess on 06/05/2023 06:27:05 -------------------------------------------------------------------------------- Multi-Disciplinary Care Plan Details Patient Name: Date of Service: CA RTA [name] 06/05/2023 8:30 A M Medical Record Number: [id number] Patient Account Number: [id number] Date of Birth/Sex: Treating RN: 02-18-1991 (32 y.o. [name] Primary Care Safiyyah Vasconez: [name], PRO [name] Other ClinicianJani Gravel ([id number]) [id number] Page 4 of 8 Referring Clarisa Danser: Treating Megin Consalvo/Extender: [name] in Treatment: 2 Active Inactive Wound/Skin Impairment Nursing Diagnoses: Knowledge deficit related to smoking impact on wound healing Goals: Patient/caregiver will verbalize understanding of skin care regimen Date Initiated: 05/21/2023 Target Resolution Date: 07/09/2023 Goal Status: Active Interventions: Assess patient/caregiver ability to obtain necessary supplies Assess patient/caregiver ability to perform ulcer/skin care regimen upon admission  and as needed Assess ulceration(s) every visit Provide education on ulcer and skin care Screen for HBO Treatment Activities: Skin care regimen initiated : 05/21/2023 Topical wound management initiated : 05/21/2023 Notes: Electronic Signature(s) Signed: 06/08/2023 2:03:22 PM By: Brenton Grills Entered By: Brenton Grills on 06/05/2023 05:54:43 -------------------------------------------------------------------------------- Pain Assessment Details Patient Name: Date of Service: CA RTA [name] 06/05/2023 8:30 A M Medical Record Number: [id number] Patient Account Number: [id number] Date of Birth/Sex: Treating RN: 1991/04/25 (32 y.o. M) Primary Care Woody Kronberg: [name], [name] Other Clinician: Referring Tocara Mennen: Treating Anel Creighton/Extender: [name] in Treatment: 2 Active Problems Location of Pain Severity and Description of Pain Patient Has Paino Yes Site Locations Rate the pain. Current Pain Level: 9 Pain Management and Medication TIMMEY, LAMBA ([id number]) [id number] Page 5 of 8 Current Pain Management: Electronic Signature(s) Signed: 06/10/2023 11:58:52 AM By: Karl Ito Entered By: Karl Ito on 06/05/2023 05:36:51 -------------------------------------------------------------------------------- Patient/Caregiver Education Details Patient Name: Date of Service: CA RTA Ardean Larsen 9/27/2024andnbsp8:30 A M Medical Record Number: [id number] Patient Account  Number: [id number] Date of Birth/Gender: Treating RN: [birth year] (32 y.o. [name] Primary Care Physician: [name], [name] Other Clinician: Referring Physician: Treating Physician/Extender: [name] in Treatment: 2 Education Assessment Education Provided To: Patient Education Topics Provided Wound/Skin Impairment: Methods: Explain/Verbal Responses: State content correctly Electronic  Signature(s) Signed: 06/08/2023 2:03:22 PM By: Brenton Grills Entered By: Brenton Grills on 06/05/2023 05:55:13 -------------------------------------------------------------------------------- Wound Assessment Details Patient Name: Date of Service: CA RTA [name] 06/05/2023 8:30 A M Medical Record Number: [id number] Patient Account Number: [id number] Date of Birth/Sex: Treating RN: 1991/02/09 (32 y.o. M) Primary Care Kamali Sakata: [name], [name] Other Clinician: Referring Devany Aja: Treating Zyair Russi/Extender: [name] in Treatment: 2 Wound Status Wound Number: 1 Primary Etiology: Calciphylaxis Wound Location: Left, Lateral Lower Leg Wound Status: Open Wounding Event: Bite Comorbid History: Hypertension, End Stage Renal Disease Date Acquired: 03/09/2023 Weeks Of Treatment: 2 Clustered Wound: No Photos [name] [id number] Page 6 of 8 Wound Measurements Length: (cm) 6 Width: (cm) 7 Depth: (cm) 0.3 Area: (cm) 32.987 Volume: (cm) 9.896 % Reduction in Area: 20% % Reduction in Volume: -20% Wound Description Classification: Full Thickness Without Exposed Support Structures Wound Margin: Distinct, outline attached Exudate Amount: Medium Exudate Type: Serosanguineous Exudate Color: red, brown Foul Odor After Cleansing: No Slough/Fibrino Yes Wound Bed Granulation Amount: Medium (34-66%) Exposed Structure Granulation Quality: Red, Pink Fat Layer (Subcutaneous Tissue) Exposed: Yes Necrotic Amount: Medium (34-66%) Necrotic Quality: Adherent Slough Periwound Skin Texture Texture Color No Abnormalities Noted: Yes No Abnormalities Noted: No Hemosiderin Staining: Yes Moisture No Abnormalities Noted: No Dry / Scaly: No Maceration: No Treatment Notes Wound #1 (Lower Leg) Wound Laterality: Left, Lateral Cleanser Soap and Water Discharge Instruction: May shower and wash wound with dial  antibacterial soap and water prior to dressing change. Vashe 5.8 (oz) Discharge Instruction: Cleanse the wound with Vashe prior to applying a clean dressing using gauze sponges, not tissue or cotton balls. Peri-Wound Care Silver Sulfadiazine Cream 1%, 25 (g), tube Topical Primary Dressing Hydrofera Blue Ready Transfer Foam, 4x5 (in/in) Discharge Instruction: Apply to wound bed as instructed Secondary Dressing Zetuvit Plus Silicone Border Heel Dressing10x10 (in/in) Discharge Instruction: Apply silicone border over primary dressing as directed. Secured With Compression Wrap Compression Stockings Facilities manager) Signed: 06/10/2023 11:58:52 AM By: Lyndee Hensen, [name] ([id number]) [id number] Page 7 of 8 Entered By: Karl Ito on 06/05/2023 05:42:10 -------------------------------------------------------------------------------- Wound Assessment Details Patient Name: Date of Service: CA RTA Ardean Larsen 06/05/2023 8:30 A M Medical Record Number: [id number] Patient Account Number: [id number] Date of Birth/Sex: Treating RN: 01/14/1991 (32 y.o. M) Primary Care Mansi Tokar: [name], [name] Other Clinician: Referring River Ambrosio: Treating Haila Dena/Extender: [name] in Treatment: 2 Wound Status Wound Number: 2 Primary Etiology: Calciphylaxis Wound Location: Left, Medial Ankle Wound Status: Open Wounding Event: Bite Comorbid History: Hypertension, End Stage Renal Disease Date Acquired: 03/09/2023 Weeks Of Treatment: 2 Clustered Wound: No Photos Wound Measurements Length: (cm) 1 Width: (cm) 1 Depth: (cm) 0.1 Area: (cm) 0.785 Volume: (cm) 0.079 % Reduction in Area: 0% % Reduction in Volume: 0% Wound Description Classification: Full Thickness Without Exposed Support Wound Margin: Distinct, outline attached Exudate Amount: Medium Exudate Type: Serosanguineous Exudate Color: red,  brown Structures Foul Odor After Cleansing: No Slough/Fibrino Yes Wound Bed Granulation Amount: Medium (34-66%) Exposed Structure Granulation Quality: Red, Pink Fat Layer (Subcutaneous Tissue) Exposed: Yes Necrotic Amount: Medium (34-66%) Necrotic Quality: Eschar, Adherent Slough Periwound Skin  JESHUA, RANSFORD ([id number]) [id number] Page 1 of 8 Visit Report for 06/05/2023 Arrival Information Details Patient Name: Date of Service: CA RTA Ardean Larsen 06/05/2023 8:30 A M Medical Record Number: [id number] Patient Account Number: [id number] Date of Birth/Sex: Treating RN: [birth year] (32 y.o. M) Primary Care Jahan Friedlander: [name], [name] Other Clinician: Referring Jacobe Study: Treating Jaice Digioia/Extender: [name] in Treatment: 2 Visit Information History Since Last Visit Added or deleted any medications: No Patient Arrived: Ambulatory Any new allergies or adverse reactions: No Arrival Time: 08:33 Had a fall or experienced change in No Accompanied By: self activities of daily living that may affect Transfer Assistance: None risk of falls: Patient Identification Verified: Yes Signs or symptoms of abuse/neglect since last visito No Secondary Verification Process Completed: Yes Hospitalized since last visit: No Patient Requires Transmission-Based Precautions: No Implantable device outside of the clinic excluding No Patient Has Alerts: No cellular tissue based products placed in the center since last visit: Has Dressing in Place as Prescribed: Yes Pain Present Now: Yes Electronic Signature(s) Signed: 06/10/2023 11:58:52 AM By: Karl Ito Entered By: Karl Ito on 06/05/2023 05:35:57 -------------------------------------------------------------------------------- Encounter Discharge Information Details Patient Name: Date of Service: CA RTA [name] 06/05/2023 8:30 A M Medical Record Number: [id number] Patient Account Number: [id number] Date of Birth/Sex: Treating RN: Dec 03, 1990 (32 y.o. [name] Primary Care Arilynn Blakeney: [name], PRO [name] Other Clinician: Referring Mirna Sutcliffe: Treating Bosco Paparella/Extender: [name] in Treatment: 2 Encounter Discharge  Information Items Post Procedure Vitals Discharge Condition: Stable Temperature (F): 98.1 Ambulatory Status: Ambulatory Pulse (bpm): 78 Discharge Destination: Home Respiratory Rate (breaths/min): 18 Transportation: Private Auto Blood Pressure (mmHg): 118/72 Accompanied By: self Schedule Follow-up Appointment: Yes Clinical Summary of Care: Patient Declined Electronic Signature(s) Signed: 06/08/2023 2:03:22 PM By: Brenton Grills Entered By: Brenton Grills on 06/05/2023 06:31:45 [name] ([id number]) [id number] Page 2 of 8 -------------------------------------------------------------------------------- Lower Extremity Assessment Details Patient Name: Date of Service: CA RTA Ardean Larsen 06/05/2023 8:30 A M Medical Record Number: [id number] Patient Account Number: [id number] Date of Birth/Sex: Treating RN: 07/14/1991 (32 y.o. [name] Primary Care Waqas Bruhl: [name], PRO [name] Other Clinician: Referring Sheenah Dimitroff: Treating Tyona Nilsen/Extender: [name] in Treatment: 2 Edema Assessment Assessed: [Left: No] [Right: No] [Left: Edema] [Right: :] Calf Left: Right: Point of Measurement: From Medial Instep 38.8 cm Ankle Left: Right: Point of Measurement: From Medial Instep 25.4 cm Vascular Assessment Pulses: Dorsalis Pedis Palpable: [Left:Yes] Extremity colors, hair growth, and conditions: Hair Growth on Extremity: [Left:Yes] Temperature of Extremity: [Left:Warm] Dependent Rubor: [Left:No No] Toe Nail Assessment Left: Right: Thick: No Discolored: No Deformed: No Improper Length and Hygiene: No Electronic Signature(s) Signed: 06/08/2023 2:03:22 PM By: Brenton Grills Entered By: Brenton Grills on 06/05/2023 05:53:04 -------------------------------------------------------------------------------- Multi Wound Chart Details Patient Name: Date of Service: CA RTA [name] 06/05/2023 8:30 A M Medical  Record Number: [id number] Patient Account Number: [id number] Date of Birth/Sex: Treating RN: 1991/01/07 (32 y.o. M) Primary Care Aubriauna Riner: [name], [name] Other Clinician: Referring Javaughn Opdahl: Treating Arnola Crittendon/Extender: [name] in Treatment: 2 Vital Signs Height(in): 71 Pulse(bpm): 20 Weight(lbs): 184 Blood Pressure(mmHg): 196/100 Body Mass Index(BMI): 25.7 Temperature(F): 98.8 Respiratory Rate(breaths/min): 18 Cupps, Kerri ([id number]) [id number] Page 3 of 8 [1:Photos:] [N/A:N/A] Left, Lateral Lower Leg Left, Medial Ankle N/A Wound Location: Bite Bite N/A Wounding Event: Calciphylaxis Calciphylaxis N/A Primary Etiology: Hypertension, End Stage Renal Hypertension, End Stage Renal N/A Comorbid

## 2023-06-12 ENCOUNTER — Encounter (HOSPITAL_BASED_OUTPATIENT_CLINIC_OR_DEPARTMENT_OTHER): Payer: Medicare Other | Attending: General Surgery | Admitting: General Surgery

## 2023-06-12 DIAGNOSIS — Z992 Dependence on renal dialysis: Secondary | ICD-10-CM | POA: Insufficient documentation

## 2023-06-12 DIAGNOSIS — Z87891 Personal history of nicotine dependence: Secondary | ICD-10-CM | POA: Diagnosis not present

## 2023-06-12 DIAGNOSIS — L97322 Non-pressure chronic ulcer of left ankle with fat layer exposed: Secondary | ICD-10-CM | POA: Diagnosis present

## 2023-06-12 DIAGNOSIS — I120 Hypertensive chronic kidney disease with stage 5 chronic kidney disease or end stage renal disease: Secondary | ICD-10-CM | POA: Diagnosis not present

## 2023-06-12 DIAGNOSIS — N186 End stage renal disease: Secondary | ICD-10-CM | POA: Diagnosis not present

## 2023-06-12 NOTE — Progress Notes (Signed)
Allergies [place], [name] ([id number]) [id number] Page 5 of 9 Provider's Orders Crutches for Offloading - Please use two crutches for offloading. Hand Signature: Date(s): Electronic Signature(s) Signed: 06/12/2023 11:11:18 AM By: Travis Guess MD FACS Signed: 06/12/2023 12:41:20 PM By: Zenaida Deed RN, BSN Entered By: Zenaida Deed on 06/12/2023 09:20:45 -------------------------------------------------------------------------------- Problem List Details Patient Name: Date of Service: [name] 06/12/2023 8:45 A M Medical Record Number: [id number] Patient Account Number: [id number] Date of Birth/Sex: Treating RN: Jan 10, 1991 (32 y.o. [name] Primary Care Provider: [name], PRO [name] Other Clinician: Referring Provider: Treating Provider/Extender: [name] in Treatment: 3 Active Problems ICD-10 Encounter Code Description Active Date MDM Diagnosis L97.322 Non-pressure chronic ulcer of left ankle with fat layer exposed 05/21/2023 No Yes N18.6 End stage renal disease 05/21/2023 No Yes Z99.2 Dependence on renal dialysis 05/21/2023 No Yes Inactive  Problems Resolved Problems Electronic Signature(s) Signed: 06/12/2023 9:14:20 AM By: Travis Guess MD FACS Entered By: [name] on 06/12/2023 09:14:19 -------------------------------------------------------------------------------- Progress Note Details Patient Name: Date of Service: [name] 06/12/2023 8:45 A M Medical Record Number: [id number] Patient Account Number: [id number] Date of Birth/Sex: Treating RN: [birth year] (32 y.o. M) Primary Care Provider: [name], [name] Other Clinician: Referring Provider: Treating Provider/Extender: [name] in Treatment: [address] 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 In July of this year, he was seen by podiatry for "cellulitis with an open wound on his left foot and ankle.". He apparently was given clindamycin and mupirocin by urgent care prior to his visit with Dr. Annamary Rummage. At that time, the wound was felt to be improving on the prescribed regimen. About 3 weeks later, the wound was worsening. 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 The patient has presented to the emergency department in [facility] several times due to increased pain. He was apparently referred to plastic surgery from the ED. He saw Dr. 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 According to the dialysis rounding note from Tuesday of this week, sodium thiosulfate is being empirically  initiated. I still do not see that a biopsy has yet been performed. He is not currently taking Sensipar, as far as I can see. His intact PTH is actually not particularly high with his last value being 173 and his calcium phosphorus product only 55, although his phosphorus has only recently started to come under control with previous values in the 7-8 range. 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 05/27/2023: The wounds actually look better this week. There has been some epithelialization. The biopsy site has a fair amount of slough accumulation. The wounds are still quite painful. He reports that they have definitely started sodium thiosulfate at his dialysis sessions and he received a call from [facility] regarding the referral for parathyroidectomy, but he was unable to answer and will be calling them back. No results from the biopsy so far. 06/05/2023: The wounds continue to improve. There  HIKEEM, [name] ([id number]) [id number] Page 1 of 9 Visit Report for 06/12/2023 Chief Complaint Document Details Patient Name: Date of Service: [name] 06/12/2023 8:45 A M Medical Record Number: [id number] Patient Account Number: [id number] Date of Birth/Sex: Treating RN: 11/04/1990 (32 y.o. 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 [name] Primary Care Provider: [name], PRO [name] Other Clinician: Referring Provider: Treating Provider/Extender: [name] in Treatment: 3 Debridement Performed for Assessment: Wound #1 Left,Lateral Lower Leg Performed By: Physician Travis Guess, MD The following information was scribed by: Zenaida Deed The information was scribed for: [name] Debridement Type: Debridement Level of Consciousness (Pre-procedure): Awake and Alert Pre-procedure Verification/Time Out Yes - 09:05 Taken: Start Time: 09:06 Pain Control: Lidocaine 5% topical ointment Percent of Wound Bed Debrided: 80% T Area Debrided (cm): otal 16.7 Tissue and other material debrided: Non-Viable, Slough, Slough Level: Non-Viable Tissue Debridement Description: Selective/Open Wound Instrument: Curette Bleeding:  Minimum Hemostasis Achieved: Pressure Procedural Pain: 10 Post Procedural Pain: 9 Response to Treatment: Procedure was tolerated well Level of Consciousness (Post- Awake and Alert procedure): Post Debridement Measurements of Total Wound Length: (cm) 3.8 Width: (cm) 7 Depth: (cm) 0.2 Volume: (cm) 4.178 Character of Wound/Ulcer Post Debridement: Requires Further Debridement [name] ([id number]) [id number] Page 2 of 9 Post Procedure Diagnosis Same as Pre-procedure Electronic Signature(s) Signed: 06/12/2023 11:11:18 AM By: Travis Guess MD FACS Signed: 06/12/2023 12:41:20 PM By: Zenaida Deed RN, BSN Entered By: Zenaida Deed on 06/12/2023 09:11:30 -------------------------------------------------------------------------------- HPI Details Patient Name: Date of Service: [name] 06/12/2023 8:45 A M Medical Record Number: [id number] Patient Account Number: [id number] Date of Birth/Sex: Treating RN: October 09, 1990 (32 y.o. M) Primary Care Provider: [name], [name] Other Clinician: Referring Provider: Treating Provider/Extender: [name] in Treatment: 3 History of Present Illness HPI Description: ADMISSION 05/21/2023 This is a 32 year old man with end-stage renal disease on hemodialysis. In July of this year, he was seen by podiatry for "cellulitis with an open wound on his left foot and ankle.". He apparently was given clindamycin and mupirocin by urgent care prior to his visit with Dr. Annamary Rummage. At that time, the wound was felt to be improving on the prescribed regimen. About 3 weeks later, the wound was worsening. 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 The patient has presented to the emergency department in [facility] several times due to increased pain. He was  apparently referred to plastic surgery from the ED. He saw Dr. 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 According to the dialysis rounding note from Tuesday of this week, sodium thiosulfate is being empirically initiated. I still do not see that a biopsy has yet been performed. He is not currently taking Sensipar, as far  as I can see. His intact PTH is actually not particularly high with his last value being 173 and his calcium phosphorus product only 55, although his phosphorus has only recently started to come under control with previous values in the 7-8 range. 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 05/27/2023: The wounds actually look better this week. There has been some epithelialization. The biopsy site has a fair amount of slough accumulation. The wounds are still quite painful. He reports that they have definitely started sodium thiosulfate at his dialysis sessions and he received a call from [facility] regarding the referral for parathyroidectomy, but he was unable to answer and will be calling them back. No results from the biopsy so far. 06/05/2023: The wounds continue to improve. There is more epithelialization. There is slough buildup on the wound surfaces, particularly at the biopsy site. 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 The pattern correlated with stasis dermatitis or trauma. 06/12/2023: The lateral wound is looking quite a bit better this week. Multiple small pockmarks have healed. There is a fair amount of slough on the wound surface and the biopsy site still has quite a bit of depth to it. The medial ulcers are exquisitely painful and they appear to have opened up slightly, but they are clean without any evidence of infection.  He did meet with endocrine surgery at [facility], but given the negative biopsy for calciphylaxis, no surgical intervention was advised. He has not heard anything from vascular surgery regarding the scheduling of his venous reflux studies. Electronic Signature(s) Signed: 06/12/2023 9:17:20 AM By: Travis Guess MD FACS Entered By: [name] on 06/12/2023 09:17:20 -------------------------------------------------------------------------------- Physical Exam Details Patient Name: Date of Service: [name] 06/12/2023 8:45 A M Medical Record Number: [id number] Patient Account Number: [id number] Date of Birth/Sex: Treating RN: [birth year] (32 y.o. M) Primary Care Provider: [name], [name] Other Clinician: Referring Provider: Treating Provider/Extender: [name] in Treatment: 3 [place], [name] ([id number]) [id number] Page 3 of 9 Hypertensive, asymptomatic. . . . no acute distress. Respiratory Normal work of breathing on room air. Notes 06/12/2023: The lateral wound is looking quite a bit better this week. Multiple small pockmarks have healed. There is a fair amount of slough on the wound surface and the biopsy site still has quite a bit of depth to it. The medial ulcers are exquisitely painful and they appear to have opened up slightly, but they are clean without any evidence of infection. Electronic Signature(s) Signed: 06/12/2023 9:18:16 AM By: Travis Guess MD FACS Entered By: [name] on 06/12/2023 09:18:15 -------------------------------------------------------------------------------- Physician Orders Details Patient Name: Date of Service: [name] 06/12/2023 8:45 A M Medical Record Number: [id number] Patient Account Number: [id number] Date of Birth/Sex: Treating RN: 05-08-1991 (32 y.o. [name] Primary Care Provider: [name],  PRO [name] Other Clinician: Referring Provider: Treating Provider/Extender: [name] in Treatment: 3 The following information was scribed by: Zenaida Deed The information was scribed for: [name] Verbal / Phone Orders: No Diagnosis Coding ICD-10 Coding Code Description L97.322 Non-pressure chronic ulcer of left ankle with fat layer exposed N18.6 End stage renal disease Z99.2 Dependence on renal dialysis Follow-up Appointments ppointment in 1 week. - Dr Lady Gary RM 3 Return A Anesthetic Wound #1 Left,Lateral Lower Leg (In clinic) Topical Lidocaine 5% applied  HIKE[place], [name] ([id number]) [id number] Page 1 of 9 Visit Report for 06/12/2023 Chief Complaint Document Details Patient Name: Date of Service: [name] 06/12/2023 8:45 A M Medical Record Number: [id number] Patient Account Number: [id number] Date of Birth/Sex: Treating RN: 11/04/1990 (32 y.o. 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 [name] Primary Care Provider: [name], PRO [name] Other Clinician: Referring Provider: Treating Provider/Extender: [name] in Treatment: 3 Debridement Performed for Assessment: Wound #1 Left,Lateral Lower Leg Performed By: Physician Travis Guess, MD The following information was scribed by: Zenaida Deed The information was scribed for: [name] Debridement Type: Debridement Level of Consciousness (Pre-procedure): Awake and Alert Pre-procedure Verification/Time Out Yes - 09:05 Taken: Start Time: 09:06 Pain Control: Lidocaine 5% topical ointment Percent of Wound Bed Debrided: 80% T Area Debrided (cm): otal 16.7 Tissue and other material debrided: Non-Viable, Slough, Slough Level: Non-Viable Tissue Debridement Description: Selective/Open Wound Instrument: Curette Bleeding:  Minimum Hemostasis Achieved: Pressure Procedural Pain: 10 Post Procedural Pain: 9 Response to Treatment: Procedure was tolerated well Level of Consciousness (Post- Awake and Alert procedure): Post Debridement Measurements of Total Wound Length: (cm) 3.8 Width: (cm) 7 Depth: (cm) 0.2 Volume: (cm) 4.178 Character of Wound/Ulcer Post Debridement: Requires Further Debridement [name] ([id number]) [id number] Page 2 of 9 Post Procedure Diagnosis Same as Pre-procedure Electronic Signature(s) Signed: 06/12/2023 11:11:18 AM By: Travis Guess MD FACS Signed: 06/12/2023 12:41:20 PM By: Zenaida Deed RN, BSN Entered By: Zenaida Deed on 06/12/2023 09:11:30 -------------------------------------------------------------------------------- HPI Details Patient Name: Date of Service: [name] 06/12/2023 8:45 A M Medical Record Number: [id number] Patient Account Number: [id number] Date of Birth/Sex: Treating RN: October 09, 1990 (32 y.o. M) Primary Care Provider: [name], [name] Other Clinician: Referring Provider: Treating Provider/Extender: [name] in Treatment: 3 History of Present Illness HPI Description: ADMISSION 05/21/2023 This is a 32 year old man with end-stage renal disease on hemodialysis. In July of this year, he was seen by podiatry for "cellulitis with an open wound on his left foot and ankle.". He apparently was given clindamycin and mupirocin by urgent care prior to his visit with Dr. Annamary Rummage. At that time, the wound was felt to be improving on the prescribed regimen. About 3 weeks later, the wound was worsening. 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 The patient has presented to the emergency department in [facility] several times due to increased pain. He was  apparently referred to plastic surgery from the ED. He saw Dr. 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 According to the dialysis rounding note from Tuesday of this week, sodium thiosulfate is being empirically initiated. I still do not see that a biopsy has yet been performed. He is not currently taking Sensipar, as far  Prim Dressing: Hydrofera Blue Ready Transfer Foam, 4x5 (in/in) (Generic) 1 x Per Day/30 Days ary Discharge Instructions: Apply to wound bed as instructed Secondary Dressing: Zetuvit Plus Silicone Border Dressing 4x4 (in/in) (DME) (Dispense As Written) 1 x Per Day/30 Days Discharge Instructions: Apply silicone border over primary dressing as directed. Secured With: Elastic Bandage 4 inch (ACE bandage) 1 x Per Day/30 Days ANTOINE, VANDERMEULEN ([id number]) [id number] Page 8 of 9 Discharge Instructions: Secure with ACE bandage as directed. Secured With: [employer], 4.5x3.1 (in/yd) 1 x Per Day/30 Days Discharge Instructions: Secure with Kerlix as directed. Secured With: 42M Medipore H Soft Cloth Surgical T ape, 4 x 10 (in/yd) 1 x Per Day/30 Days Discharge Instructions: Secure with tape as directed. 06/12/2023: The lateral wound is looking quite a bit better this  week. Multiple small pockmarks have healed. There is a fair amount of slough on the wound surface and the biopsy site still has quite a bit of depth to it. The medial ulcers are exquisitely painful and they appear to have opened up slightly, but they are clean without any evidence of infection. He did meet with endocrine surgery at [facility], but given the negative biopsy for calciphylaxis, no surgical intervention was advised. He has not heard anything from vascular surgery regarding the scheduling of his venous reflux studies. I used a curette to debride slough from the lateral ulcer; he was in too much pain to permit debridement of the medial leg ulcer. He says he is still getting relief from the Silvadene so we will apply that along with Hydrofera Blue. We are using Kerlix and Ace bandaging; he has also been wearing a compression stocking over his bandaging. We will follow-up with vascular regarding the venous reflux studies. The patient will follow-up with me in a week. Electronic Signature(s) Signed: 06/12/2023 10:08:59 AM By: Travis Stall RN, BSN Signed: 06/12/2023 11:11:18 AM By: Travis Guess MD FACS Previous Signature: 06/12/2023 9:19:31 AM Version By: Travis Guess MD FACS Entered By: [name] on 06/12/2023 09:59:34 -------------------------------------------------------------------------------- HxROS Details Patient Name: Date of Service: [name] 06/12/2023 8:45 A M Medical Record Number: [id number] Patient Account Number: [id number] Date of Birth/Sex: Treating RN: 1990-11-28 (32 y.o. M) Primary Care Provider: [name], [name] Other Clinician: Referring Provider: Treating Provider/Extender: [name] in Treatment: 3 Information Obtained From Chart Cardiovascular Medical History: Positive for: Hypertension Genitourinary Medical History: Positive for: End Stage Renal Disease Past Medical  History Notes: GI bleed, ulcer Psychiatric Medical History: Past Medical History Notes: anxiety, depression Immunizations Pneumococcal Vaccine: Received Pneumococcal Vaccination: No Implantable Devices No devices added Hospitalization / Surgery History Type of Hospitalization/Surgery av fistula placement EGD Family and Social History Diabetes: Yes - Mother; Heart Disease: Yes - Mother; Hypertension: Yes - Mother; Kidney Disease: Yes - Siblings; Former smoker - ended on 03/09/2019; [place], [name] ([id number]) [id number] Page 9 of 9 Alcohol Use: Never; Drug Use: No History; Caffeine Use: Never; Financial Concerns: No; Food, Clothing or Shelter Needs: No; Support System Lacking: No; Transportation Concerns: No Electronic Signature(s) Signed: 06/12/2023 11:11:18 AM By: Travis Guess MD FACS Entered By: [name] on 06/12/2023 09:17:45 -------------------------------------------------------------------------------- SuperBill Details Patient Name: Date of Service: [name] 06/12/2023 Medical Record Number: [id number] Patient Account Number: [id number] Date of Birth/Sex: Treating RN: 1990/11/25 (32 y.o. M) Primary Care Provider: [name], [name] Other Clinician: Referring Provider: Treating Provider/Extender: [name] in Treatment: 3  as I can see. His intact PTH is actually not particularly high with his last value being 173 and his calcium phosphorus product only 55, although his phosphorus has only recently started to come under control with previous values in the 7-8 range. 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 05/27/2023: The wounds actually look better this week. There has been some epithelialization. The biopsy site has a fair amount of slough accumulation. The wounds are still quite painful. He reports that they have definitely started sodium thiosulfate at his dialysis sessions and he received a call from [facility] regarding the referral for parathyroidectomy, but he was unable to answer and will be calling them back. No results from the biopsy so far. 06/05/2023: The wounds continue to improve. There is more epithelialization. There is slough buildup on the wound surfaces, particularly at the biopsy site. 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 The pattern correlated with stasis dermatitis or trauma. 06/12/2023: The lateral wound is looking quite a bit better this week. Multiple small pockmarks have healed. There is a fair amount of slough on the wound surface and the biopsy site still has quite a bit of depth to it. The medial ulcers are exquisitely painful and they appear to have opened up slightly, but they are clean without any evidence of infection.  He did meet with endocrine surgery at [facility], but given the negative biopsy for calciphylaxis, no surgical intervention was advised. He has not heard anything from vascular surgery regarding the scheduling of his venous reflux studies. Electronic Signature(s) Signed: 06/12/2023 9:17:20 AM By: Travis Guess MD FACS Entered By: [name] on 06/12/2023 09:17:20 -------------------------------------------------------------------------------- Physical Exam Details Patient Name: Date of Service: [name] 06/12/2023 8:45 A M Medical Record Number: [id number] Patient Account Number: [id number] Date of Birth/Sex: Treating RN: [birth year] (32 y.o. M) Primary Care Provider: [name], [name] Other Clinician: Referring Provider: Treating Provider/Extender: [name] in Treatment: 3 [place], [name] ([id number]) [id number] Page 3 of 9 Hypertensive, asymptomatic. . . . no acute distress. Respiratory Normal work of breathing on room air. Notes 06/12/2023: The lateral wound is looking quite a bit better this week. Multiple small pockmarks have healed. There is a fair amount of slough on the wound surface and the biopsy site still has quite a bit of depth to it. The medial ulcers are exquisitely painful and they appear to have opened up slightly, but they are clean without any evidence of infection. Electronic Signature(s) Signed: 06/12/2023 9:18:16 AM By: Travis Guess MD FACS Entered By: [name] on 06/12/2023 09:18:15 -------------------------------------------------------------------------------- Physician Orders Details Patient Name: Date of Service: [name] 06/12/2023 8:45 A M Medical Record Number: [id number] Patient Account Number: [id number] Date of Birth/Sex: Treating RN: 05-08-1991 (32 y.o. [name] Primary Care Provider: [name],  PRO [name] Other Clinician: Referring Provider: Treating Provider/Extender: [name] in Treatment: 3 The following information was scribed by: Zenaida Deed The information was scribed for: [name] Verbal / Phone Orders: No Diagnosis Coding ICD-10 Coding Code Description L97.322 Non-pressure chronic ulcer of left ankle with fat layer exposed N18.6 End stage renal disease Z99.2 Dependence on renal dialysis Follow-up Appointments ppointment in 1 week. - Dr Lady Gary RM 3 Return A Anesthetic Wound #1 Left,Lateral Lower Leg (In clinic) Topical Lidocaine 5% applied  Allergies [place], [name] ([id number]) [id number] Page 5 of 9 Provider's Orders Crutches for Offloading - Please use two crutches for offloading. Hand Signature: Date(s): Electronic Signature(s) Signed: 06/12/2023 11:11:18 AM By: Travis Guess MD FACS Signed: 06/12/2023 12:41:20 PM By: Zenaida Deed RN, BSN Entered By: Zenaida Deed on 06/12/2023 09:20:45 -------------------------------------------------------------------------------- Problem List Details Patient Name: Date of Service: [name] 06/12/2023 8:45 A M Medical Record Number: [id number] Patient Account Number: [id number] Date of Birth/Sex: Treating RN: Jan 10, 1991 (32 y.o. [name] Primary Care Provider: [name], PRO [name] Other Clinician: Referring Provider: Treating Provider/Extender: [name] in Treatment: 3 Active Problems ICD-10 Encounter Code Description Active Date MDM Diagnosis L97.322 Non-pressure chronic ulcer of left ankle with fat layer exposed 05/21/2023 No Yes N18.6 End stage renal disease 05/21/2023 No Yes Z99.2 Dependence on renal dialysis 05/21/2023 No Yes Inactive  Problems Resolved Problems Electronic Signature(s) Signed: 06/12/2023 9:14:20 AM By: Travis Guess MD FACS Entered By: [name] on 06/12/2023 09:14:19 -------------------------------------------------------------------------------- Progress Note Details Patient Name: Date of Service: [name] 06/12/2023 8:45 A M Medical Record Number: [id number] Patient Account Number: [id number] Date of Birth/Sex: Treating RN: [birth year] (32 y.o. M) Primary Care Provider: [name], [name] Other Clinician: Referring Provider: Treating Provider/Extender: [name] in Treatment: [address] 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 In July of this year, he was seen by podiatry for "cellulitis with an open wound on his left foot and ankle.". He apparently was given clindamycin and mupirocin by urgent care prior to his visit with Dr. Annamary Rummage. At that time, the wound was felt to be improving on the prescribed regimen. About 3 weeks later, the wound was worsening. 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 The patient has presented to the emergency department in [facility] several times due to increased pain. He was apparently referred to plastic surgery from the ED. He saw Dr. 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 According to the dialysis rounding note from Tuesday of this week, sodium thiosulfate is being empirically  initiated. I still do not see that a biopsy has yet been performed. He is not currently taking Sensipar, as far as I can see. His intact PTH is actually not particularly high with his last value being 173 and his calcium phosphorus product only 55, although his phosphorus has only recently started to come under control with previous values in the 7-8 range. 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 05/27/2023: The wounds actually look better this week. There has been some epithelialization. The biopsy site has a fair amount of slough accumulation. The wounds are still quite painful. He reports that they have definitely started sodium thiosulfate at his dialysis sessions and he received a call from [facility] regarding the referral for parathyroidectomy, but he was unable to answer and will be calling them back. No results from the biopsy so far. 06/05/2023: The wounds continue to improve. There  as I can see. His intact PTH is actually not particularly high with his last value being 173 and his calcium phosphorus product only 55, although his phosphorus has only recently started to come under control with previous values in the 7-8 range. 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 05/27/2023: The wounds actually look better this week. There has been some epithelialization. The biopsy site has a fair amount of slough accumulation. The wounds are still quite painful. He reports that they have definitely started sodium thiosulfate at his dialysis sessions and he received a call from [facility] regarding the referral for parathyroidectomy, but he was unable to answer and will be calling them back. No results from the biopsy so far. 06/05/2023: The wounds continue to improve. There is more epithelialization. There is slough buildup on the wound surfaces, particularly at the biopsy site. 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 The pattern correlated with stasis dermatitis or trauma. 06/12/2023: The lateral wound is looking quite a bit better this week. Multiple small pockmarks have healed. There is a fair amount of slough on the wound surface and the biopsy site still has quite a bit of depth to it. The medial ulcers are exquisitely painful and they appear to have opened up slightly, but they are clean without any evidence of infection.  He did meet with endocrine surgery at [facility], but given the negative biopsy for calciphylaxis, no surgical intervention was advised. He has not heard anything from vascular surgery regarding the scheduling of his venous reflux studies. Electronic Signature(s) Signed: 06/12/2023 9:17:20 AM By: Travis Guess MD FACS Entered By: [name] on 06/12/2023 09:17:20 -------------------------------------------------------------------------------- Physical Exam Details Patient Name: Date of Service: [name] 06/12/2023 8:45 A M Medical Record Number: [id number] Patient Account Number: [id number] Date of Birth/Sex: Treating RN: [birth year] (32 y.o. M) Primary Care Provider: [name], [name] Other Clinician: Referring Provider: Treating Provider/Extender: [name] in Treatment: 3 [place], [name] ([id number]) [id number] Page 3 of 9 Hypertensive, asymptomatic. . . . no acute distress. Respiratory Normal work of breathing on room air. Notes 06/12/2023: The lateral wound is looking quite a bit better this week. Multiple small pockmarks have healed. There is a fair amount of slough on the wound surface and the biopsy site still has quite a bit of depth to it. The medial ulcers are exquisitely painful and they appear to have opened up slightly, but they are clean without any evidence of infection. Electronic Signature(s) Signed: 06/12/2023 9:18:16 AM By: Travis Guess MD FACS Entered By: [name] on 06/12/2023 09:18:15 -------------------------------------------------------------------------------- Physician Orders Details Patient Name: Date of Service: [name] 06/12/2023 8:45 A M Medical Record Number: [id number] Patient Account Number: [id number] Date of Birth/Sex: Treating RN: 05-08-1991 (32 y.o. [name] Primary Care Provider: [name],  PRO [name] Other Clinician: Referring Provider: Treating Provider/Extender: [name] in Treatment: 3 The following information was scribed by: Zenaida Deed The information was scribed for: [name] Verbal / Phone Orders: No Diagnosis Coding ICD-10 Coding Code Description L97.322 Non-pressure chronic ulcer of left ankle with fat layer exposed N18.6 End stage renal disease Z99.2 Dependence on renal dialysis Follow-up Appointments ppointment in 1 week. - Dr Lady Gary RM 3 Return A Anesthetic Wound #1 Left,Lateral Lower Leg (In clinic) Topical Lidocaine 5% applied

## 2023-06-12 NOTE — Progress Notes (Signed)
Yes Moisture Temperature / Pain No Abnormalities Noted: Yes Temperature: No Abnormality Tenderness on Palpation: Yes Electronic Signature(s) Signed: 06/12/2023 12:41:20 PM By: Zenaida Deed RN, BSN Entered By: Zenaida Deed on 06/12/2023 08:56:51 -------------------------------------------------------------------------------- Vitals Details Patient Name: Date of Service: [name] 06/12/2023 8:45 A M Medical Record Number: [id number] Patient Account Number: [id number] Date of Birth/Sex: Treating RN: 1990-10-12 (32 y.o. [name] Primary Care [name]: [name], PRO [name] Other Clinician: Referring Sayda Grable: Treating Chibueze Beasley/Extender: [name] in Treatment: 3 Vital Signs Time Taken: 08:43 Temperature (F): 97.6 Height (in): 71 Pulse (bpm): 79 Weight (lbs): 184 Respiratory Rate (breaths/min): 18 Body Mass Index (BMI): 25.7 Blood Pressure (mmHg): 155/93 Reference Range: 80 - 120 mg / dl Electronic Signature(s) Signed: 06/12/2023 12:41:20 PM By: Zenaida Deed RN, BSN Entered By: Zenaida Deed on 06/12/2023 08:44:21 [name] ([id number]) [id number] Page 8 of 8  [name], [name] ([id number]) [id number] Page 1 of 8 Visit Report for 06/12/2023 Arrival Information Details Patient Name: Date of Service: [name] 06/12/2023 8:45 A M Medical Record Number: [id number] Patient Account Number: [id number] Date of Birth/Sex: Treating RN: 1991/07/29 (32 y.o. [name], [name] Primary Care Tonimarie Gritz: [name], PRO [name] Other Clinician: Referring Patrik Turnbaugh: Treating Arely Tinner/Extender: [name] in Treatment: 3 Visit Information History Since Last Visit Added or deleted any medications: No Patient Arrived: Ambulatory Any new allergies or adverse reactions: No Arrival Time: 08:36 Had a fall or experienced change in No Accompanied By: self activities of daily living that may affect Transfer Assistance: None risk of falls: Patient Identification Verified: Yes Signs or symptoms of abuse/neglect since last visito No Secondary Verification Process Completed: Yes Hospitalized since last visit: No Patient Requires Transmission-Based Precautions: No Implantable device outside of the clinic excluding No Patient Has Alerts: No cellular tissue based products placed in the center since last visit: Has Dressing in Place as Prescribed: Yes Has Compression in Place as Prescribed: Yes Pain Present Now: Yes Electronic Signature(s) Signed: 06/12/2023 12:41:20 PM By: Zenaida Deed RN, BSN Entered By: Zenaida Deed on 06/12/2023 08:43:15 -------------------------------------------------------------------------------- Lower Extremity Assessment Details Patient Name: Date of Service: [name] 06/12/2023 8:45 A M Medical Record Number: [id number] Patient Account Number: [id number] Date of Birth/Sex: Treating RN: [birth year] (32 y.o. [name] Primary Care Klair Leising: [name], PRO [name] Other Clinician: Referring Traeger Sultana: Treating Kona Lover/Extender: [name] in Treatment: 3 Edema Assessment Assessed: [Left: No] [Right: No] Edema: [Left: Ye] [Right: s] Calf Left: Right: Point of Measurement: From Medial Instep 36.5 cm Ankle Left: Right: Point of Measurement: From Medial Instep 24 cm Vascular Assessment Pulses: Dorsalis Pedis Palpable: [Left:Yes] Pineda, Travis ([id number]) [Right:[id number] Page 2 of 8] Extremity colors, hair growth, and conditions: Extremity Color: [Left:Hyperpigmented] Hair Growth on Extremity: [Left:Yes] Temperature of Extremity: [Left:Warm] Dependent Rubor: [Left:No] Blanched when Elevated: [Left:No No] Electronic Signature(s) Signed: 06/12/2023 12:41:20 PM By: Zenaida Deed RN, BSN Entered By: Zenaida Deed on 06/12/2023 08:51:18 -------------------------------------------------------------------------------- Multi Wound Chart Details Patient Name: Date of Service: [name] 06/12/2023 8:45 A M Medical Record Number: [id number] Patient Account Number: [id number] Date of Birth/Sex: Treating RN: [birth year] (32 y.o. M) Primary Care Mairyn Lenahan: [name], [name] Other Clinician: Referring [name]: Treating [name]/Extender: [name] in Treatment: 3 Vital Signs Height(in): 71 Pulse(bpm): 79 Weight(lbs): 184 Blood Pressure(mmHg): 155/93 Body Mass Index(BMI): 25.7 Temperature(F): 97.6 Respiratory Rate(breaths/min): 18 [1:Photos:] [Pineda/A:Pineda/A] Left, Lateral Lower Leg Left, Medial Ankle Pineda/A Wound Location: Bite Bite Pineda/A Wounding Event: Calciphylaxis Calciphylaxis Pineda/A Primary Etiology: Hypertension, End Stage Renal Hypertension, End Stage Renal Pineda/A Comorbid History: Disease Disease 03/09/2023 03/09/2023 Pineda/A Date Acquired: 3 3 Pineda/A Weeks of Treatment: Open Open Pineda/A Wound Status: No No Pineda/A Wound Recurrence: 3.8x7x0.2 0.8x2.7x0.1 Pineda/A Measurements L x W x D (cm) 20.892 1.696 Pineda/A A (cm) : rea 4.178 0.17  Pineda/A Volume (cm) : 49.30% -116.10% Pineda/A % Reduction in A rea: 49.30% -115.20% Pineda/A % Reduction in Volume: Full Thickness Without Exposed Full Thickness Without Exposed Pineda/A Classification: Support Structures Support Structures Medium Small Pineda/A Exudate A mount: Serosanguineous Serosanguineous Pineda/A Exudate Type: red, brown red, brown Pineda/A Exudate Color: Distinct, outline attached Distinct, outline attached Pineda/A Wound Margin: Small (1-33%) Medium (34-66%) Pineda/A Granulation A mount: Red, Pink Red, Pink Pineda/A Granulation Quality: Large (67-100%) Medium (34-66%) Pineda/A Necrotic A mount:  [name], [name] ([id number]) [id number] Page 1 of 8 Visit Report for 06/12/2023 Arrival Information Details Patient Name: Date of Service: [name] 06/12/2023 8:45 A M Medical Record Number: [id number] Patient Account Number: [id number] Date of Birth/Sex: Treating RN: 1991/07/29 (32 y.o. [name], [name] Primary Care Tonimarie Gritz: [name], PRO [name] Other Clinician: Referring Patrik Turnbaugh: Treating Arely Tinner/Extender: [name] in Treatment: 3 Visit Information History Since Last Visit Added or deleted any medications: No Patient Arrived: Ambulatory Any new allergies or adverse reactions: No Arrival Time: 08:36 Had a fall or experienced change in No Accompanied By: self activities of daily living that may affect Transfer Assistance: None risk of falls: Patient Identification Verified: Yes Signs or symptoms of abuse/neglect since last visito No Secondary Verification Process Completed: Yes Hospitalized since last visit: No Patient Requires Transmission-Based Precautions: No Implantable device outside of the clinic excluding No Patient Has Alerts: No cellular tissue based products placed in the center since last visit: Has Dressing in Place as Prescribed: Yes Has Compression in Place as Prescribed: Yes Pain Present Now: Yes Electronic Signature(s) Signed: 06/12/2023 12:41:20 PM By: Zenaida Deed RN, BSN Entered By: Zenaida Deed on 06/12/2023 08:43:15 -------------------------------------------------------------------------------- Lower Extremity Assessment Details Patient Name: Date of Service: [name] 06/12/2023 8:45 A M Medical Record Number: [id number] Patient Account Number: [id number] Date of Birth/Sex: Treating RN: [birth year] (32 y.o. [name] Primary Care Klair Leising: [name], PRO [name] Other Clinician: Referring Traeger Sultana: Treating Kona Lover/Extender: [name] in Treatment: 3 Edema Assessment Assessed: [Left: No] [Right: No] Edema: [Left: Ye] [Right: s] Calf Left: Right: Point of Measurement: From Medial Instep 36.5 cm Ankle Left: Right: Point of Measurement: From Medial Instep 24 cm Vascular Assessment Pulses: Dorsalis Pedis Palpable: [Left:Yes] Pineda, Travis ([id number]) [Right:[id number] Page 2 of 8] Extremity colors, hair growth, and conditions: Extremity Color: [Left:Hyperpigmented] Hair Growth on Extremity: [Left:Yes] Temperature of Extremity: [Left:Warm] Dependent Rubor: [Left:No] Blanched when Elevated: [Left:No No] Electronic Signature(s) Signed: 06/12/2023 12:41:20 PM By: Zenaida Deed RN, BSN Entered By: Zenaida Deed on 06/12/2023 08:51:18 -------------------------------------------------------------------------------- Multi Wound Chart Details Patient Name: Date of Service: [name] 06/12/2023 8:45 A M Medical Record Number: [id number] Patient Account Number: [id number] Date of Birth/Sex: Treating RN: [birth year] (32 y.o. M) Primary Care Mairyn Lenahan: [name], [name] Other Clinician: Referring [name]: Treating [name]/Extender: [name] in Treatment: 3 Vital Signs Height(in): 71 Pulse(bpm): 79 Weight(lbs): 184 Blood Pressure(mmHg): 155/93 Body Mass Index(BMI): 25.7 Temperature(F): 97.6 Respiratory Rate(breaths/min): 18 [1:Photos:] [Pineda/A:Pineda/A] Left, Lateral Lower Leg Left, Medial Ankle Pineda/A Wound Location: Bite Bite Pineda/A Wounding Event: Calciphylaxis Calciphylaxis Pineda/A Primary Etiology: Hypertension, End Stage Renal Hypertension, End Stage Renal Pineda/A Comorbid History: Disease Disease 03/09/2023 03/09/2023 Pineda/A Date Acquired: 3 3 Pineda/A Weeks of Treatment: Open Open Pineda/A Wound Status: No No Pineda/A Wound Recurrence: 3.8x7x0.2 0.8x2.7x0.1 Pineda/A Measurements L x W x D (cm) 20.892 1.696 Pineda/A A (cm) : rea 4.178 0.17  Pineda/A Volume (cm) : 49.30% -116.10% Pineda/A % Reduction in A rea: 49.30% -115.20% Pineda/A % Reduction in Volume: Full Thickness Without Exposed Full Thickness Without Exposed Pineda/A Classification: Support Structures Support Structures Medium Small Pineda/A Exudate A mount: Serosanguineous Serosanguineous Pineda/A Exudate Type: red, brown red, brown Pineda/A Exudate Color: Distinct, outline attached Distinct, outline attached Pineda/A Wound Margin: Small (1-33%) Medium (34-66%) Pineda/A Granulation A mount: Red, Pink Red, Pink Pineda/A Granulation Quality: Large (67-100%) Medium (34-66%) Pineda/A Necrotic A mount:  [name], [name] ([id number]) [id number] Page 1 of 8 Visit Report for 06/12/2023 Arrival Information Details Patient Name: Date of Service: [name] 06/12/2023 8:45 A M Medical Record Number: [id number] Patient Account Number: [id number] Date of Birth/Sex: Treating RN: 1991/07/29 (32 y.o. [name], [name] Primary Care Tonimarie Gritz: [name], PRO [name] Other Clinician: Referring Patrik Turnbaugh: Treating Arely Tinner/Extender: [name] in Treatment: 3 Visit Information History Since Last Visit Added or deleted any medications: No Patient Arrived: Ambulatory Any new allergies or adverse reactions: No Arrival Time: 08:36 Had a fall or experienced change in No Accompanied By: self activities of daily living that may affect Transfer Assistance: None risk of falls: Patient Identification Verified: Yes Signs or symptoms of abuse/neglect since last visito No Secondary Verification Process Completed: Yes Hospitalized since last visit: No Patient Requires Transmission-Based Precautions: No Implantable device outside of the clinic excluding No Patient Has Alerts: No cellular tissue based products placed in the center since last visit: Has Dressing in Place as Prescribed: Yes Has Compression in Place as Prescribed: Yes Pain Present Now: Yes Electronic Signature(s) Signed: 06/12/2023 12:41:20 PM By: Zenaida Deed RN, BSN Entered By: Zenaida Deed on 06/12/2023 08:43:15 -------------------------------------------------------------------------------- Lower Extremity Assessment Details Patient Name: Date of Service: [name] 06/12/2023 8:45 A M Medical Record Number: [id number] Patient Account Number: [id number] Date of Birth/Sex: Treating RN: [birth year] (32 y.o. [name] Primary Care Klair Leising: [name], PRO [name] Other Clinician: Referring Traeger Sultana: Treating Kona Lover/Extender: [name] in Treatment: 3 Edema Assessment Assessed: [Left: No] [Right: No] Edema: [Left: Ye] [Right: s] Calf Left: Right: Point of Measurement: From Medial Instep 36.5 cm Ankle Left: Right: Point of Measurement: From Medial Instep 24 cm Vascular Assessment Pulses: Dorsalis Pedis Palpable: [Left:Yes] Pineda, Travis ([id number]) [Right:[id number] Page 2 of 8] Extremity colors, hair growth, and conditions: Extremity Color: [Left:Hyperpigmented] Hair Growth on Extremity: [Left:Yes] Temperature of Extremity: [Left:Warm] Dependent Rubor: [Left:No] Blanched when Elevated: [Left:No No] Electronic Signature(s) Signed: 06/12/2023 12:41:20 PM By: Zenaida Deed RN, BSN Entered By: Zenaida Deed on 06/12/2023 08:51:18 -------------------------------------------------------------------------------- Multi Wound Chart Details Patient Name: Date of Service: [name] 06/12/2023 8:45 A M Medical Record Number: [id number] Patient Account Number: [id number] Date of Birth/Sex: Treating RN: [birth year] (32 y.o. M) Primary Care Mairyn Lenahan: [name], [name] Other Clinician: Referring [name]: Treating [name]/Extender: [name] in Treatment: 3 Vital Signs Height(in): 71 Pulse(bpm): 79 Weight(lbs): 184 Blood Pressure(mmHg): 155/93 Body Mass Index(BMI): 25.7 Temperature(F): 97.6 Respiratory Rate(breaths/min): 18 [1:Photos:] [Pineda/A:Pineda/A] Left, Lateral Lower Leg Left, Medial Ankle Pineda/A Wound Location: Bite Bite Pineda/A Wounding Event: Calciphylaxis Calciphylaxis Pineda/A Primary Etiology: Hypertension, End Stage Renal Hypertension, End Stage Renal Pineda/A Comorbid History: Disease Disease 03/09/2023 03/09/2023 Pineda/A Date Acquired: 3 3 Pineda/A Weeks of Treatment: Open Open Pineda/A Wound Status: No No Pineda/A Wound Recurrence: 3.8x7x0.2 0.8x2.7x0.1 Pineda/A Measurements L x W x D (cm) 20.892 1.696 Pineda/A A (cm) : rea 4.178 0.17  Pineda/A Volume (cm) : 49.30% -116.10% Pineda/A % Reduction in A rea: 49.30% -115.20% Pineda/A % Reduction in Volume: Full Thickness Without Exposed Full Thickness Without Exposed Pineda/A Classification: Support Structures Support Structures Medium Small Pineda/A Exudate A mount: Serosanguineous Serosanguineous Pineda/A Exudate Type: red, brown red, brown Pineda/A Exudate Color: Distinct, outline attached Distinct, outline attached Pineda/A Wound Margin: Small (1-33%) Medium (34-66%) Pineda/A Granulation A mount: Red, Pink Red, Pink Pineda/A Granulation Quality: Large (67-100%) Medium (34-66%) Pineda/A Necrotic A mount:

## 2023-06-15 ENCOUNTER — Other Ambulatory Visit: Payer: Self-pay | Admitting: Podiatry

## 2023-06-17 ENCOUNTER — Other Ambulatory Visit: Payer: Self-pay | Admitting: Podiatry

## 2023-06-17 ENCOUNTER — Encounter (HOSPITAL_BASED_OUTPATIENT_CLINIC_OR_DEPARTMENT_OTHER): Payer: Medicare Other | Admitting: General Surgery

## 2023-06-17 DIAGNOSIS — L97322 Non-pressure chronic ulcer of left ankle with fat layer exposed: Secondary | ICD-10-CM | POA: Diagnosis not present

## 2023-06-17 NOTE — Progress Notes (Signed)
[place], [name] ([id number]) [id number] Page 1 of 10 Visit Report for 06/17/2023 Arrival Information Details Patient Name: Date of Service: [name] 06/17/2023 2:00 PM Medical Record Number: [id number] Patient Account Number: [id number] Date of Birth/Sex: Treating RN: [birth year] (32 y.o. [name] Primary Care Aviela Blundell: [name], PRO [name] Other Clinician: Referring Allante Beane: Treating Khalel Alms/Extender: [name] in Treatment: 3 Visit Information History Since Last Visit Added or deleted any medications: No Patient Arrived: Ambulatory Any new allergies or adverse reactions: No Arrival Time: 14:05 Had a fall or experienced change in No Accompanied By: self activities of daily living that may affect Transfer Assistance: None risk of falls: Patient Identification Verified: Yes Signs or symptoms of abuse/neglect since last visito No Secondary Verification Process Completed: Yes Hospitalized since last visit: No Patient Requires Transmission-Based Precautions: No Implantable device outside of the clinic excluding No Patient Has Alerts: No cellular tissue based products placed in the center since last visit: Has Dressing in Place as Prescribed: Yes Pain Present Now: Yes Electronic Signature(s) Signed: 06/17/2023 2:53:36 PM By: Redmond Pulling RN, BSN Entered By: Redmond Pulling on 06/17/2023 11:06:06 -------------------------------------------------------------------------------- Encounter Discharge Information Details Patient Name: Date of Service: [name] 06/17/2023 2:00 PM Medical Record Number: [id number] Patient Account Number: [id number] Date of Birth/Sex: Treating RN: September 29, 1990 (32 y.o. [name] Primary Care [name]: [name], PRO [name] Other Clinician: Referring Manie Bealer: Treating Xzavion Doswell/Extender: [name] in Treatment: 3 Encounter  Discharge Information Items Post Procedure Vitals Discharge Condition: Stable Temperature (F): 99.0 Ambulatory Status: Cane Pulse (bpm): 72 Discharge Destination: Home Respiratory Rate (breaths/min): 18 Transportation: Private Auto Blood Pressure (mmHg): 141/86 Accompanied By: self Schedule Follow-up Appointment: Yes Clinical Summary of Care: Patient Declined Electronic Signature(s) Signed: 06/17/2023 2:53:36 PM By: Redmond Pulling RN, BSN Entered By: Redmond Pulling on 06/17/2023 11:45:52 [name] ([id number]) [id number] Page 2 of 10 -------------------------------------------------------------------------------- Lower Extremity Assessment Details Patient Name: Date of Service: [name] 06/17/2023 2:00 PM Medical Record Number: [id number] Patient Account Number: [id number] Date of Birth/Sex: Treating RN: 24-Jan-1991 (32 y.o. [name] Primary Care Murrel Bertram: [name], PRO [name] Other Clinician: Referring Nonna Renninger: Treating Petrina Melby/Extender: [name] in Treatment: 3 Edema Assessment Assessed: [Left: No] [Right: No] Edema: [Left: Ye] [Right: s] Calf Left: Right: Point of Measurement: From Medial Instep 38.5 cm Ankle Left: Right: Point of Measurement: From Medial Instep 24 cm Vascular Assessment Pulses: Dorsalis Pedis Palpable: [Left:Yes] Extremity colors, hair growth, and conditions: Extremity Color: [Left:Hyperpigmented] Hair Growth on Extremity: [Left:Yes] Temperature of Extremity: [Left:Warm] Dependent Rubor: [Left:No No] Electronic Signature(s) Signed: 06/17/2023 2:53:36 PM By: Redmond Pulling RN, BSN Entered By: Redmond Pulling on 06/17/2023 11:11:41 -------------------------------------------------------------------------------- Multi Wound Chart Details Patient Name: Date of Service: [name] 06/17/2023 2:00 PM Medical Record Number: [id number] Patient Account Number:  [id number] Date of Birth/Sex: Treating RN: 1990/09/27 (32 y.o. 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 Compression Wrap Compression Stockings Add-Ons Electronic Signature(s) Signed: 06/17/2023 2:53:36 PM By: Redmond Pulling RN, BSN Entered By: Redmond Pulling on 06/17/2023 11:19:39 -------------------------------------------------------------------------------- Vitals Details Patient Name: Date of Service: [name] 06/17/2023 2:00 PM Medical Record Number: [id number] Patient Account Number: [id number] Date of Birth/Sex: Treating RN: May 10, 1991 (32 y.o. [name] Primary Care Khaniya Tenaglia: [name], PRO [name] Other Clinician: Referring Rheba Diamond: Treating [name]/Extender: [name] in Treatment: 3 Vital Signs Time Taken: 14:06 Temperature (F): 99.0 [place], Azarion ([id number]) [id number] Page 10 of 10 Height (in): 71 Pulse (bpm): 72 Weight (lbs): 184 Respiratory Rate (breaths/min): 18 Body Mass Index (BMI): 25.7 Blood Pressure (mmHg): 141/86 Reference Range: 80 - 120 mg / dl Electronic Signature(s) Signed: 06/17/2023 2:53:36 PM  By: Redmond Pulling RN, BSN Entered By: Redmond Pulling on 06/17/2023 11:06:27  [place], [name] ([id number]) [id number] Page 1 of 10 Visit Report for 06/17/2023 Arrival Information Details Patient Name: Date of Service: [name] 06/17/2023 2:00 PM Medical Record Number: [id number] Patient Account Number: [id number] Date of Birth/Sex: Treating RN: [birth year] (32 y.o. [name] Primary Care Aviela Blundell: [name], PRO [name] Other Clinician: Referring Allante Beane: Treating Khalel Alms/Extender: [name] in Treatment: 3 Visit Information History Since Last Visit Added or deleted any medications: No Patient Arrived: Ambulatory Any new allergies or adverse reactions: No Arrival Time: 14:05 Had a fall or experienced change in No Accompanied By: self activities of daily living that may affect Transfer Assistance: None risk of falls: Patient Identification Verified: Yes Signs or symptoms of abuse/neglect since last visito No Secondary Verification Process Completed: Yes Hospitalized since last visit: No Patient Requires Transmission-Based Precautions: No Implantable device outside of the clinic excluding No Patient Has Alerts: No cellular tissue based products placed in the center since last visit: Has Dressing in Place as Prescribed: Yes Pain Present Now: Yes Electronic Signature(s) Signed: 06/17/2023 2:53:36 PM By: Redmond Pulling RN, BSN Entered By: Redmond Pulling on 06/17/2023 11:06:06 -------------------------------------------------------------------------------- Encounter Discharge Information Details Patient Name: Date of Service: [name] 06/17/2023 2:00 PM Medical Record Number: [id number] Patient Account Number: [id number] Date of Birth/Sex: Treating RN: September 29, 1990 (32 y.o. [name] Primary Care [name]: [name], PRO [name] Other Clinician: Referring Manie Bealer: Treating Xzavion Doswell/Extender: [name] in Treatment: 3 Encounter  Discharge Information Items Post Procedure Vitals Discharge Condition: Stable Temperature (F): 99.0 Ambulatory Status: Cane Pulse (bpm): 72 Discharge Destination: Home Respiratory Rate (breaths/min): 18 Transportation: Private Auto Blood Pressure (mmHg): 141/86 Accompanied By: self Schedule Follow-up Appointment: Yes Clinical Summary of Care: Patient Declined Electronic Signature(s) Signed: 06/17/2023 2:53:36 PM By: Redmond Pulling RN, BSN Entered By: Redmond Pulling on 06/17/2023 11:45:52 [name] ([id number]) [id number] Page 2 of 10 -------------------------------------------------------------------------------- Lower Extremity Assessment Details Patient Name: Date of Service: [name] 06/17/2023 2:00 PM Medical Record Number: [id number] Patient Account Number: [id number] Date of Birth/Sex: Treating RN: 24-Jan-1991 (32 y.o. [name] Primary Care Murrel Bertram: [name], PRO [name] Other Clinician: Referring Nonna Renninger: Treating Petrina Melby/Extender: [name] in Treatment: 3 Edema Assessment Assessed: [Left: No] [Right: No] Edema: [Left: Ye] [Right: s] Calf Left: Right: Point of Measurement: From Medial Instep 38.5 cm Ankle Left: Right: Point of Measurement: From Medial Instep 24 cm Vascular Assessment Pulses: Dorsalis Pedis Palpable: [Left:Yes] Extremity colors, hair growth, and conditions: Extremity Color: [Left:Hyperpigmented] Hair Growth on Extremity: [Left:Yes] Temperature of Extremity: [Left:Warm] Dependent Rubor: [Left:No No] Electronic Signature(s) Signed: 06/17/2023 2:53:36 PM By: Redmond Pulling RN, BSN Entered By: Redmond Pulling on 06/17/2023 11:11:41 -------------------------------------------------------------------------------- Multi Wound Chart Details Patient Name: Date of Service: [name] 06/17/2023 2:00 PM Medical Record Number: [id number] Patient Account Number:  [id number] Date of Birth/Sex: Treating RN: 1990/09/27 (32 y.o. 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 [name] Primary Care Nielle Duford: [name], PRO [name] Other Clinician: Referring Lanny Donoso: Treating Camren Henthorn/Extender: [name] in Treatment: 3 Multidisciplinary Care Plan reviewed with physician Active Inactive Venous Leg Ulcer [name] ([id number]) [id number] Page 5 of 10 Nursing Diagnoses: Knowledge deficit related to disease process and management Potential for venous Insuffiency (use before diagnosis confirmed) Goals: Patient will maintain optimal  edema control Date Initiated: 06/12/2023 Target Resolution Date: 07/10/2023 Goal Status: Active Interventions: Assess peripheral edema status every visit. Compression as ordered Provide education on venous insufficiency Treatment Activities: Therapeutic compression applied : 06/12/2023 Notes: Wound/Skin Impairment Nursing Diagnoses: Knowledge deficit related to smoking impact on wound healing Goals: Patient/caregiver will verbalize understanding of skin care regimen Date Initiated: 05/21/2023 Target Resolution Date: 07/09/2023 Goal Status: Active Interventions: Assess patient/caregiver ability to obtain necessary supplies Assess patient/caregiver ability to perform ulcer/skin care regimen upon admission and as needed Assess ulceration(s) every visit Provide education on ulcer and skin care Screen for HBO Treatment Activities: Skin care regimen initiated : 05/21/2023 Topical wound management initiated : 05/21/2023 Notes: Electronic Signature(s) Signed: 06/17/2023 2:53:36 PM By: Redmond Pulling RN, BSN Entered By: Redmond Pulling on 06/17/2023 11:25:07 -------------------------------------------------------------------------------- Pain Assessment Details Patient Name: Date of Service: [name] 06/17/2023 2:00 PM Medical Record Number: [id number] Patient Account Number: [id number] Date of Birth/Sex: Treating RN: 1991-07-01 (32 y.o. [name] Primary Care Bently Morath: [name], PRO [name] Other Clinician: Referring Marayah Higdon: Treating Julieann Drummonds/Extender: [name] in Treatment: 3 Active Problems Location of Pain Severity and Description of Pain Patient Has Paino No Site Locations [place], [place] ([id number]) [id number] Page 6 of 10 Pain Management and Medication Current Pain Management: Electronic Signature(s) Signed: 06/17/2023 2:53:36 PM By: Redmond Pulling RN, BSN Entered By: Redmond Pulling on  06/17/2023 11:06:41 -------------------------------------------------------------------------------- Patient/Caregiver Education Details Patient Name: Date of Service: [name] 10/9/2024andnbsp2:00 PM Medical Record Number: [id number] Patient Account Number: [id number] Date of Birth/Gender: Treating RN: Jul 25, 1991 (32 y.o. [name] Primary Care Physician: [name], PRO [name] Other Clinician: Referring Physician: Treating Physician/Extender: [name] in Treatment: 3 Education Assessment Education Provided To: Patient Education Topics Provided Wound/Skin Impairment: Methods: Explain/Verbal Responses: State content correctly Electronic Signature(s) Signed: 06/17/2023 2:53:36 PM By: Redmond Pulling RN, BSN Entered By: Redmond Pulling on 06/17/2023 11:25:24 -------------------------------------------------------------------------------- Wound Assessment Details Patient Name: Date of Service: [name] 06/17/2023 2:00 PM Medical Record Number: [id number] Patient Account Number: [id number] Date of Birth/Sex: Treating RN: 1991/08/12 (32 y.o. [name] Primary Care Konstantine Gervasi: [name], PRO [name] Other Clinician: Referring Shalanda Brogden: Treating Ajaya Crutchfield/Extender: Lucendia Herrlich [place], [name] ([id number]) [id number] Page 7 of 10 Weeks in Treatment: 3 Wound Status Wound Number: 1 Primary Etiology: Calciphylaxis Wound Location: Left, Lateral Lower Leg Wound Status: Open Wounding Event: Bite Comorbid History: Hypertension, End Stage Renal Disease Date Acquired: 03/09/2023 Weeks Of Treatment: 3 Clustered Wound: No Photos Wound Measurements Length: (cm) 4.3 Width: (cm) 6.3 Depth: (cm) 0.2 Area: (cm) 21.276 Volume: (cm) 4.255 % Reduction in Area: 48.4% % Reduction in Volume: 48.4% Epithelialization: Medium (34-66%) Tunneling: No Undermining: No Wound  Description Classification: Full Thickness Without Exposed Support Structures Wound Margin: Distinct, outline attached Exudate Amount: Medium Exudate Type: Serosanguineous Exudate Color: red, brown Foul Odor After Cleansing: No Slough/Fibrino Yes Wound Bed  with tape as directed. Compression Wrap Compression Stockings Add-Ons Electronic Signature(s) Signed: 06/17/2023 2:53:36 PM By: Redmond Pulling RN, BSN Entered By: Redmond Pulling on 06/17/2023 11:19:39 -------------------------------------------------------------------------------- Vitals Details Patient Name: Date of Service: [name] 06/17/2023 2:00 PM Medical Record Number: [id number] Patient Account Number: [id number] Date of Birth/Sex: Treating RN: May 10, 1991 (32 y.o. [name] Primary Care Khaniya Tenaglia: [name], PRO [name] Other Clinician: Referring Rheba Diamond: Treating [name]/Extender: [name] in Treatment: 3 Vital Signs Time Taken: 14:06 Temperature (F): 99.0 Reading, Azar[place] ([id number]) [id number] Page 10 of 10 Height (in): 71 Pulse (bpm): 72 Weight (lbs): 184 Respiratory Rate (breaths/min): 18 Body Mass Index (BMI): 25.7 Blood Pressure (mmHg): 141/86 Reference Range: 80 - 120 mg / dl Electronic Signature(s) Signed: 06/17/2023 2:53:36 PM  By: Redmond Pulling RN, BSN Entered By: Redmond Pulling on 06/17/2023 11:06:27

## 2023-06-18 NOTE — Progress Notes (Signed)
[name], [name] ([id number]) [id number] Page 1 of 9 Visit Report for 06/17/2023 Chief Complaint Document Details Patient Name: Date of Service: [name] 06/17/2023 2:00 PM Medical Record Number: [id number] Patient Account Number: [id number] Date of Birth/Sex: Treating RN: 08-17-1991 (32 y.o. 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 [name] Primary Care Provider: [name], PRO [name] Other Clinician: Referring Provider: Treating Provider/Extender: [name] in Treatment: 3 Debridement Performed for Assessment: Wound #1 Left,Lateral Lower Leg Performed By: Physician Duanne Guess, MD The following information was scribed by: Redmond Pulling The information was scribed for: Duanne Guess Debridement Type: Debridement Level of Consciousness (Pre-procedure): Awake and Alert Pre-procedure Verification/Time Out Yes - 14:25 Taken: Start Time: 14:27 Pain Control: Lidocaine 5% topical ointment Percent of Wound Bed Debrided: 100% T Area Debrided (cm): otal 21.27 Tissue and other material debrided: Non-Viable, Slough, Slough Level: Non-Viable Tissue Debridement Description: Selective/Open Wound Instrument: Curette Bleeding:  Minimum Hemostasis Achieved: Pressure Response to Treatment: Procedure was tolerated well Level of Consciousness (Post- Awake and Alert procedure): Post Debridement Measurements of Total Wound Length: (cm) 4.3 Width: (cm) 6.3 Depth: (cm) 0.2 Volume: (cm) 4.255 Character of Wound/Ulcer Post Debridement: Improved Post Procedure Diagnosis [name] ([id number]) [id number] Page 2 of 9 Same as Pre-procedure Electronic Signature(s) Signed: 06/17/2023 2:53:36 PM By: Redmond Pulling RN, BSN Signed: 06/18/2023 7:55:21 AM By: [name] Entered By: Redmond Pulling on 06/17/2023 11:29:15 -------------------------------------------------------------------------------- HPI Details Patient Name: Date of Service: [name] 06/17/2023 2:00 PM Medical Record Number: [id number] Patient Account Number: [id number] Date of Birth/Sex: Treating RN: 01-Feb-1991 (32 y.o. M) Primary Care Provider: [name], [name] Other Clinician: Referring Provider: Treating Provider/Extender: [name] in Treatment: 3 History of Present Illness HPI Description: ADMISSION 05/21/2023 This is a 32 year old man with end-stage renal disease on hemodialysis. In July of this year, he was seen by podiatry for "cellulitis with an open wound on his left foot and ankle.". He apparently was given clindamycin and mupirocin by urgent care prior to his visit with Dr. Annamary Rummage. At that time, the wound was felt to be improving on the prescribed regimen. About 3 weeks later, the wound was worsening. 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 The patient has presented to the emergency department in [facility] several times due to increased pain. He was apparently referred to plastic surgery from the ED. He saw Dr. 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 According to the dialysis rounding note from Tuesday of this week, sodium thiosulfate is being empirically initiated. I still do not see that a biopsy has yet been performed. He is not currently taking Sensipar, as far as I can see. His intact PTH is actually not particularly high  Problem List Details Patient Name: Date of Service: [name] 06/17/2023 2:00 PM Medical Record Number: [id number] Patient Account Number: [id number] Date of Birth/Sex: Treating RN: May 22, 1991 (32 y.o. M) Primary Care Provider: [name], [name] Other Clinician: Referring Provider: Treating Provider/Extender: [name] in Treatment: 3 Active Problems ICD-10 Encounter Code Description Active Date MDM Diagnosis L97.322 Non-pressure chronic ulcer of left ankle with fat layer exposed 05/21/2023 No Yes N18.6 End stage renal disease 05/21/2023 No Yes Z99.2 Dependence on renal dialysis 05/21/2023 No Yes Inactive Problems Resolved Problems Electronic Signature(s) Signed: 06/17/2023 2:53:29 PM By: [name] Entered By: Duanne Guess on 06/17/2023 11:53:29 -------------------------------------------------------------------------------- Progress Note Details Patient Name: Date of  Service: [name] 06/17/2023 2:00 PM Medical Record Number: [id number] Patient Account Number: [id number] Date of Birth/Sex: Treating RN: [birth year] (32 y.o. 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 In July of this year, he was seen by podiatry for "cellulitis with an open wound on his left foot and ankle.". He apparently was given clindamycin and mupirocin by urgent care prior to his visit with Dr. Annamary Rummage. At that time, the wound was felt to be improving on the prescribed regimen. About 3 weeks later, the wound was worsening. 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 The patient has presented to the emergency department in [facility] several times due to increased pain. He was apparently referred to plastic surgery from the ED. He saw Dr. 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 According to the dialysis rounding note from Tuesday of this week, sodium thiosulfate is being empirically initiated. I still do not see that a biopsy has yet been performed. He is not currently taking Sensipar, as far as I can see. His intact PTH is actually not particularly high with his last value being 173 and his calcium phosphorus product only 55, although his phosphorus has only recently  started to come under control with previous values in the 7-8 range. 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 05/27/2023: The wounds actually look better this week. There has been some epithelialization. The biopsy site has a fair amount of slough accumulation. The wounds are still quite painful. He reports that they have definitely started sodium thiosulfate at his dialysis sessions and he received a call from [facility] regarding the referral for parathyroidectomy, but he was unable to answer and will be calling them back. No results from the biopsy so far. 06/05/2023: The wounds continue to improve. There is more epithelialization. There is slough buildup on the wound surfaces, particularly at the biopsy site. 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 The pattern correlated with stasis dermatitis or trauma. 06/12/2023: The lateral wound is looking quite a bit better this week. Multiple small pockmarks have healed. There is a fair amount  [name], [name] ([id number]) [id number] Page 8 of 9 Secured With: 60M Medipore H Soft Cloth Surgical T ape, 4 x 10 (in/yd) 1 x Per Day/30 Days Discharge Instructions: Secure with tape as directed. 06/17/2023: Both wounds are filling in with good epithelialized tissue. They still have slough on the wound surfaces. They continue to be exquisitely painful. I used a curette to debride slough off of the lateral leg wound surfaces. There is good granulation tissue beginning to emerge. He would not permit medial leg ulcer debridement secondary to pain. He says that he is still getting good pain relief from the Silvadene so we will continue to apply this along with Hydrofera Blue. I reminded him of the importance of wrapping the Ace  bandage quite snugly to provide adequate compression. We will follow-up his venous reflux studies and if positive, refer him for potential saphenous vein ablation. Follow-up in 1 week. Electronic Signature(s) Signed: 06/17/2023 2:57:50 PM By: [name] Entered By: Duanne Guess on 06/17/2023 11:57:49 -------------------------------------------------------------------------------- HxROS Details Patient Name: Date of Service: [name] 06/17/2023 2:00 PM Medical Record Number: [id number] Patient Account Number: [id number] Date of Birth/Sex: Treating RN: 1991/07/16 (32 y.o. M) Primary Care Provider: [name], [name] Other Clinician: Referring Provider: Treating Provider/Extender: [name] in Treatment: 3 Information Obtained From Chart Cardiovascular Medical History: Positive for: Hypertension Genitourinary Medical History: Positive for: End Stage Renal Disease Past Medical History Notes: GI bleed, ulcer Psychiatric Medical History: Past Medical History Notes: anxiety, depression Immunizations Pneumococcal Vaccine: Received Pneumococcal Vaccination: No Implantable Devices No devices added Hospitalization / Surgery History Type of Hospitalization/Surgery av fistula placement EGD Family and Social History Diabetes: Yes - Mother; Heart Disease: Yes - Mother; Hypertension: Yes - Mother; Kidney Disease: Yes - Siblings; Former smoker - ended on 03/09/2019; Alcohol Use: Never; Drug Use: No History; Caffeine Use: Never; Financial Concerns: No; Food, Clothing or Shelter Needs: No; Support System Lacking: No; Transportation Concerns: No Electronic Signature(s) Signed: 06/18/2023 7:55:21 AM By: [name] Tracy, [name] ([id number]) AM By: [name] [phone].pdf Page 9 of 9 Signed: 06/18/2023 7:55:21 Entered By: Duanne Guess on 06/17/2023  11:55:52 -------------------------------------------------------------------------------- SuperBill Details Patient Name: Date of Service: [name] 06/17/2023 Medical Record Number: [id number] Patient Account Number: [id number] Date of Birth/Sex: Treating RN: Feb 08, 1991 (32 y.o. M) Primary Care Provider: [name], [name] Other Clinician: Referring Provider: Treating Provider/Extender: [name] in Treatment: 3 Diagnosis Coding ICD-10 Codes Code Description [phone] Non-pressure chronic ulcer of left ankle with fat layer exposed N18.6 End stage renal disease Z99.2 Dependence on renal dialysis Facility Procedures : CPT4 Code: [id number] Description: 97597 - DEBRIDE WOUND 1ST 20 SQ CM OR < ICD-10 Diagnosis Description L97.322 Non-pressure chronic ulcer of left ankle with fat layer exposed Modifier: Quantity: 1 : CPT4 Code: [id number] Description: 97598 - DEBRIDE WOUND EA ADDL 20 SQ CM ICD-10 Diagnosis Description L97.322 Non-pressure chronic ulcer of left ankle with fat layer exposed Modifier: Quantity: 1 Physician Procedures : CPT4 Code Description Modifier [id number] 99214 - WC PHYS LEVEL 4 - EST PT 25 ICD-10 Diagnosis Description L97.322 Non-pressure chronic ulcer of left ankle with fat layer exposed N18.6 End stage renal disease Z99.2 Dependence on renal dialysis Quantity: 1 : [id number] 97597 - WC PHYS DEBR WO ANESTH 20 SQ CM ICD-10 Diagnosis Description L97.322 Non-pressure chronic ulcer of left ankle with fat layer exposed Quantity: 1 : [id number] 97598 - WC PHYS  Problem List Details Patient Name: Date of Service: [name] 06/17/2023 2:00 PM Medical Record Number: [id number] Patient Account Number: [id number] Date of Birth/Sex: Treating RN: May 22, 1991 (32 y.o. M) Primary Care Provider: [name], [name] Other Clinician: Referring Provider: Treating Provider/Extender: [name] in Treatment: 3 Active Problems ICD-10 Encounter Code Description Active Date MDM Diagnosis L97.322 Non-pressure chronic ulcer of left ankle with fat layer exposed 05/21/2023 No Yes N18.6 End stage renal disease 05/21/2023 No Yes Z99.2 Dependence on renal dialysis 05/21/2023 No Yes Inactive Problems Resolved Problems Electronic Signature(s) Signed: 06/17/2023 2:53:29 PM By: [name] Entered By: Duanne Guess on 06/17/2023 11:53:29 -------------------------------------------------------------------------------- Progress Note Details Patient Name: Date of  Service: [name] 06/17/2023 2:00 PM Medical Record Number: [id number] Patient Account Number: [id number] Date of Birth/Sex: Treating RN: [birth year] (32 y.o. 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 In July of this year, he was seen by podiatry for "cellulitis with an open wound on his left foot and ankle.". He apparently was given clindamycin and mupirocin by urgent care prior to his visit with Dr. Annamary Rummage. At that time, the wound was felt to be improving on the prescribed regimen. About 3 weeks later, the wound was worsening. 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 The patient has presented to the emergency department in [facility] several times due to increased pain. He was apparently referred to plastic surgery from the ED. He saw Dr. 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 According to the dialysis rounding note from Tuesday of this week, sodium thiosulfate is being empirically initiated. I still do not see that a biopsy has yet been performed. He is not currently taking Sensipar, as far as I can see. His intact PTH is actually not particularly high with his last value being 173 and his calcium phosphorus product only 55, although his phosphorus has only recently  started to come under control with previous values in the 7-8 range. 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 05/27/2023: The wounds actually look better this week. There has been some epithelialization. The biopsy site has a fair amount of slough accumulation. The wounds are still quite painful. He reports that they have definitely started sodium thiosulfate at his dialysis sessions and he received a call from [facility] regarding the referral for parathyroidectomy, but he was unable to answer and will be calling them back. No results from the biopsy so far. 06/05/2023: The wounds continue to improve. There is more epithelialization. There is slough buildup on the wound surfaces, particularly at the biopsy site. 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 The pattern correlated with stasis dermatitis or trauma. 06/12/2023: The lateral wound is looking quite a bit better this week. Multiple small pockmarks have healed. There is a fair amount  clinic) Topical Lidocaine 4% applied to wound bed Bathing/ Shower/ Hygiene May shower and wash wound with soap and water. Off-Loading Crutches for Offloading - Please use two crutches for offloading. Wound Treatment Wound #1 - Lower Leg Wound Laterality: Left, Lateral Cleanser: Soap and Water 1 x Per Day/30 Days Discharge Instructions: May shower and wash wound with dial antibacterial soap and water prior to dressing change. Cleanser: Vashe 5.8 (oz) 1 x Per Day/30 Days Discharge Instructions: Cleanse the wound with Vashe prior to applying a clean dressing using gauze sponges, not tissue or cotton balls. Peri-Wound Care: Silver Sulfadiazine Cream 1%, 25 (g), tube (Generic) 1 x Per Day/30 Days Prim Dressing: Hydrofera Blue Ready Transfer Foam, 4x5 (in/in) (Generic) 1 x Per Day/30 Days ary Discharge Instructions: Apply to wound bed as instructed Secondary Dressing: Zetuvit Plus Silicone Border Dressing 5x5 (in/in) (Dispense As Written) 1 x Per Day/30 Days Discharge Instructions: Apply silicone border over primary dressing as directed. Secured With: Elastic Bandage 4 inch (ACE bandage) (Generic) 1 x Per Day/30 Days Discharge Instructions: Secure with ACE bandage as directed. [name], [name] ([id number]) [id number] Page 4 of  9 Secured With: [employer], 4.5x3.1 (in/yd) (Generic) 1 x Per Day/30 Days Discharge Instructions: Secure with Kerlix as directed. Secured With: Paper Tape, 2x10 (in/yd) (Generic) 1 x Per Day/30 Days Discharge Instructions: Secure dressing with tape as directed. Wound #2 - Ankle Wound Laterality: Left, Medial Cleanser: Soap and Water 1 x Per Day/30 Days Discharge Instructions: May shower and wash wound with dial antibacterial soap and water prior to dressing change. Cleanser: Vashe 5.8 (oz) (Generic) 1 x Per Day/30 Days Discharge Instructions: Cleanse the wound with Vashe prior to applying a clean dressing using gauze sponges, not tissue or cotton balls. Peri-Wound Care: Silver Sulfadiazine Cream 1%, 25 (g), tube (Generic) 1 x Per Day/30 Days Prim Dressing: Hydrofera Blue Ready Transfer Foam, 4x5 (in/in) (Generic) 1 x Per Day/30 Days ary Discharge Instructions: Apply to wound bed as instructed Secondary Dressing: Zetuvit Plus Silicone Border Dressing 4x4 (in/in) (Dispense As Written) 1 x Per Day/30 Days Discharge Instructions: Apply silicone border over primary dressing as directed. Secured With: Elastic Bandage 4 inch (ACE bandage) 1 x Per Day/30 Days Discharge Instructions: Secure with ACE bandage as directed. Secured With: [employer], 4.5x3.1 (in/yd) 1 x Per Day/30 Days Discharge Instructions: Secure with Kerlix as directed. Secured With: 9M Medipore H Soft Cloth Surgical T ape, 4 x 10 (in/yd) 1 x Per Day/30 Days Discharge Instructions: Secure with tape as directed. Patient Medications llergies: No Known Allergies A Notifications Medication Indication Start End 06/17/2023 lidocaine DOSE topical 5 % ointment - ointment topical once daily Electronic Signature(s) Signed: 06/18/2023 7:55:21 AM By: [name] Previous Signature: 06/17/2023 2:53:36 PM Version By: Redmond Pulling RN, BSN Entered By: Duanne Guess on 06/17/2023 11:56:37 Prescription  06/17/2023 -------------------------------------------------------------------------------- Alona Bene MD Patient Name: Provider: 12/01/1990 [id number] Date of Birth: NPI#: [id number] Sex: DEA #: [id number] Phone #: License #: UPN: Patient Address: [address] Eligha Bridegroom [facility] [place] [address] , [address] [place], [place] [address] [phone] Allergies No Known Allergies Provider's Orders Crutches for Offloading - Please use two crutches for offloading. DEMAREA, LOREY ([id number]) [id number] Page 5 of 9 Hand Signature: Date(s): Electronic Signature(s) Signed: 06/18/2023 7:55:21 AM By: [name] Previous Signature: 06/17/2023 2:53:36 PM Version By: Redmond Pulling RN, BSN Entered By: Duanne Guess on 06/17/2023 11:56:38 --------------------------------------------------------------------------------  [name], [name] ([id number]) [id number] Page 1 of 9 Visit Report for 06/17/2023 Chief Complaint Document Details Patient Name: Date of Service: [name] 06/17/2023 2:00 PM Medical Record Number: [id number] Patient Account Number: [id number] Date of Birth/Sex: Treating RN: 08-17-1991 (32 y.o. 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 [name] Primary Care Provider: [name], PRO [name] Other Clinician: Referring Provider: Treating Provider/Extender: [name] in Treatment: 3 Debridement Performed for Assessment: Wound #1 Left,Lateral Lower Leg Performed By: Physician Duanne Guess, MD The following information was scribed by: Redmond Pulling The information was scribed for: Duanne Guess Debridement Type: Debridement Level of Consciousness (Pre-procedure): Awake and Alert Pre-procedure Verification/Time Out Yes - 14:25 Taken: Start Time: 14:27 Pain Control: Lidocaine 5% topical ointment Percent of Wound Bed Debrided: 100% T Area Debrided (cm): otal 21.27 Tissue and other material debrided: Non-Viable, Slough, Slough Level: Non-Viable Tissue Debridement Description: Selective/Open Wound Instrument: Curette Bleeding:  Minimum Hemostasis Achieved: Pressure Response to Treatment: Procedure was tolerated well Level of Consciousness (Post- Awake and Alert procedure): Post Debridement Measurements of Total Wound Length: (cm) 4.3 Width: (cm) 6.3 Depth: (cm) 0.2 Volume: (cm) 4.255 Character of Wound/Ulcer Post Debridement: Improved Post Procedure Diagnosis [name] ([id number]) [id number] Page 2 of 9 Same as Pre-procedure Electronic Signature(s) Signed: 06/17/2023 2:53:36 PM By: Redmond Pulling RN, BSN Signed: 06/18/2023 7:55:21 AM By: [name] Entered By: Redmond Pulling on 06/17/2023 11:29:15 -------------------------------------------------------------------------------- HPI Details Patient Name: Date of Service: [name] 06/17/2023 2:00 PM Medical Record Number: [id number] Patient Account Number: [id number] Date of Birth/Sex: Treating RN: 01-Feb-1991 (32 y.o. M) Primary Care Provider: [name], [name] Other Clinician: Referring Provider: Treating Provider/Extender: [name] in Treatment: 3 History of Present Illness HPI Description: ADMISSION 05/21/2023 This is a 32 year old man with end-stage renal disease on hemodialysis. In July of this year, he was seen by podiatry for "cellulitis with an open wound on his left foot and ankle.". He apparently was given clindamycin and mupirocin by urgent care prior to his visit with Dr. Annamary Rummage. At that time, the wound was felt to be improving on the prescribed regimen. About 3 weeks later, the wound was worsening. 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 The patient has presented to the emergency department in [facility] several times due to increased pain. He was apparently referred to plastic surgery from the ED. He saw Dr. 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 According to the dialysis rounding note from Tuesday of this week, sodium thiosulfate is being empirically initiated. I still do not see that a biopsy has yet been performed. He is not currently taking Sensipar, as far as I can see. His intact PTH is actually not particularly high  [name], [name] ([id number]) [id number] Page 1 of 9 Visit Report for 06/17/2023 Chief Complaint Document Details Patient Name: Date of Service: [name] 06/17/2023 2:00 PM Medical Record Number: [id number] Patient Account Number: [id number] Date of Birth/Sex: Treating RN: 08-17-1991 (32 y.o. 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 [name] Primary Care Provider: [name], PRO [name] Other Clinician: Referring Provider: Treating Provider/Extender: [name] in Treatment: 3 Debridement Performed for Assessment: Wound #1 Left,Lateral Lower Leg Performed By: Physician Duanne Guess, MD The following information was scribed by: Redmond Pulling The information was scribed for: Duanne Guess Debridement Type: Debridement Level of Consciousness (Pre-procedure): Awake and Alert Pre-procedure Verification/Time Out Yes - 14:25 Taken: Start Time: 14:27 Pain Control: Lidocaine 5% topical ointment Percent of Wound Bed Debrided: 100% T Area Debrided (cm): otal 21.27 Tissue and other material debrided: Non-Viable, Slough, Slough Level: Non-Viable Tissue Debridement Description: Selective/Open Wound Instrument: Curette Bleeding:  Minimum Hemostasis Achieved: Pressure Response to Treatment: Procedure was tolerated well Level of Consciousness (Post- Awake and Alert procedure): Post Debridement Measurements of Total Wound Length: (cm) 4.3 Width: (cm) 6.3 Depth: (cm) 0.2 Volume: (cm) 4.255 Character of Wound/Ulcer Post Debridement: Improved Post Procedure Diagnosis [name] ([id number]) [id number] Page 2 of 9 Same as Pre-procedure Electronic Signature(s) Signed: 06/17/2023 2:53:36 PM By: Redmond Pulling RN, BSN Signed: 06/18/2023 7:55:21 AM By: [name] Entered By: Redmond Pulling on 06/17/2023 11:29:15 -------------------------------------------------------------------------------- HPI Details Patient Name: Date of Service: [name] 06/17/2023 2:00 PM Medical Record Number: [id number] Patient Account Number: [id number] Date of Birth/Sex: Treating RN: 01-Feb-1991 (32 y.o. M) Primary Care Provider: [name], [name] Other Clinician: Referring Provider: Treating Provider/Extender: [name] in Treatment: 3 History of Present Illness HPI Description: ADMISSION 05/21/2023 This is a 32 year old man with end-stage renal disease on hemodialysis. In July of this year, he was seen by podiatry for "cellulitis with an open wound on his left foot and ankle.". He apparently was given clindamycin and mupirocin by urgent care prior to his visit with Dr. Annamary Rummage. At that time, the wound was felt to be improving on the prescribed regimen. About 3 weeks later, the wound was worsening. 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 The patient has presented to the emergency department in [facility] several times due to increased pain. He was apparently referred to plastic surgery from the ED. He saw Dr. 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 According to the dialysis rounding note from Tuesday of this week, sodium thiosulfate is being empirically initiated. I still do not see that a biopsy has yet been performed. He is not currently taking Sensipar, as far as I can see. His intact PTH is actually not particularly high  clinic) Topical Lidocaine 4% applied to wound bed Bathing/ Shower/ Hygiene May shower and wash wound with soap and water. Off-Loading Crutches for Offloading - Please use two crutches for offloading. Wound Treatment Wound #1 - Lower Leg Wound Laterality: Left, Lateral Cleanser: Soap and Water 1 x Per Day/30 Days Discharge Instructions: May shower and wash wound with dial antibacterial soap and water prior to dressing change. Cleanser: Vashe 5.8 (oz) 1 x Per Day/30 Days Discharge Instructions: Cleanse the wound with Vashe prior to applying a clean dressing using gauze sponges, not tissue or cotton balls. Peri-Wound Care: Silver Sulfadiazine Cream 1%, 25 (g), tube (Generic) 1 x Per Day/30 Days Prim Dressing: Hydrofera Blue Ready Transfer Foam, 4x5 (in/in) (Generic) 1 x Per Day/30 Days ary Discharge Instructions: Apply to wound bed as instructed Secondary Dressing: Zetuvit Plus Silicone Border Dressing 5x5 (in/in) (Dispense As Written) 1 x Per Day/30 Days Discharge Instructions: Apply silicone border over primary dressing as directed. Secured With: Elastic Bandage 4 inch (ACE bandage) (Generic) 1 x Per Day/30 Days Discharge Instructions: Secure with ACE bandage as directed. [name], [name] ([id number]) [id number] Page 4 of  9 Secured With: [employer], 4.5x3.1 (in/yd) (Generic) 1 x Per Day/30 Days Discharge Instructions: Secure with Kerlix as directed. Secured With: Paper Tape, 2x10 (in/yd) (Generic) 1 x Per Day/30 Days Discharge Instructions: Secure dressing with tape as directed. Wound #2 - Ankle Wound Laterality: Left, Medial Cleanser: Soap and Water 1 x Per Day/30 Days Discharge Instructions: May shower and wash wound with dial antibacterial soap and water prior to dressing change. Cleanser: Vashe 5.8 (oz) (Generic) 1 x Per Day/30 Days Discharge Instructions: Cleanse the wound with Vashe prior to applying a clean dressing using gauze sponges, not tissue or cotton balls. Peri-Wound Care: Silver Sulfadiazine Cream 1%, 25 (g), tube (Generic) 1 x Per Day/30 Days Prim Dressing: Hydrofera Blue Ready Transfer Foam, 4x5 (in/in) (Generic) 1 x Per Day/30 Days ary Discharge Instructions: Apply to wound bed as instructed Secondary Dressing: Zetuvit Plus Silicone Border Dressing 4x4 (in/in) (Dispense As Written) 1 x Per Day/30 Days Discharge Instructions: Apply silicone border over primary dressing as directed. Secured With: Elastic Bandage 4 inch (ACE bandage) 1 x Per Day/30 Days Discharge Instructions: Secure with ACE bandage as directed. Secured With: [employer], 4.5x3.1 (in/yd) 1 x Per Day/30 Days Discharge Instructions: Secure with Kerlix as directed. Secured With: 9M Medipore H Soft Cloth Surgical T ape, 4 x 10 (in/yd) 1 x Per Day/30 Days Discharge Instructions: Secure with tape as directed. Patient Medications llergies: No Known Allergies A Notifications Medication Indication Start End 06/17/2023 lidocaine DOSE topical 5 % ointment - ointment topical once daily Electronic Signature(s) Signed: 06/18/2023 7:55:21 AM By: [name] Previous Signature: 06/17/2023 2:53:36 PM Version By: Redmond Pulling RN, BSN Entered By: Duanne Guess on 06/17/2023 11:56:37 Prescription  06/17/2023 -------------------------------------------------------------------------------- Alona Bene MD Patient Name: Provider: 12/01/1990 [id number] Date of Birth: NPI#: [id number] Sex: DEA #: [id number] Phone #: License #: UPN: Patient Address: [address] Eligha Bridegroom [facility] [place] [address] , [address] [place], [place] [address] [phone] Allergies No Known Allergies Provider's Orders Crutches for Offloading - Please use two crutches for offloading. DEMAREA, LOREY ([id number]) [id number] Page 5 of 9 Hand Signature: Date(s): Electronic Signature(s) Signed: 06/18/2023 7:55:21 AM By: [name] Previous Signature: 06/17/2023 2:53:36 PM Version By: Redmond Pulling RN, BSN Entered By: Duanne Guess on 06/17/2023 11:56:38 --------------------------------------------------------------------------------

## 2023-06-19 ENCOUNTER — Other Ambulatory Visit (HOSPITAL_COMMUNITY): Payer: Self-pay | Admitting: General Surgery

## 2023-06-19 ENCOUNTER — Ambulatory Visit (HOSPITAL_COMMUNITY)
Admission: RE | Admit: 2023-06-19 | Discharge: 2023-06-19 | Disposition: A | Discharge status: Home / Self Care | Payer: Medicare Other | Source: Ambulatory Visit | Attending: Vascular Surgery | Admitting: Vascular Surgery

## 2023-06-19 DIAGNOSIS — L97322 Non-pressure chronic ulcer of left ankle with fat layer exposed: Secondary | ICD-10-CM | POA: Insufficient documentation

## 2023-06-24 ENCOUNTER — Encounter (HOSPITAL_BASED_OUTPATIENT_CLINIC_OR_DEPARTMENT_OTHER): Payer: Medicare Other | Admitting: General Surgery

## 2023-06-24 DIAGNOSIS — L97322 Non-pressure chronic ulcer of left ankle with fat layer exposed: Secondary | ICD-10-CM | POA: Diagnosis not present

## 2023-06-25 NOTE — Progress Notes (Signed)
Service: [name] 06/24/2023 3:15 PM Medical Record Number: [id number] Patient Account Number: [id number] Date of Birth/Sex: Treating RN: [birth year] (32 y.o. M) Primary Care Provider: Jannetta Pineda ULT, [name] Other Clinician: Referring Provider: Treating Provider/Extender: [name] in Treatment: 4 Constitutional Hypertensive, asymptomatic. . . . no acute distress. Respiratory Normal work of breathing on room air. Notes 06/24/2023: The wounds continue to epithelialize. He does accumulate a bit of slough on the open surfaces but the medial ankle wound is nearly closed. They are far less painful than they have been, as well. Electronic Signature(s) Signed: 06/24/2023 3:52:28 PM By: Travis Guess MD FACS Entered By: [name] on 06/24/2023 12:52:28 [name] ([id number]) [id number] Page 4 of 10 -------------------------------------------------------------------------------- Physician Orders Details Patient Name: Date of Service: [name] 06/24/2023 3:15 PM Medical Record Number: [id number] Patient Account Number:  [id number] Date of Birth/Sex: Treating RN: 12/05/1990 (32 y.o. 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 - Dr Lady Gary RM 3 - pt has appt. Return A Anesthetic Wound #1 Left,Lateral Lower Leg (In clinic) Topical Lidocaine 5% applied to wound bed (In clinic) Topical Lidocaine 4% applied to wound bed - May also buy over the counter and use with other treatments Wound #2 Left,Medial Ankle (In clinic) Topical Lidocaine 5% applied to wound bed (In clinic) Topical Lidocaine 4% applied to wound bed Bathing/ Shower/ Hygiene May shower and wash wound with soap and water. Off-Loading Crutches for Offloading - Please use two crutches for offloading. Additional Orders / Instructions Other: - May purchase hydrocortisone cream to use on periwound area at home. Wound Treatment Wound #1 - Lower Leg Wound Laterality: Left, Lateral Cleanser: Soap and Water 1 x Per Day/30 Days Discharge Instructions: May shower and wash wound with dial antibacterial soap and water prior to dressing change. Cleanser: Vashe 5.8 (oz) 1 x Per Day/30 Days Discharge Instructions: Cleanse the wound with Vashe prior to applying a clean dressing using gauze sponges, not tissue or cotton balls. Peri-Wound Care: Silver Sulfadiazine Cream 1%, 25 (g), tube (Generic) 1 x Per Day/30 Days Topical: Triamcinolone 1 x Per Day/30 Days Discharge Instructions: Periwound - Apply Triamcinolone as  directed Prim Dressing: Hydrofera Blue Ready Transfer Foam, 4x5 (in/in) (DME) (Generic) 1 x Per Day/30 Days ary Discharge Instructions: Apply to wound bed as instructed Secondary Dressing: Zetuvit Plus Silicone Border Dressing 5x5 (in/in) (Dispense As Written) 1 x Per Day/30 Days Discharge Instructions: Apply silicone border over primary dressing as directed. Secured With: Elastic Bandage 4 inch (ACE bandage) (Generic) 1 x Per Day/30 Days Discharge Instructions: Secure with ACE bandage as directed. Secured With: [employer], 4.5x3.1 (in/yd) (Generic) 1 x Per Day/30 Days Discharge Instructions: Secure with Kerlix as directed. Secured With: Paper Tape, 2x10 (in/yd) (Generic) 1 x Per Day/30 Days Discharge Instructions: Secure dressing with tape as directed. [name], [name] ([id number]) [id number] Page 5 of 10 Wound #2 - Ankle Wound Laterality: Left, Medial Cleanser: Soap and Water 1 x Per Day/30 Days Discharge Instructions: May shower and wash wound with  Service: [name] 06/24/2023 3:15 PM Medical Record Number: [id number] Patient Account Number: [id number] Date of Birth/Sex: Treating RN: [birth year] (32 y.o. M) Primary Care Provider: Jannetta Pineda ULT, [name] Other Clinician: Referring Provider: Treating Provider/Extender: [name] in Treatment: 4 Constitutional Hypertensive, asymptomatic. . . . no acute distress. Respiratory Normal work of breathing on room air. Notes 06/24/2023: The wounds continue to epithelialize. He does accumulate a bit of slough on the open surfaces but the medial ankle wound is nearly closed. They are far less painful than they have been, as well. Electronic Signature(s) Signed: 06/24/2023 3:52:28 PM By: Travis Guess MD FACS Entered By: [name] on 06/24/2023 12:52:28 [name] ([id number]) [id number] Page 4 of 10 -------------------------------------------------------------------------------- Physician Orders Details Patient Name: Date of Service: [name] 06/24/2023 3:15 PM Medical Record Number: [id number] Patient Account Number:  [id number] Date of Birth/Sex: Treating RN: 12/05/1990 (32 y.o. 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 - Dr Lady Gary RM 3 - pt has appt. Return A Anesthetic Wound #1 Left,Lateral Lower Leg (In clinic) Topical Lidocaine 5% applied to wound bed (In clinic) Topical Lidocaine 4% applied to wound bed - May also buy over the counter and use with other treatments Wound #2 Left,Medial Ankle (In clinic) Topical Lidocaine 5% applied to wound bed (In clinic) Topical Lidocaine 4% applied to wound bed Bathing/ Shower/ Hygiene May shower and wash wound with soap and water. Off-Loading Crutches for Offloading - Please use two crutches for offloading. Additional Orders / Instructions Other: - May purchase hydrocortisone cream to use on periwound area at home. Wound Treatment Wound #1 - Lower Leg Wound Laterality: Left, Lateral Cleanser: Soap and Water 1 x Per Day/30 Days Discharge Instructions: May shower and wash wound with dial antibacterial soap and water prior to dressing change. Cleanser: Vashe 5.8 (oz) 1 x Per Day/30 Days Discharge Instructions: Cleanse the wound with Vashe prior to applying a clean dressing using gauze sponges, not tissue or cotton balls. Peri-Wound Care: Silver Sulfadiazine Cream 1%, 25 (g), tube (Generic) 1 x Per Day/30 Days Topical: Triamcinolone 1 x Per Day/30 Days Discharge Instructions: Periwound - Apply Triamcinolone as  directed Prim Dressing: Hydrofera Blue Ready Transfer Foam, 4x5 (in/in) (DME) (Generic) 1 x Per Day/30 Days ary Discharge Instructions: Apply to wound bed as instructed Secondary Dressing: Zetuvit Plus Silicone Border Dressing 5x5 (in/in) (Dispense As Written) 1 x Per Day/30 Days Discharge Instructions: Apply silicone border over primary dressing as directed. Secured With: Elastic Bandage 4 inch (ACE bandage) (Generic) 1 x Per Day/30 Days Discharge Instructions: Secure with ACE bandage as directed. Secured With: [employer], 4.5x3.1 (in/yd) (Generic) 1 x Per Day/30 Days Discharge Instructions: Secure with Kerlix as directed. Secured With: Paper Tape, 2x10 (in/yd) (Generic) 1 x Per Day/30 Days Discharge Instructions: Secure dressing with tape as directed. [name], [name] ([id number]) [id number] Page 5 of 10 Wound #2 - Ankle Wound Laterality: Left, Medial Cleanser: Soap and Water 1 x Per Day/30 Days Discharge Instructions: May shower and wash wound with  Service: [name] 06/24/2023 3:15 PM Medical Record Number: [id number] Patient Account Number: [id number] Date of Birth/Sex: Treating RN: [birth year] (32 y.o. M) Primary Care Provider: Jannetta Pineda ULT, [name] Other Clinician: Referring Provider: Treating Provider/Extender: [name] in Treatment: 4 Constitutional Hypertensive, asymptomatic. . . . no acute distress. Respiratory Normal work of breathing on room air. Notes 06/24/2023: The wounds continue to epithelialize. He does accumulate a bit of slough on the open surfaces but the medial ankle wound is nearly closed. They are far less painful than they have been, as well. Electronic Signature(s) Signed: 06/24/2023 3:52:28 PM By: Travis Guess MD FACS Entered By: [name] on 06/24/2023 12:52:28 [name] ([id number]) [id number] Page 4 of 10 -------------------------------------------------------------------------------- Physician Orders Details Patient Name: Date of Service: [name] 06/24/2023 3:15 PM Medical Record Number: [id number] Patient Account Number:  [id number] Date of Birth/Sex: Treating RN: 12/05/1990 (32 y.o. 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 - Dr Lady Gary RM 3 - pt has appt. Return A Anesthetic Wound #1 Left,Lateral Lower Leg (In clinic) Topical Lidocaine 5% applied to wound bed (In clinic) Topical Lidocaine 4% applied to wound bed - May also buy over the counter and use with other treatments Wound #2 Left,Medial Ankle (In clinic) Topical Lidocaine 5% applied to wound bed (In clinic) Topical Lidocaine 4% applied to wound bed Bathing/ Shower/ Hygiene May shower and wash wound with soap and water. Off-Loading Crutches for Offloading - Please use two crutches for offloading. Additional Orders / Instructions Other: - May purchase hydrocortisone cream to use on periwound area at home. Wound Treatment Wound #1 - Lower Leg Wound Laterality: Left, Lateral Cleanser: Soap and Water 1 x Per Day/30 Days Discharge Instructions: May shower and wash wound with dial antibacterial soap and water prior to dressing change. Cleanser: Vashe 5.8 (oz) 1 x Per Day/30 Days Discharge Instructions: Cleanse the wound with Vashe prior to applying a clean dressing using gauze sponges, not tissue or cotton balls. Peri-Wound Care: Silver Sulfadiazine Cream 1%, 25 (g), tube (Generic) 1 x Per Day/30 Days Topical: Triamcinolone 1 x Per Day/30 Days Discharge Instructions: Periwound - Apply Triamcinolone as  directed Prim Dressing: Hydrofera Blue Ready Transfer Foam, 4x5 (in/in) (DME) (Generic) 1 x Per Day/30 Days ary Discharge Instructions: Apply to wound bed as instructed Secondary Dressing: Zetuvit Plus Silicone Border Dressing 5x5 (in/in) (Dispense As Written) 1 x Per Day/30 Days Discharge Instructions: Apply silicone border over primary dressing as directed. Secured With: Elastic Bandage 4 inch (ACE bandage) (Generic) 1 x Per Day/30 Days Discharge Instructions: Secure with ACE bandage as directed. Secured With: [employer], 4.5x3.1 (in/yd) (Generic) 1 x Per Day/30 Days Discharge Instructions: Secure with Kerlix as directed. Secured With: Paper Tape, 2x10 (in/yd) (Generic) 1 x Per Day/30 Days Discharge Instructions: Secure dressing with tape as directed. [name], [name] ([id number]) [id number] Page 5 of 10 Wound #2 - Ankle Wound Laterality: Left, Medial Cleanser: Soap and Water 1 x Per Day/30 Days Discharge Instructions: May shower and wash wound with  dial antibacterial soap and water prior to dressing change. Cleanser: Vashe 5.8 (oz) (Generic) 1 x Per Day/30 Days Discharge Instructions: Cleanse the wound with Vashe prior to applying a clean dressing using gauze sponges, not tissue or cotton balls. Peri-Wound Care: Silver Sulfadiazine Cream 1%, 25 (g), tube (Generic) 1 x Per Day/30 Days Topical: Triamcinolone 1 x Per Day/30 Days Discharge Instructions: Periwound - Apply Triamcinolone as directed Prim Dressing: Hydrofera Blue Ready Transfer Foam, 4x5 (in/in) (DME) (Generic) 1 x Per Day/30 Days ary Discharge Instructions: Apply to wound bed as instructed Secondary Dressing: Zetuvit Plus Silicone Border Dressing 4x4 (in/in) (Dispense As Written) 1 x Per Day/30 Days Discharge Instructions: Apply silicone border over primary dressing as directed. Secured With: Elastic Bandage 4 inch (ACE bandage) 1 x Per Day/30 Days Discharge Instructions: Secure with ACE  bandage as directed. Secured With: [employer], 4.5x3.1 (in/yd) 1 x Per Day/30 Days Discharge Instructions: Secure with Kerlix as directed. Secured With: 98M Medipore H Soft Cloth Surgical T ape, 4 x 10 (in/yd) 1 x Per Day/30 Days Discharge Instructions: Secure with tape as directed. Electronic Signature(s) Signed: 06/24/2023 4:07:14 PM By: [name] Signed: 06/25/2023 9:16:50 AM By: Travis Guess MD FACS Entered By: [name] on 06/24/2023 12:54:29 Prescription 06/24/2023 -------------------------------------------------------------------------------- Alona Bene MD Patient Name: Provider: 1991/02/24 [id number] Date of Birth: NPI#: [id number] Sex: DEA #: [phone] Phone #: License #: UPN: Patient Address: [address] Eligha Bridegroom [facility] [place] [address] , [address] [place], [place] [address] [phone] Allergies No Known Allergies Provider's Orders Crutches for Offloading - Please use two crutches for offloading. Hand Signature: Date(s): Electronic Signature(s) Signed: 06/24/2023 4:07:14 PM By: [name] Signed: 06/25/2023 9:16:50 AM By: Travis Guess MD FACS Entered By: [name] on 06/24/2023 12:54:29 [name] ([id number]) [id number] Page 6 of 10 -------------------------------------------------------------------------------- Problem List Details Patient Name: Date of Service: [name] 06/24/2023 3:15 PM Medical Record Number: [id number] Patient Account Number: [id number] Date of Birth/Sex: Treating RN: 1991-06-30 (32 y.o. [name] Primary Care Provider: Jannetta Pineda ULT, [name] Other Clinician: Referring Provider: Treating Provider/Extender: [name] in Treatment: 4 Active Problems ICD-10 Encounter Code Description Active Date MDM Diagnosis L97.322  Non-pressure chronic ulcer of left ankle with fat layer exposed 05/21/2023 No Yes I10 Essential (primary) hypertension 05/21/2023 No Yes N18.6 End stage renal disease 05/21/2023 No Yes Z99.2 Dependence on renal dialysis 05/21/2023 No Yes Inactive Problems Resolved Problems Electronic Signature(s) Signed: 06/24/2023 3:50:08 PM By: Travis Guess MD FACS Entered By: [name] on 06/24/2023 12:50:08 -------------------------------------------------------------------------------- Progress Note Details Patient Name: Date of Service: [name] 06/24/2023 3:15 PM Medical Record Number: [id number] Patient Account Number: [id number] Date of Birth/Sex: Treating RN: 12-May-1991 (32 y.o. 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 In July of this year, he was seen by podiatry for "cellulitis with an open wound on his left foot and ankle.". He apparently was given clindamycin and mupirocin by urgent care prior to his visit with Dr. Annamary Rummage. At that time, the wound was felt to be improving on the prescribed regimen. About 3  dial antibacterial soap and water prior to dressing change. Cleanser: Vashe 5.8 (oz) (Generic) 1 x Per Day/30 Days Discharge Instructions: Cleanse the wound with Vashe prior to applying a clean dressing using gauze sponges, not tissue or cotton balls. Peri-Wound Care: Silver Sulfadiazine Cream 1%, 25 (g), tube (Generic) 1 x Per Day/30 Days Topical: Triamcinolone 1 x Per Day/30 Days Discharge Instructions: Periwound - Apply Triamcinolone as directed Prim Dressing: Hydrofera Blue Ready Transfer Foam, 4x5 (in/in) (DME) (Generic) 1 x Per Day/30 Days ary Discharge Instructions: Apply to wound bed as instructed Secondary Dressing: Zetuvit Plus Silicone Border Dressing 4x4 (in/in) (Dispense As Written) 1 x Per Day/30 Days Discharge Instructions: Apply silicone border over primary dressing as directed. Secured With: Elastic Bandage 4 inch (ACE bandage) 1 x Per Day/30 Days Discharge Instructions: Secure with ACE  bandage as directed. Secured With: [employer], 4.5x3.1 (in/yd) 1 x Per Day/30 Days Discharge Instructions: Secure with Kerlix as directed. Secured With: 98M Medipore H Soft Cloth Surgical T ape, 4 x 10 (in/yd) 1 x Per Day/30 Days Discharge Instructions: Secure with tape as directed. Electronic Signature(s) Signed: 06/24/2023 4:07:14 PM By: [name] Signed: 06/25/2023 9:16:50 AM By: Travis Guess MD FACS Entered By: [name] on 06/24/2023 12:54:29 Prescription 06/24/2023 -------------------------------------------------------------------------------- Alona Bene MD Patient Name: Provider: 1991/02/24 [id number] Date of Birth: NPI#: [id number] Sex: DEA #: [phone] Phone #: License #: UPN: Patient Address: [address] Eligha Bridegroom [facility] [place] [address] , [address] [place], [place] [address] [phone] Allergies No Known Allergies Provider's Orders Crutches for Offloading - Please use two crutches for offloading. Hand Signature: Date(s): Electronic Signature(s) Signed: 06/24/2023 4:07:14 PM By: [name] Signed: 06/25/2023 9:16:50 AM By: Travis Guess MD FACS Entered By: [name] on 06/24/2023 12:54:29 [name] ([id number]) [id number] Page 6 of 10 -------------------------------------------------------------------------------- Problem List Details Patient Name: Date of Service: [name] 06/24/2023 3:15 PM Medical Record Number: [id number] Patient Account Number: [id number] Date of Birth/Sex: Treating RN: 1991-06-30 (32 y.o. [name] Primary Care Provider: Jannetta Pineda ULT, [name] Other Clinician: Referring Provider: Treating Provider/Extender: [name] in Treatment: 4 Active Problems ICD-10 Encounter Code Description Active Date MDM Diagnosis L97.322  Non-pressure chronic ulcer of left ankle with fat layer exposed 05/21/2023 No Yes I10 Essential (primary) hypertension 05/21/2023 No Yes N18.6 End stage renal disease 05/21/2023 No Yes Z99.2 Dependence on renal dialysis 05/21/2023 No Yes Inactive Problems Resolved Problems Electronic Signature(s) Signed: 06/24/2023 3:50:08 PM By: Travis Guess MD FACS Entered By: [name] on 06/24/2023 12:50:08 -------------------------------------------------------------------------------- Progress Note Details Patient Name: Date of Service: [name] 06/24/2023 3:15 PM Medical Record Number: [id number] Patient Account Number: [id number] Date of Birth/Sex: Treating RN: 12-May-1991 (32 y.o. 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 In July of this year, he was seen by podiatry for "cellulitis with an open wound on his left foot and ankle.". He apparently was given clindamycin and mupirocin by urgent care prior to his visit with Dr. Annamary Rummage. At that time, the wound was felt to be improving on the prescribed regimen. About 3  [name], [name] ([id number]) [id number] Page 1 of 10 Visit Report for 06/24/2023 Chief Complaint Document Details Patient Name: Date of Service: [name] 06/24/2023 3:15 PM Medical Record Number: [id number] Patient Account Number: [id number] Date of Birth/Sex: Treating RN: Jun 06, 1991 (32 y.o. 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 [name] Primary Care Provider: Jannetta Pineda ULT, PRO [name] Other Clinician: Referring Provider: Treating Provider/Extender: [name] in Treatment: 4 Debridement Performed for Assessment: Wound #1 Left,Lateral Lower Leg Performed By: Physician Travis Guess, MD The following information was scribed by: [name] The information was scribed for: [name] Debridement Type: Debridement Level of Consciousness (Pre-procedure): Awake and Alert Pre-procedure Verification/Time Out Yes - 15:36 Taken: Start Time: 15:37 Pain Control: Lidocaine 4% Topical Solution Percent of Wound Bed Debrided: 100% T Area Debrided (cm): otal 19.78 Tissue and other material debrided: Slough, Slough Level: Non-Viable Tissue Debridement Description: Selective/Open Wound Instrument: Curette Bleeding:  Minimum Hemostasis Achieved: Pressure Response to Treatment: Procedure was tolerated well Level of Consciousness (Post- Awake and Alert procedure): Post Debridement Measurements of Total Wound Length: (cm) 4.2 Width: (cm) 6 Depth: (cm) 0.2 Volume: (cm) 3.958 Character of Wound/Ulcer Post Debridement: Improved Post Procedure Diagnosis [name] ([id number]) [id number] Page 2 of 10 Same as Pre-procedure Electronic Signature(s) Signed: 06/24/2023 4:07:14 PM By: [name] Signed: 06/25/2023 9:16:50 AM By: Travis Guess MD FACS Entered By: [name] on 06/24/2023 12:37:24 -------------------------------------------------------------------------------- Debridement Details Patient Name: Date of Service: [name] 06/24/2023 3:15 PM Medical Record Number: [id number] Patient Account Number: [id number] Date of Birth/Sex: Treating RN: 1990-09-28 (32 y.o. [name] Primary Care Provider: Jannetta Pineda ULT, PRO [name] Other Clinician: Referring Provider: Treating Provider/Extender: [name] in Treatment: 4 Debridement Performed for Assessment: Wound #2 Left,Medial Ankle Performed By: Physician Travis Guess, MD Debridement Type: Debridement Level of Consciousness (Pre-procedure): Awake and Alert Pre-procedure Verification/Time Out Yes - 15:36 Taken: Start Time: 15:37 Pain Control: Lidocaine 4% Topical Solution Percent of Wound Bed Debrided: 100% T Area Debrided (cm): otal 3.53 Tissue and other material debrided: Slough, Slough Level: Non-Viable Tissue Debridement Description: Selective/Open Wound Instrument: Curette Bleeding: Minimum Hemostasis Achieved: Pressure Response to Treatment: Procedure was tolerated well Level of Consciousness (Post- Awake and Alert procedure): Post Debridement Measurements of Total Wound Length: (cm) 3 Width: (cm) 1.5 Depth: (cm) 0.1 Volume: (cm)  0.353 Character of Wound/Ulcer Post Debridement: Improved Post Procedure Diagnosis Same as Pre-procedure Electronic Signature(s) Signed: 06/24/2023 4:07:14 PM By: [name] Signed: 06/25/2023 9:16:50 AM By: Travis Guess MD FACS Entered By: [name] on 06/24/2023 12:37:58 -------------------------------------------------------------------------------- HPI Details Patient Name: Date of Service: [name] 06/24/2023 3:15 PM Medical Record Number: [id number] Patient Account Number: [id number] Date of Birth/Sex: Treating RN: 1991-07-14 (32 y.o. M) Primary Care Provider: Marlin Canary, [name] Other Clinician: Referring Provider: Treating Provider/Extender: [name] in Treatment: 4 [place], [name] ([id number]) [id number] Page 3 of 10 History of Present Illness HPI  Service: [name] 06/24/2023 3:15 PM Medical Record Number: [id number] Patient Account Number: [id number] Date of Birth/Sex: Treating RN: [birth year] (32 y.o. M) Primary Care Provider: Jannetta Pineda ULT, [name] Other Clinician: Referring Provider: Treating Provider/Extender: [name] in Treatment: 4 Constitutional Hypertensive, asymptomatic. . . . no acute distress. Respiratory Normal work of breathing on room air. Notes 06/24/2023: The wounds continue to epithelialize. He does accumulate a bit of slough on the open surfaces but the medial ankle wound is nearly closed. They are far less painful than they have been, as well. Electronic Signature(s) Signed: 06/24/2023 3:52:28 PM By: Travis Guess MD FACS Entered By: [name] on 06/24/2023 12:52:28 [name] ([id number]) [id number] Page 4 of 10 -------------------------------------------------------------------------------- Physician Orders Details Patient Name: Date of Service: [name] 06/24/2023 3:15 PM Medical Record Number: [id number] Patient Account Number:  [id number] Date of Birth/Sex: Treating RN: 12/05/1990 (32 y.o. 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 - Dr Lady Gary RM 3 - pt has appt. Return A Anesthetic Wound #1 Left,Lateral Lower Leg (In clinic) Topical Lidocaine 5% applied to wound bed (In clinic) Topical Lidocaine 4% applied to wound bed - May also buy over the counter and use with other treatments Wound #2 Left,Medial Ankle (In clinic) Topical Lidocaine 5% applied to wound bed (In clinic) Topical Lidocaine 4% applied to wound bed Bathing/ Shower/ Hygiene May shower and wash wound with soap and water. Off-Loading Crutches for Offloading - Please use two crutches for offloading. Additional Orders / Instructions Other: - May purchase hydrocortisone cream to use on periwound area at home. Wound Treatment Wound #1 - Lower Leg Wound Laterality: Left, Lateral Cleanser: Soap and Water 1 x Per Day/30 Days Discharge Instructions: May shower and wash wound with dial antibacterial soap and water prior to dressing change. Cleanser: Vashe 5.8 (oz) 1 x Per Day/30 Days Discharge Instructions: Cleanse the wound with Vashe prior to applying a clean dressing using gauze sponges, not tissue or cotton balls. Peri-Wound Care: Silver Sulfadiazine Cream 1%, 25 (g), tube (Generic) 1 x Per Day/30 Days Topical: Triamcinolone 1 x Per Day/30 Days Discharge Instructions: Periwound - Apply Triamcinolone as  directed Prim Dressing: Hydrofera Blue Ready Transfer Foam, 4x5 (in/in) (DME) (Generic) 1 x Per Day/30 Days ary Discharge Instructions: Apply to wound bed as instructed Secondary Dressing: Zetuvit Plus Silicone Border Dressing 5x5 (in/in) (Dispense As Written) 1 x Per Day/30 Days Discharge Instructions: Apply silicone border over primary dressing as directed. Secured With: Elastic Bandage 4 inch (ACE bandage) (Generic) 1 x Per Day/30 Days Discharge Instructions: Secure with ACE bandage as directed. Secured With: [employer], 4.5x3.1 (in/yd) (Generic) 1 x Per Day/30 Days Discharge Instructions: Secure with Kerlix as directed. Secured With: Paper Tape, 2x10 (in/yd) (Generic) 1 x Per Day/30 Days Discharge Instructions: Secure dressing with tape as directed. [name], [name] ([id number]) [id number] Page 5 of 10 Wound #2 - Ankle Wound Laterality: Left, Medial Cleanser: Soap and Water 1 x Per Day/30 Days Discharge Instructions: May shower and wash wound with  dial antibacterial soap and water prior to dressing change. Cleanser: Vashe 5.8 (oz) (Generic) 1 x Per Day/30 Days Discharge Instructions: Cleanse the wound with Vashe prior to applying a clean dressing using gauze sponges, not tissue or cotton balls. Peri-Wound Care: Silver Sulfadiazine Cream 1%, 25 (g), tube (Generic) 1 x Per Day/30 Days Topical: Triamcinolone 1 x Per Day/30 Days Discharge Instructions: Periwound - Apply Triamcinolone as directed Prim Dressing: Hydrofera Blue Ready Transfer Foam, 4x5 (in/in) (DME) (Generic) 1 x Per Day/30 Days ary Discharge Instructions: Apply to wound bed as instructed Secondary Dressing: Zetuvit Plus Silicone Border Dressing 4x4 (in/in) (Dispense As Written) 1 x Per Day/30 Days Discharge Instructions: Apply silicone border over primary dressing as directed. Secured With: Elastic Bandage 4 inch (ACE bandage) 1 x Per Day/30 Days Discharge Instructions: Secure with ACE  bandage as directed. Secured With: [employer], 4.5x3.1 (in/yd) 1 x Per Day/30 Days Discharge Instructions: Secure with Kerlix as directed. Secured With: 98M Medipore H Soft Cloth Surgical T ape, 4 x 10 (in/yd) 1 x Per Day/30 Days Discharge Instructions: Secure with tape as directed. Electronic Signature(s) Signed: 06/24/2023 4:07:14 PM By: [name] Signed: 06/25/2023 9:16:50 AM By: Travis Guess MD FACS Entered By: [name] on 06/24/2023 12:54:29 Prescription 06/24/2023 -------------------------------------------------------------------------------- Alona Bene MD Patient Name: Provider: 1991/02/24 [id number] Date of Birth: NPI#: [id number] Sex: DEA #: [phone] Phone #: License #: UPN: Patient Address: [address] Eligha Bridegroom [facility] [place] [address] , [address] [place], [place] [address] [phone] Allergies No Known Allergies Provider's Orders Crutches for Offloading - Please use two crutches for offloading. Hand Signature: Date(s): Electronic Signature(s) Signed: 06/24/2023 4:07:14 PM By: [name] Signed: 06/25/2023 9:16:50 AM By: Travis Guess MD FACS Entered By: [name] on 06/24/2023 12:54:29 [name] ([id number]) [id number] Page 6 of 10 -------------------------------------------------------------------------------- Problem List Details Patient Name: Date of Service: [name] 06/24/2023 3:15 PM Medical Record Number: [id number] Patient Account Number: [id number] Date of Birth/Sex: Treating RN: 1991-06-30 (32 y.o. [name] Primary Care Provider: Jannetta Pineda ULT, [name] Other Clinician: Referring Provider: Treating Provider/Extender: [name] in Treatment: 4 Active Problems ICD-10 Encounter Code Description Active Date MDM Diagnosis L97.322  Non-pressure chronic ulcer of left ankle with fat layer exposed 05/21/2023 No Yes I10 Essential (primary) hypertension 05/21/2023 No Yes N18.6 End stage renal disease 05/21/2023 No Yes Z99.2 Dependence on renal dialysis 05/21/2023 No Yes Inactive Problems Resolved Problems Electronic Signature(s) Signed: 06/24/2023 3:50:08 PM By: Travis Guess MD FACS Entered By: [name] on 06/24/2023 12:50:08 -------------------------------------------------------------------------------- Progress Note Details Patient Name: Date of Service: [name] 06/24/2023 3:15 PM Medical Record Number: [id number] Patient Account Number: [id number] Date of Birth/Sex: Treating RN: 12-May-1991 (32 y.o. 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 In July of this year, he was seen by podiatry for "cellulitis with an open wound on his left foot and ankle.". He apparently was given clindamycin and mupirocin by urgent care prior to his visit with Dr. Annamary Rummage. At that time, the wound was felt to be improving on the prescribed regimen. About 3

## 2023-06-25 NOTE — Progress Notes (Signed)
4 4 N/A Weeks of Treatment: Open Open N/A Wound Status: No No N/A Wound Recurrence: 4.2x6x0.2 3x1.5x0.1 N/A Measurements L x W x D (cm) 19.792 3.534 N/A A (cm) : rea 3.958 0.353 N/A Volume (cm) : 52.00% -350.20% N/A % Reduction in A rea: 52.00% -346.80% N/A % Reduction in Volume: Full Thickness Without Exposed Full Thickness Without Exposed N/A Classification: Support Structures Support Structures Medium Small N/A Exudate A mount: Serosanguineous Serosanguineous N/A Exudate Type: red, brown red, brown N/A Exudate Color: Distinct, outline attached Distinct, outline attached N/A Wound Margin: Medium (34-66%) Small (1-33%) N/A Granulation A mount: Red, Pink Red, Pink N/A Granulation Quality: Medium (34-66%) Large (67-100%) N/A Necrotic A mount: Fat Layer (Subcutaneous Tissue): Yes Fat Layer (Subcutaneous Tissue): Yes N/A Exposed Structures: Medium (34-66%) Medium (34-66%) N/A Epithelialization: Debridement - Selective/Open Wound Debridement - Selective/Open Wound N/A Debridement: Pre-procedure Verification/Time Out 15:36 15:36  N/A Taken: Lidocaine 4% Topical Solution Lidocaine 4% Topical Solution N/A Pain Control: [employer] N/A Tissue Debrided: Non-Viable Tissue Non-Viable Tissue N/A Level: 19.78 3.53 N/A Debridement A (sq cm): rea Curette Curette N/A Instrument: Minimum Minimum N/A Bleeding: Pressure Pressure N/A Hemostasis A chieved: Procedure was tolerated well Procedure was tolerated well N/A Debridement Treatment Response: 4.2x6x0.2 3x1.5x0.1 N/A Post Debridement Measurements L x W x D (cm) 3.958 0.353 N/A Post Debridement Volume: (cm) Scarring: Yes No Abnormalities Noted N/A Periwound Skin Texture: Maceration: No No Abnormalities Noted N/A Periwound Skin Moisture: Dry/Scaly: No Hemosiderin Staining: No No Abnormalities Noted N/A Periwound Skin Color: No Abnormality No Abnormality N/A Temperature: Yes Yes N/A Tenderness on Palpation: Debridement Debridement N/A Procedures Performed: Treatment Notes Electronic Signature(s) Signed: 06/24/2023 3:50:16 PM By: [name] Entered By: Duanne Guess on 06/24/2023 12:50:16 -------------------------------------------------------------------------------- Multi-Disciplinary Care Plan Details Patient Name: Date of Service: CA RTA [name] 06/24/2023 3:15 PM Medical Record Number: [id number] Patient Account Number: [id number] LATEEF, JUNCAJ ([id number]) [id number].pdf Page 4 of 9 Date of Birth/Sex: Treating RN: [birth year] (32 y.o. [name] Primary Care Israa Caban: [name], PRO [name] Other Clinician: Referring Barbaraann Avans: Treating Jeovanni Heuring/Extender: [name] in Treatment: 4 Multidisciplinary Care Plan reviewed with physician Active Inactive Venous Leg Ulcer Nursing Diagnoses: Knowledge deficit related to disease process and management Potential for venous Insuffiency (use before diagnosis confirmed) Goals: Patient will maintain optimal edema  control Date Initiated: 06/12/2023 Target Resolution Date: 07/10/2023 Goal Status: Active Interventions: Assess peripheral edema status every visit. Compression as ordered Provide education on venous insufficiency Treatment Activities: Therapeutic compression applied : 06/12/2023 Notes: Wound/Skin Impairment Nursing Diagnoses: Knowledge deficit related to smoking impact on wound healing Goals: Patient/caregiver will verbalize understanding of skin care regimen Date Initiated: 05/21/2023 Target Resolution Date: 07/09/2023 Goal Status: Active Interventions: Assess patient/caregiver ability to obtain necessary supplies Assess patient/caregiver ability to perform ulcer/skin care regimen upon admission and as needed Assess ulceration(s) every visit Provide education on ulcer and skin care Screen for HBO Treatment Activities: Skin care regimen initiated : 05/21/2023 Topical wound management initiated : 05/21/2023 Notes: Electronic Signature(s) Signed: 06/24/2023 4:07:14 PM By: Brenton Grills Entered By: Brenton Grills on 06/24/2023 12:27:45 -------------------------------------------------------------------------------- Pain Assessment Details Patient Name: Date of Service: CA RTA Ardean Larsen 06/24/2023 3:15 PM Medical Record Number: [id number] Patient Account Number: [id number] Date of Birth/Sex: Treating RN: 1991-07-11 (32 y.o. M) Primary Care Shiquan Mathieu: Marlin Canary, [name] Other Clinician: Referring Jotham Ahn: Treating Suzette Flagler/Extender: [name] in Treatment: 4 [place], [name] ([id number]) [id number] Page 5 of 9 Active Problems  Location of Pain Severity and Description of Pain Patient Has Paino No Site Locations Pain Management and Medication Current Pain Management: Electronic Signature(s) Signed: 06/25/2023 9:39:23 AM By: Dayton Scrape Entered By: Dayton Scrape on 06/24/2023  11:39:58 -------------------------------------------------------------------------------- Patient/Caregiver Education Details Patient Name: Date of Service: CA RTA Ardean Larsen 10/16/2024andnbsp3:15 PM Medical Record Number: [id number] Patient Account Number: [id number] Date of Birth/Gender: Treating RN: [birth year] (32 y.o. [name] Primary Care Physician: [name], [name] Other Clinician: Referring Physician: Treating Physician/Extender: [name] in Treatment: 4 Education Assessment Education Provided To: Patient Education Topics Provided Wound/Skin Impairment: Methods: Explain/Verbal Responses: State content correctly Electronic Signature(s) Signed: 06/24/2023 4:07:14 PM By: Brenton Grills Entered By: Brenton Grills on 06/24/2023 12:28:10 [name] ([id number]) [id number] Page 6 of 9 -------------------------------------------------------------------------------- Wound Assessment Details Patient Name: Date of Service: CA RTA Ardean Larsen 06/24/2023 3:15 PM Medical Record Number: [id number] Patient Account Number: [id number] Date of Birth/Sex: Treating RN: 1991-07-04 (32 y.o. M) Primary Care Mads Borgmeyer: [name], [name] Other Clinician: Referring Darcelle Herrada: Treating Dorothyann Mourer/Extender: [name] in Treatment: 4 Wound Status Wound Number: 1 Primary Etiology: Calciphylaxis Wound Location: Left, Lateral Lower Leg Wound Status: Open Wounding Event: Bite Comorbid History: Hypertension, End Stage Renal Disease Date Acquired: 03/09/2023 Weeks Of Treatment: 4 Clustered Wound: No Photos Wound Measurements Length: (cm) 4.2 Width: (cm) 6 Depth: (cm) 0.2 Area: (cm) 19.792 Volume: (cm) 3.958 % Reduction in Area: 52% % Reduction in Volume: 52% Epithelialization: Medium (34-66%) Wound Description Classification: Full Thickness Without Exposed Support  Structures Wound Margin: Distinct, outline attached Exudate Amount: Medium Exudate Type: Serosanguineous Exudate Color: red, brown Foul Odor After Cleansing: No Slough/Fibrino Yes Wound Bed Granulation Amount: Medium (34-66%) Exposed Structure Granulation Quality: Red, Pink Fat Layer (Subcutaneous Tissue) Exposed: Yes Necrotic Amount: Medium (34-66%) Necrotic Quality: Adherent Slough Periwound Skin Texture Texture Color No Abnormalities Noted: No No Abnormalities Noted: Yes Scarring: Yes Temperature / Pain Temperature: No Abnormality Moisture No Abnormalities Noted: No Tenderness on Palpation: Yes Dry / Scaly: No Maceration: No Treatment Notes Wound #1 (Lower Leg) Wound Laterality: Left, Lateral Cleanser Soap and Water Discharge Instruction: May shower and wash wound with dial antibacterial soap and water prior to dressing change. Vashe 5.8 (oz) Discharge Instruction: Cleanse the wound with Vashe prior to applying a clean dressing using gauze sponges, not tissue or cotton balls. Peri-Wound Care Silver Sulfadiazine Cream 1%, 25 (g), tube [place], [name] ([id number]) [id number] Page 7 of 9 Topical Triamcinolone Discharge Instruction: Periwound - Apply Triamcinolone as directed Primary Dressing Hydrofera Blue Ready Transfer Foam, 4x5 (in/in) Discharge Instruction: Apply to wound bed as instructed Secondary Dressing Zetuvit Plus Silicone Border Dressing 5x5 (in/in) Discharge Instruction: Apply silicone border over primary dressing as directed. Secured With Elastic Bandage 4 inch (ACE bandage) Discharge Instruction: Secure with ACE bandage as directed. Kerlix Roll Sterile, 4.5x3.1 (in/yd) Discharge Instruction: Secure with Kerlix as directed. Paper Tape, 2x10 (in/yd) Discharge Instruction: Secure dressing with tape as directed. Compression Wrap Compression Stockings Add-Ons Electronic Signature(s) Signed: 06/25/2023 9:39:23 AM By: Dayton Scrape Entered By: Dayton Scrape on 06/24/2023 11:47:28 -------------------------------------------------------------------------------- Wound Assessment Details Patient Name: Date of Service: CA RTA Ardean Larsen 06/24/2023 3:15 PM Medical Record Number: [id number] Patient Account Number: [id number] Date of Birth/Sex: Treating RN: 1991-03-31 (32 y.o. M) Primary Care Lucia Mccreadie: [name], [name] Other Clinician: Referring Angelissa Supan: Treating Mouhamadou Gittleman/Extender: [name] in Treatment: 4 Wound Status Wound Number: 2 Primary Etiology:  Calciphylaxis Wound Location: Left, Medial Ankle Wound Status: Open Wounding Event: Bite Comorbid History: Hypertension, End Stage Renal Disease Date Acquired: 03/09/2023 Weeks Of Treatment: 4 Clustered Wound: No Photos Wound Measurements Length: (cm) 3 Width: (cm) 1.5 Depth: (cm) 0.1 Area: (cm) 3.534 Mounts, Sadarius ([id number]) Volume: (cm) 0.353 % Reduction in Area: -350.2% % Reduction in Volume: -346.8% Epithelialization: Medium (34-66%) [id number] Page 8 of 9 Wound Description Classification: Full Thickness Without Exposed Support Structures Wound Margin: Distinct, outline attached Exudate Amount: Small Exudate Type: Serosanguineous Exudate Color: red, brown Foul Odor After Cleansing: No Slough/Fibrino Yes Wound Bed Granulation Amount: Small (1-33%) Exposed Structure Granulation Quality: Red, Pink Fat Layer (Subcutaneous Tissue) Exposed: Yes Necrotic Amount: Large (67-100%) Necrotic Quality: Adherent Slough Periwound Skin Texture Texture Color No Abnormalities Noted: Yes No Abnormalities Noted: Yes Moisture Temperature / Pain No Abnormalities Noted: Yes Temperature: No Abnormality Tenderness on Palpation: Yes Treatment Notes Wound #2 (Ankle) Wound Laterality: Left, Medial Cleanser Soap and Water Discharge Instruction: May shower and wash wound with dial antibacterial  soap and water prior to dressing change. Vashe 5.8 (oz) Discharge Instruction: Cleanse the wound with Vashe prior to applying a clean dressing using gauze sponges, not tissue or cotton balls. Peri-Wound Care Silver Sulfadiazine Cream 1%, 25 (g), tube Topical Triamcinolone Discharge Instruction: Periwound - Apply Triamcinolone as directed Primary Dressing Hydrofera Blue Ready Transfer Foam, 4x5 (in/in) Discharge Instruction: Apply to wound bed as instructed Secondary Dressing Zetuvit Plus Silicone Border Dressing 4x4 (in/in) Discharge Instruction: Apply silicone border over primary dressing as directed. Secured With Elastic Bandage 4 inch (ACE bandage) Discharge Instruction: Secure with ACE bandage as directed. Kerlix Roll Sterile, 4.5x3.1 (in/yd) Discharge Instruction: Secure with Kerlix as directed. 84M Medipore H Soft Cloth Surgical T ape, 4 x 10 (in/yd) Discharge Instruction: Secure with tape as directed. Compression Wrap Compression Stockings Add-Ons Electronic Signature(s) Signed: 06/25/2023 9:39:23 AM By: Dayton Scrape Entered By: Dayton Scrape on 06/24/2023 11:46:58 [name] ([id number]) [id number] Page 9 of 9 -------------------------------------------------------------------------------- Vitals Details Patient Name: Date of Service: CA RTA Ardean Larsen 06/24/2023 3:15 PM Medical Record Number: [id number] Patient Account Number: [id number] Date of Birth/Sex: Treating RN: April 03, 1991 (32 y.o. M) Primary Care Melah Ebling: [name], [name] Other Clinician: Referring Nimisha Rathel: Treating Amala Petion/Extender: [name] in Treatment: 4 Vital Signs Time Taken: 02:39 Pulse (bpm): 69 Height (in): 71 Respiratory Rate (breaths/min): 18 Weight (lbs): 184 Blood Pressure (mmHg): 152/90 Body Mass Index (BMI): 25.7 Reference Range: 80 - 120 mg / dl Electronic Signature(s) Signed: 06/25/2023 9:39:23 AM By: Dayton Scrape Entered By: Dayton Scrape on 06/24/2023 11:39:52  Calciphylaxis Wound Location: Left, Medial Ankle Wound Status: Open Wounding Event: Bite Comorbid History: Hypertension, End Stage Renal Disease Date Acquired: 03/09/2023 Weeks Of Treatment: 4 Clustered Wound: No Photos Wound Measurements Length: (cm) 3 Width: (cm) 1.5 Depth: (cm) 0.1 Area: (cm) 3.534 Mounts, Sadarius ([id number]) Volume: (cm) 0.353 % Reduction in Area: -350.2% % Reduction in Volume: -346.8% Epithelialization: Medium (34-66%) [id number] Page 8 of 9 Wound Description Classification: Full Thickness Without Exposed Support Structures Wound Margin: Distinct, outline attached Exudate Amount: Small Exudate Type: Serosanguineous Exudate Color: red, brown Foul Odor After Cleansing: No Slough/Fibrino Yes Wound Bed Granulation Amount: Small (1-33%) Exposed Structure Granulation Quality: Red, Pink Fat Layer (Subcutaneous Tissue) Exposed: Yes Necrotic Amount: Large (67-100%) Necrotic Quality: Adherent Slough Periwound Skin Texture Texture Color No Abnormalities Noted: Yes No Abnormalities Noted: Yes Moisture Temperature / Pain No Abnormalities Noted: Yes Temperature: No Abnormality Tenderness on Palpation: Yes Treatment Notes Wound #2 (Ankle) Wound Laterality: Left, Medial Cleanser Soap and Water Discharge Instruction: May shower and wash wound with dial antibacterial  soap and water prior to dressing change. Vashe 5.8 (oz) Discharge Instruction: Cleanse the wound with Vashe prior to applying a clean dressing using gauze sponges, not tissue or cotton balls. Peri-Wound Care Silver Sulfadiazine Cream 1%, 25 (g), tube Topical Triamcinolone Discharge Instruction: Periwound - Apply Triamcinolone as directed Primary Dressing Hydrofera Blue Ready Transfer Foam, 4x5 (in/in) Discharge Instruction: Apply to wound bed as instructed Secondary Dressing Zetuvit Plus Silicone Border Dressing 4x4 (in/in) Discharge Instruction: Apply silicone border over primary dressing as directed. Secured With Elastic Bandage 4 inch (ACE bandage) Discharge Instruction: Secure with ACE bandage as directed. Kerlix Roll Sterile, 4.5x3.1 (in/yd) Discharge Instruction: Secure with Kerlix as directed. 84M Medipore H Soft Cloth Surgical T ape, 4 x 10 (in/yd) Discharge Instruction: Secure with tape as directed. Compression Wrap Compression Stockings Add-Ons Electronic Signature(s) Signed: 06/25/2023 9:39:23 AM By: Dayton Scrape Entered By: Dayton Scrape on 06/24/2023 11:46:58 [name] ([id number]) [id number] Page 9 of 9 -------------------------------------------------------------------------------- Vitals Details Patient Name: Date of Service: CA RTA Ardean Larsen 06/24/2023 3:15 PM Medical Record Number: [id number] Patient Account Number: [id number] Date of Birth/Sex: Treating RN: April 03, 1991 (32 y.o. M) Primary Care Melah Ebling: [name], [name] Other Clinician: Referring Nimisha Rathel: Treating Amala Petion/Extender: [name] in Treatment: 4 Vital Signs Time Taken: 02:39 Pulse (bpm): 69 Height (in): 71 Respiratory Rate (breaths/min): 18 Weight (lbs): 184 Blood Pressure (mmHg): 152/90 Body Mass Index (BMI): 25.7 Reference Range: 80 - 120 mg / dl Electronic Signature(s) Signed: 06/25/2023 9:39:23 AM By: Dayton Scrape Entered By: Dayton Scrape on 06/24/2023 11:39:52

## 2023-07-01 ENCOUNTER — Ambulatory Visit (HOSPITAL_BASED_OUTPATIENT_CLINIC_OR_DEPARTMENT_OTHER): Payer: Medicare Other | Admitting: General Surgery

## 2023-07-14 ENCOUNTER — Encounter (HOSPITAL_BASED_OUTPATIENT_CLINIC_OR_DEPARTMENT_OTHER): Payer: Medicare Other | Attending: General Surgery | Admitting: General Surgery

## 2023-07-14 DIAGNOSIS — Z992 Dependence on renal dialysis: Secondary | ICD-10-CM | POA: Insufficient documentation

## 2023-07-14 DIAGNOSIS — L299 Pruritus, unspecified: Secondary | ICD-10-CM | POA: Diagnosis not present

## 2023-07-14 DIAGNOSIS — I120 Hypertensive chronic kidney disease with stage 5 chronic kidney disease or end stage renal disease: Secondary | ICD-10-CM | POA: Diagnosis not present

## 2023-07-14 DIAGNOSIS — L97322 Non-pressure chronic ulcer of left ankle with fat layer exposed: Secondary | ICD-10-CM | POA: Diagnosis present

## 2023-07-14 DIAGNOSIS — N186 End stage renal disease: Secondary | ICD-10-CM | POA: Diagnosis not present

## 2023-07-14 DIAGNOSIS — Z87891 Personal history of nicotine dependence: Secondary | ICD-10-CM | POA: Diagnosis not present

## 2023-07-15 NOTE — Progress Notes (Signed)
[place], [name] ([id number]) [id number] Page 1 of 9 Visit Report for 07/14/2023 Arrival Information Details Patient Name: Date of Service: [name] 07/14/2023 1:45 PM Medical Record Number: [id number] Patient Account Number: [id number] Date of Birth/Sex: Treating RN: [birth year] (32 y.o. [name] Primary Care Abdulai Blaylock: [name], PRO [name] Other Clinician: Referring Arjuna Doeden: Treating Hala Narula/Extender: [name] in Treatment: 7 Visit Information History Since Last Visit Added or deleted any medications: No Patient Arrived: Ambulatory Any new allergies or adverse reactions: No Arrival Time: 14:09 Had a fall or experienced change in No Accompanied By: self activities of daily living that may affect Transfer Assistance: None risk of falls: Patient Identification Verified: Yes Signs or symptoms of abuse/neglect since last visito No Patient Requires Transmission-Based Precautions: No Hospitalized since last visit: No Patient Has Alerts: No Implantable device outside of the clinic excluding No cellular tissue based products placed in the center since last visit: Has Dressing in Place as Prescribed: Yes Pain Present Now: Yes Electronic Signature(s) Signed: 07/14/2023 4:24:28 PM By: [name] Entered By: Karie Schwalbe on 07/14/2023 14:09:30 -------------------------------------------------------------------------------- Encounter Discharge Information Details Patient Name: Date of Service: [name] 07/14/2023 1:45 PM Medical Record Number: [id number] Patient Account Number: [id number] Date of Birth/Sex: Treating RN: 03-09-1991 (32 y.o. [name] Primary Care Tracye Szuch: [name], PRO [name] Other Clinician: Referring Ireoluwa Grant: Treating Hafiz Irion/Extender: [name] in Treatment: 7 Encounter Discharge Information Items Post Procedure  Vitals Discharge Condition: Stable Temperature (F): 97.9 Ambulatory Status: Ambulatory Pulse (bpm): 75 Discharge Destination: Home Respiratory Rate (breaths/min): 18 Transportation: Private Auto Blood Pressure (mmHg): 144/87 Accompanied By: self Schedule Follow-up Appointment: Yes Clinical Summary of Care: Patient Declined Electronic Signature(s) Signed: 07/14/2023 4:24:28 PM By: [name] Entered By: Karie Schwalbe on 07/14/2023 16:20:52 [name] ([id number]) [id number] Page 2 of 9 -------------------------------------------------------------------------------- Lower Extremity Assessment Details Patient Name: Date of Service: [name] 07/14/2023 1:45 PM Medical Record Number: [id number] Patient Account Number: [id number] Date of Birth/Sex: Treating RN: [birth year] (32 y.o. [name] Primary Care Yosselyn Tax: [name], PRO [name] Other Clinician: Referring Sawsan Riggio: Treating Tamyka Bezio/Extender: [name] in Treatment: 7 Edema Assessment Assessed: [Left: No] [Right: No] Edema: [Left: Ye] [Right: s] Calf Left: Right: Point of Measurement: From Medial Instep 38.5 cm Ankle Left: Right: Point of Measurement: From Medial Instep 24 cm Vascular Assessment Pulses: Dorsalis Pedis Palpable: [Left:Yes] Extremity colors, hair growth, and conditions: Extremity Color: [Left:Hyperpigmented] Hair Growth on Extremity: [Left:Yes] Temperature of Extremity: [Left:Warm] Dependent Rubor: [Left:No No] Electronic Signature(s) Signed: 07/14/2023 4:24:28 PM By: [name] Entered By: Karie Schwalbe on 07/14/2023 14:10:55 -------------------------------------------------------------------------------- Multi Wound Chart Details Patient Name: Date of Service: [name] 07/14/2023 1:45 PM Medical Record Number: [id number] Patient Account Number: [id number] Date of Birth/Sex: Treating  RN: 25-Feb-1991 (32 y.o. 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, End Stage Renal Hypertension, End Stage Renal Pineda/A Comorbid History: Disease Disease 03/09/2023 03/09/2023 Pineda/A Date Acquired: 7 7 Pineda/A Weeks of Treatment: Open Open Pineda/A Wound Status:  No No Pineda/A Wound Recurrence: 3.8x5x0.2 1.5x0.8x0.1 Pineda/A Measurements L x W x D (cm) 14.923 0.942 Pineda/A A (cm) : rea 2.985 0.094 Pineda/A Volume (cm) : 63.80% -20.00% Pineda/A % Reduction in A rea: 63.80% -19.00% Pineda/A % Reduction in Volume: Full Thickness Without Exposed Full Thickness Without Exposed Pineda/A Classification: Support Structures Support Structures Medium Small Pineda/A Exudate A mount: Serosanguineous Serosanguineous Pineda/A Exudate Type: red, brown red, brown Pineda/A Exudate Color: Distinct, outline attached Distinct, outline attached Pineda/A Wound Margin: Medium (34-66%) Small (1-33%) Pineda/A Granulation A mount: Red, Pink Red, Pink Pineda/A Granulation Quality: Medium (34-66%) Large (67-100%) Pineda/A Necrotic A mount: Eschar, Adherent Slough Eschar, Adherent Slough Pineda/A Necrotic Tissue: Fat Layer (Subcutaneous Tissue): Yes Fat Layer (Subcutaneous Tissue): Yes Pineda/A Exposed Structures: Medium (34-66%) Medium (34-66%) Pineda/A Epithelialization: Debridement - Excisional Debridement - Excisional Pineda/A Debridement: Pre-procedure Verification/Time Out 14:43 14:43 Pineda/A Taken: Lidocaine 4% Topical Solution Lidocaine 4% Topical Solution  Pineda/A Pain Control: Subcutaneous, Slough Necrotic/Eschar, Subcutaneous, Pineda/A Tissue Debrided: Slough Skin/Subcutaneous Tissue Skin/Subcutaneous Tissue Pineda/A Level: 14.91 0.94 Pineda/A Debridement A (sq cm): rea Curette Curette Pineda/A Instrument: Minimum Minimum Pineda/A Bleeding: Pressure Pressure Pineda/A Hemostasis Achieved: Procedure was tolerated well Procedure was tolerated well Pineda/A Debridement Treatment Response: 3.8x5x0.2 1.5x0.8x0.1 Pineda/A Post Debridement Measurements L x W x D (cm) 2.985 0.094 Pineda/A Post Debridement Volume: (cm) Scarring: Yes No Abnormalities Noted Pineda/A Periwound Skin Texture: Maceration: No No Abnormalities Noted Pineda/A Periwound Skin Moisture: Dry/Scaly: No Hemosiderin Staining: No No Abnormalities Noted Pineda/A Periwound Skin Color: No Abnormality No Abnormality Pineda/A Temperature: Yes Yes Pineda/A Tenderness on Palpation: Debridement Debridement Pineda/A Procedures Performed: Treatment Notes Electronic Signature(s) Signed: 07/14/2023 2:48:55 PM By: [name] Entered By: Duanne Guess on 07/14/2023 14:48:55 -------------------------------------------------------------------------------- Multi-Disciplinary Care Plan Details Patient Name: Date of Service: [name] 07/14/2023 1:45 PM Medical Record Number: [id number] Patient Account Number: [id number] Date of Birth/Sex: Treating RN: 29-Apr-1991 (32 y.o. [name] Primary Care Ajmal Kathan: [name], PRO [name] Other Clinician: Referring Barlow Harrison: Treating Bravlio Luca/Extender: [name] in Treatment: 7 [place], [place] ([id number]) [id number] Page 4 of 9 Multidisciplinary Care Plan reviewed with physician Active Inactive Venous Leg Ulcer Nursing Diagnoses: Knowledge deficit related to disease process and management Potential for venous Insuffiency (use before diagnosis confirmed) Goals: Patient will maintain optimal edema control Date  Initiated: 06/12/2023 Target Resolution Date: 08/08/2023 Goal Status: Active Interventions: Assess peripheral edema status every visit. Compression as ordered Provide education on venous insufficiency Treatment Activities: Therapeutic compression applied : 06/12/2023 Notes: Wound/Skin Impairment Nursing Diagnoses: Knowledge deficit related to smoking impact on wound healing Goals: Patient/caregiver will verbalize understanding of skin care regimen Date Initiated: 05/21/2023 Target Resolution Date: 08/08/2023 Goal Status: Active Interventions: Assess patient/caregiver ability to obtain necessary supplies Assess patient/caregiver ability to perform ulcer/skin care regimen upon admission and as needed Assess ulceration(s) every visit Provide education on ulcer and skin care Screen for HBO Treatment Activities: Skin care regimen initiated : 05/21/2023 Topical wound management initiated : 05/21/2023 Notes: Electronic Signature(s) Signed: 07/14/2023 4:24:28 PM By: [name] Entered By: Karie Schwalbe on 07/14/2023 16:19:37 -------------------------------------------------------------------------------- Pain Assessment Details Patient Name: Date of Service: [name] 07/14/2023 1:45 PM Medical Record Number: [id number] Patient Account Number: [id number] Date of Birth/Sex: Treating RN: 1991-06-05 (32 y.o. [name] Primary Care Harue Pribble: [name], PRO [name] Other Clinician: Referring Lesia Monica: Treating Rondalyn Belford/Extender: [name] in Treatment: 7 Active Problems Location of Pain Severity and Description of  Pain Patient Has Paino Yes Site Locations Pain LocationGRAE, CANNATA ([id number]) [id number] Page 5 of 9 Pain Location: Generalized Pain With Dressing Change: No Duration of the Pain. Constant / Intermittento Constant Rate the pain. Current Pain Level: 5 Worst Pain Level:  10 Least Pain Level: 3 Tolerable Pain Level: 5 Character of Pain Describe the Pain: Difficult to Pinpoint Pain Management and Medication Current Pain Management: Medication: Yes Cold Application: No Rest: Yes Massage: No Activity: No T.E.Pineda.S.: No Heat Application: No Leg drop or elevation: No Is the Current Pain Management Adequate: Adequate How does your wound impact your activities of daily livingo Sleep: No Bathing: No Appetite: No Relationship With Others: No Bladder Continence: No Emotions: No Bowel Continence: No Work: No Toileting: No Drive: No Dressing: No Hobbies: No Electronic Signature(s) Signed: 07/14/2023 4:24:28 PM By: [name] Entered By: Karie Schwalbe on 07/14/2023 14:10:47 -------------------------------------------------------------------------------- Patient/Caregiver Education Details Patient Name: Date of Service: [name] 11/5/2024andnbsp1:45 PM Medical Record Number: [id number] Patient Account Number: [id number] Date of Birth/Gender: Treating RN: 06-07-1991 (32 y.o. [name] Primary Care Physician: [name], [name] Other Clinician: Referring Physician: Treating Physician/Extender: [name] in Treatment: 7 Education Assessment Education Provided To: Patient Education Topics Provided Wound/Skin Impairment: Methods: Explain/Verbal Responses: State content correctly Electronic Signature(s) Signed: 07/14/2023 4:24:28 PM By: [name] [name], [name] ([id number]) [id number] Page 6 of 9 Entered By: Karie Schwalbe on 07/14/2023 16:19:51 -------------------------------------------------------------------------------- Wound Assessment Details Patient Name: Date of Service: [name] 07/14/2023 1:45 PM Medical Record Number: [id number] Patient Account Number: [id number] Date of Birth/Sex: Treating RN: July 30, 1991 (32 y.o. [name] Primary Care Kamdon Reisig: [name], [name] Other Clinician: Referring Toni Hoffmeister: Treating Tena Linebaugh/Extender: [name] in Treatment: 7 Wound Status Wound Number: 1 Primary Etiology: Calciphylaxis Wound Location: Left, Lateral Lower Leg Wound Status: Open Wounding Event: Bite Comorbid History: Hypertension, End Stage Renal Disease Date Acquired: 03/09/2023 Weeks Of Treatment: 7 Clustered Wound: No Photos Wound Measurements Length: (cm) 3.8 Width: (cm) 5 Depth: (cm) 0.2 Area: (cm) 14.923 Volume: (cm) 2.985 % Reduction in Area: 63.8% % Reduction in Volume: 63.8% Epithelialization: Medium (34-66%) Tunneling: No Undermining: No Wound Description Classification: Full Thickness Without Exposed Support Structures Wound Margin: Distinct, outline attached Exudate Amount: Medium Exudate Type: Serosanguineous Exudate Color: red, brown Foul Odor After Cleansing: No Slough/Fibrino Yes Wound Bed Granulation Amount: Medium (34-66%) Exposed Structure Granulation Quality: Red, Pink Fat Layer (Subcutaneous Tissue) Exposed: Yes Necrotic Amount: Medium (34-66%) Necrotic Quality: Eschar, Adherent Slough Periwound Skin Texture Texture Color No Abnormalities Noted: No No Abnormalities Noted: Yes Scarring: Yes Temperature / Pain Temperature: No Abnormality Moisture No Abnormalities Noted: No Tenderness on Palpation: Yes Dry / Scaly: No Maceration: No Treatment Notes Wound #1 (Lower Leg) Wound Laterality: Left, Lateral [place], [name] ([id number]) [id number] Page 7 of 9 Cleanser Soap and Water Discharge Instruction: May shower and wash wound with dial antibacterial soap and water prior to dressing change. Vashe 5.8 (oz) Discharge Instruction: Cleanse the wound with Vashe prior to applying a clean dressing using gauze sponges, not tissue or cotton balls. Peri-Wound Care Silver Sulfadiazine Cream 1%, 25 (g),  tube Topical Triamcinolone Discharge Instruction: Periwound - Apply Triamcinolone as directed Primary Dressing Hydrofera Blue Ready Transfer Foam, 4x5 (in/in) Discharge Instruction: Apply to wound bed as instructed Secondary Dressing Zetuvit Plus Silicone Border Dressing 5x5 (in/in) Discharge Instruction: Apply silicone border over primary dressing as directed. Secured With Compression  Wrap Compression Stockings Add-Ons Electronic Signature(s) Signed: 07/14/2023 4:24:28 PM By: [name] Entered By: Karie Schwalbe on 07/14/2023 14:27:31 -------------------------------------------------------------------------------- Wound Assessment Details Patient Name: Date of Service: [name] 07/14/2023 1:45 PM Medical Record Number: [id number] Patient Account Number: [id number] Date of Birth/Sex: Treating RN: 1991/08/19 (32 y.o. [name] Primary Care Bhavika Schnider: [name], [name] Other Clinician: Referring Yun Gutierrez: Treating Berklie Dethlefs/Extender: [name] in Treatment: 7 Wound Status Wound Number: 2 Primary Etiology: Calciphylaxis Wound Location: Left, Medial Ankle Wound Status: Open Wounding Event: Bite Comorbid History: Hypertension, End Stage Renal Disease Date Acquired: 03/09/2023 Weeks Of Treatment: 7 Clustered Wound: No Photos Wound Measurements Length: (cm) 1.5 Width: (cm) 0.8 [place], [place] ([id number]) Depth: (cm) 0.1 Area: (cm) 0.942 Volume: (cm) 0.094 % Reduction in Area: -20% % Reduction in Volume: -19% [id number] Page 8 of 9 Epithelialization: Medium (34-66%) Tunneling: No Undermining: No Wound Description Classification: Full Thickness Without Exposed Support Structures Wound Margin: Distinct, outline attached Exudate Amount: Small Exudate Type: Serosanguineous Exudate Color: red, brown Foul Odor After Cleansing: No Slough/Fibrino Yes Wound Bed Granulation Amount:  Small (1-33%) Exposed Structure Granulation Quality: Red, Pink Fat Layer (Subcutaneous Tissue) Exposed: Yes Necrotic Amount: Large (67-100%) Necrotic Quality: Eschar, Adherent Slough Periwound Skin Texture Texture Color No Abnormalities Noted: Yes No Abnormalities Noted: Yes Moisture Temperature / Pain No Abnormalities Noted: Yes Temperature: No Abnormality Tenderness on Palpation: Yes Treatment Notes Wound #2 (Ankle) Wound Laterality: Left, Medial Cleanser Soap and Water Discharge Instruction: May shower and wash wound with dial antibacterial soap and water prior to dressing change. Vashe 5.8 (oz) Discharge Instruction: Cleanse the wound with Vashe prior to applying a clean dressing using gauze sponges, not tissue or cotton balls. Peri-Wound Care Silver Sulfadiazine Cream 1%, 25 (g), tube Topical Triamcinolone Discharge Instruction: Periwound - Apply Triamcinolone as directed Primary Dressing Hydrofera Blue Ready Transfer Foam, 4x5 (in/in) Discharge Instruction: Apply to wound bed as instructed Secondary Dressing Zetuvit Plus Silicone Border Dressing 4x4 (in/in) Discharge Instruction: Apply silicone border over primary dressing as directed. Secured With Compression Wrap Compression Stockings Add-Ons Electronic Signature(s) Signed: 07/14/2023 4:24:28 PM By: [name] Entered By: Karie Schwalbe on 07/14/2023 14:22:00 -------------------------------------------------------------------------------- Vitals Details Patient Name: Date of Service: [name] 07/14/2023 1:45 PM Medical Record Number: [id number] Patient Account Number: [id number] Date of Birth/Sex: Treating RN: December 19, 1990 (32 y.o. [name] Primary Care Camika Marsico: [name], PRO [name] Other ClinicianJani Pineda ([id number]) [id number] Page 9 of 9 Referring Rodgers Likes: Treating Dinita Migliaccio/Extender: [name] in Treatment:  7 Vital Signs Time Taken: 14:09 Temperature (F): 97.9 Height (in): 71 Pulse (bpm): 75 Weight (lbs): 184 Respiratory Rate (breaths/min): 18 Body Mass Index (BMI): 25.7 Blood Pressure (mmHg): 144/87 Reference Range: 80 - 120 mg / dl Electronic Signature(s) Signed: 07/14/2023 4:24:28 PM By: [name] Entered By: Karie Schwalbe on 07/14/2023 14:28:20

## 2023-07-15 NOTE — Progress Notes (Addendum)
[place], [name] ([id number]) [id number] Page 1 of 10 Visit Report for 07/14/2023 Chief Complaint Document Details Patient Name: Date of Service: [name] 07/14/2023 1:45 PM Medical Record Number: [id number] Patient Account Number: [id number] Date of Birth/Sex: Treating RN: July 11, 1991 (32 y.o. 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 Dianna Pineda Primary Care Provider: Jannetta Quint Pineda, PRO [name] Other Clinician: Referring Provider: Treating Provider/Extender: [name] in Treatment: 7 Debridement Performed for Assessment: Wound #2 Left,Medial Ankle Performed By: Physician Duanne Guess, MD The following information was scribed by: Karie Schwalbe The information was scribed for: Duanne Guess Debridement Type: Debridement Level of Consciousness (Pre-procedure): Awake and Alert Pre-procedure Verification/Time Out Yes - 14:43 Taken: Start Time: 14:43 Pain Control: Lidocaine 4% T opical Solution Percent of Wound Bed Debrided: 100% T Area Debrided (cm): otal 0.94 Tissue and other material debrided: Viable, Non-Viable, Eschar, Slough, Subcutaneous, Slough Level: Skin/Subcutaneous Tissue Debridement Description: Excisional Instrument:  Curette Bleeding: Minimum Hemostasis Achieved: Pressure Response to Treatment: Procedure was tolerated well Level of Consciousness (Post- Awake and Alert procedure): Post Debridement Measurements of Total Wound Length: (cm) 1.5 Width: (cm) 0.8 Depth: (cm) 0.1 Volume: (cm) 0.094 Character of Wound/Ulcer Post Debridement: Improved Post Procedure Diagnosis [name] ([id number]) [id number] Page 2 of 10 Same as Pre-procedure Electronic Signature(s) Signed: 07/14/2023 3:48:24 PM By: [name] Signed: 07/14/2023 4:24:28 PM By: [name] Entered By: Karie Schwalbe on 07/14/2023 14:48:41 -------------------------------------------------------------------------------- Debridement Details Patient Name: Date of Service: [name] 07/14/2023 1:45 PM Medical Record Number: [id number] Patient Account Number: [id number] Date of Birth/Sex: Treating RN: Dec 12, 1990 (32 y.o. Dianna Pineda Primary Care Provider: Jannetta Quint Pineda, PRO [name] Other Clinician: Referring Provider: Treating Provider/Extender: [name] in Treatment: 7 Debridement Performed for Assessment: Wound #1 Left,Lateral Lower Leg Performed By: Physician Duanne Guess, MD The following information was scribed by: Karie Schwalbe The information was scribed for: Duanne Guess Debridement Type: Debridement Level of Consciousness (Pre-procedure): Awake and Alert Pre-procedure Verification/Time Out Yes - 14:43 Taken: Start Time: 14:43 Pain Control: Lidocaine 4% T opical Solution Percent of Wound Bed Debrided: 100% T Area Debrided (cm): otal 14.91 Tissue and other material debrided: Viable, Non-Viable, Slough, Subcutaneous, Slough Level: Skin/Subcutaneous Tissue Debridement Description: Excisional Instrument: Curette Bleeding: Minimum Hemostasis Achieved: Pressure Response to Treatment: Procedure was tolerated well Level  of Consciousness (Post- Awake and Alert procedure): Post Debridement Measurements of Total Wound Length: (cm) 3.8 Width: (cm) 5 Depth: (cm) 0.2 Volume: (cm) 2.985 Character of Wound/Ulcer Post Debridement: Improved Post Procedure Diagnosis Same as Pre-procedure Electronic Signature(s) Signed: 07/14/2023 3:48:24 PM By: [name] Signed: 07/14/2023 4:24:28 PM By: [name] Entered By: Karie Schwalbe on 07/14/2023 14:48:51 -------------------------------------------------------------------------------- HPI Details Patient Name: Date of Service: [name] 07/14/2023 1:45 PM Medical Record Number: [id number] Patient Account Number: [id number] Date of Birth/Sex: Treating RN: 1990-10-28 (32 y.o. M) Primary Care Provider: DEFA Pineda, [name] Other Clinician: [place],  [name] ([id number]) [id number] Page 3 of 10 Referring Provider: Treating Provider/Extender: [name] in Treatment: 7 History of Present Illness HPI Description: ADMISSION 05/21/2023 This is a 32 year old man with end-stage renal disease on hemodialysis. In July of this year, he was seen by podiatry for "cellulitis with an open wound on his left foot and ankle.". He apparently was given clindamycin and mupirocin by urgent care prior to his visit with Dr. Annamary Rummage. At that time, the wound was felt to be improving on the prescribed regimen. About 3 weeks later, the wound was worsening. 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 The patient has presented to the emergency department in [facility] several times due to increased pain. He was apparently referred to plastic surgery from the ED. He saw Dr. 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 According to the dialysis rounding note from Tuesday of this week, sodium thiosulfate is being empirically initiated. I still do not see that a biopsy has yet been performed. He is not currently taking Sensipar, as far as I can see. His intact PTH is actually not particularly high with his last value being 173 and his calcium phosphorus product only 55, although his phosphorus has only recently started to come under control with previous values in the 7-8 range. 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 05/27/2023: The wounds actually look better this week. There has been some epithelialization. The biopsy site has a fair amount of slough accumulation. The wounds are still quite painful. He reports that they have definitely started sodium thiosulfate at his dialysis sessions and he received a call from [facility] regarding the referral for parathyroidectomy, but he was unable to answer and will be calling them back. No results from the biopsy so far. 06/05/2023: The wounds continue to improve. There is more epithelialization. There is slough buildup on the wound surfaces, particularly at the biopsy site. 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 The pattern correlated with stasis dermatitis or trauma. 06/12/2023: The lateral wound is looking quite a bit better this week. Multiple small pockmarks have healed. There is a fair amount of slough on the wound surface and the biopsy site still has quite a bit of depth to it. The medial ulcers are exquisitely painful and they appear to have opened up slightly, but they are clean without any evidence of infection. He did meet with endocrine surgery at [facility], but given the negative biopsy for calciphylaxis, no surgical intervention was advised. He has not  heard anything from vascular surgery regarding the scheduling of his venous reflux studies. 06/17/2023: Both wounds are filling in with good epithelialized tissue. They still have slough on the wound surfaces. They continue to be exquisitely painful. His venous reflux studies are scheduled for this Friday. 06/24/2023: The wounds continue to epithelialize. He does accumulate a bit of slough on the open surfaces but the medial ankle wound is nearly closed. They are far less painful than they have been, as well. His venous reflux studies were performed and were negative. 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 07/14/2023: The  medial ankle wound has a layer of eschar over it, underneath which there is a tiny residual opening. The lateral aspect has epithelialized significantly, but he is complaining of pruritus. There does appear to be some irritation around the wound edges. Electronic Signature(s) Signed: 07/14/2023 2:52:48 PM By: [name] Entered By: Duanne Guess on 07/14/2023 14:52:48 -------------------------------------------------------------------------------- Physical Exam Details Patient Name: Date of Service: CA RTA Marilynne Halsted Pineda 07/14/2023 1:45 PM Medical Record Number: [id number] Patient Account Number: [id number] Date of Birth/Sex: Treating RN: 02/04/1991 (32 y.o. M) Primary Care Provider: Jannetta Quint Pineda, [name] Other Clinician: Referring Provider: Treating Provider/Extender: [name] in Treatment: 7 Constitutional Slightly hypertensive. . . . no acute distress. Respiratory Normal work of breathing on room air.. Notes 07/14/2023: The medial ankle wound has a layer of eschar over it, underneath which there is a tiny residual opening. The lateral aspect has epithelialized significantly, but he is complaining of pruritus. There does appear to be some irritation  around the wound edges. Electronic Signature(s) Signed: 07/14/2023 2:54:40 PM By: [name] Entered By: Duanne Guess on 07/14/2023 14:54:40 [name] ([id number]) [id number] Page 4 of 10 -------------------------------------------------------------------------------- Physician Orders Details Patient Name: Date of Service: [name] 07/14/2023 1:45 PM Medical Record Number: [id number] Patient Account Number: [id number] Date of Birth/Sex: Treating RN: 19-Jun-1991 (32 y.o. Dianna Pineda Primary Care Provider: Jannetta Quint Pineda, PRO [name] Other Clinician: Referring Provider: Treating Provider/Extender: [name] in Treatment: 7 Verbal / Phone Orders: No Diagnosis Coding Follow-up Appointments ppointment in 1 week. - Dr Lady Gary RM 2 - Return A Please schedule at the front desk Other: - +++Pick up Hydrocortisone Cream or ointment at the pharmacy to help with itching around the wounds.Costs around $8-$13 Anesthetic Wound #1 Left,Lateral Lower Leg (In clinic) Topical Lidocaine 5% applied to wound bed (In clinic) Topical Lidocaine 4% applied to wound bed Wound #2 Left,Medial Ankle (In clinic) Topical Lidocaine 5% applied to wound bed (In clinic) Topical Lidocaine 4% applied to wound bed Bathing/ Shower/ Hygiene May shower and wash wound with soap and water. Off-Loading Crutches for Offloading - Please use two crutches for offloading. Additional Orders / Instructions Other: - May purchase hydrocortisone cream to use on periwound area at home. Wound Treatment Wound #1 - Lower Leg Wound Laterality: Left, Lateral Cleanser: Soap and Water 1 x Per Day/30 Days Discharge Instructions: May shower and wash wound with dial antibacterial soap and water prior to dressing change. Cleanser: Vashe 5.8 (oz) 1 x Per Day/30 Days Discharge Instructions: Cleanse the wound with Vashe prior to applying a clean  dressing using gauze sponges, not tissue or cotton balls. Peri-Wound Care: Silver Sulfadiazine Cream 1%, 25 (g), tube (Generic) 1 x Per Day/30 Days Topical: Triamcinolone 1 x Per Day/30 Days Discharge Instructions: Periwound - Apply Triamcinolone as directed Prim Dressing: Hydrofera Blue Ready Transfer Foam, 4x5 (in/in) (DME) (Generic) 1 x Per Day/30 Days ary Discharge Instructions: Apply to wound bed as instructed Secondary Dressing: Zetuvit Plus Silicone Border Dressing 5x5 (in/in) (DME) (Generic) 1 x Per Day/30 Days Discharge Instructions: Apply silicone border over primary dressing as directed. Wound #2 - Ankle Wound Laterality: Left, Medial Cleanser: Soap and Water 1 x Per Day/30 Days Discharge Instructions: May shower and wash wound with dial antibacterial soap and water prior to dressing change. Cleanser: Vashe 5.8 (oz) (Generic) 1 x Per Day/30 Days Discharge Instructions: Cleanse the wound with Vashe prior to applying a  clean dressing using gauze sponges, not tissue or cotton balls. Peri-Wound Care: Silver Sulfadiazine Cream 1%, 25 (g), tube (Generic) 1 x Per Day/30 Days Topical: Triamcinolone 1 x Per Day/30 Days Discharge Instructions: Periwound - Apply Triamcinolone as directed Prim Dressing: Hydrofera Blue Ready Transfer Foam, 4x5 (in/in) (DME) (Generic) 1 x Per Day/30 Days ary Discharge Instructions: Apply to wound bed as instructed Secondary Dressing: Zetuvit Plus Silicone Border Dressing 4x4 (in/in) (DME) (Generic) 1 x Per Day/30 Days Discharge Instructions: Apply silicone border over primary dressing as directed. [place], [name] ([id number]) [id number] Page 5 of 10 Electronic Signature(s) Signed: 07/14/2023 4:24:28 PM By: [name] Signed: 07/15/2023 7:55:02 AM By: [name] Previous Signature: 07/14/2023 3:48:24 PM Version By: [name] Entered By: Karie Schwalbe on 07/14/2023 16:13:30 Prescription  07/14/2023 -------------------------------------------------------------------------------- Alona Bene MD Patient Name: Provider: May 06, 1991 [id number] Date of Birth: NPI#: [id number] Sex: DEA #: [phone] Phone #: License #: UPN: Patient Address: [address] Eligha Bridegroom [facility] [place]  [address] , [address] [place], [place] [address] [phone] Allergies No Known Allergies Provider's Orders Crutches for Offloading - Please use two crutches for offloading. Hand Signature: Date(s): Electronic Signature(s) Signed: 07/14/2023 4:24:28 PM By: [name] Signed: 07/15/2023 7:55:02 AM By: [name] Previous Signature: 07/14/2023 3:48:24 PM Version By: [name] Entered By: Karie Schwalbe on 07/14/2023 16:13:30 -------------------------------------------------------------------------------- Problem List Details Patient Name: Date of Service: [name] 07/14/2023 1:45 PM Medical Record Number: [id number] Patient Account Number: [id number] Date of Birth/Sex: Treating RN: Jun 23, 1991 (32 y.o. M) Primary Care Provider: Jannetta Quint Pineda, [name] Other Clinician: Referring Provider: Treating Provider/Extender: [name] in Treatment: 7 Active Problems ICD-10 Encounter Code Description Active Date MDM Diagnosis L97.322 Non-pressure chronic ulcer of left ankle with fat layer exposed 05/21/2023 No Yes I10 Essential (primary) hypertension 05/21/2023 No Yes [place], [name] ([id number]) [id number] Page 6 of 10 N18.6 End stage renal disease 05/21/2023 No Yes Z99.2 Dependence on renal dialysis 05/21/2023 No Yes Inactive Problems Resolved Problems Electronic Signature(s) Signed: 07/14/2023 2:48:33 PM By: [name] Entered By: Duanne Guess on 07/14/2023  14:48:33 -------------------------------------------------------------------------------- Progress Note Details Patient Name: Date of Service: [name] 07/14/2023 1:45 PM Medical Record Number: [id number] Patient Account Number: [id number] Date of Birth/Sex: Treating RN: Jan 31, 1991 (32 y.o. 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 In July of this year, he was seen by podiatry for "cellulitis with an open wound on his left foot and ankle.". He apparently was given clindamycin and mupirocin by urgent care prior to his visit with Dr. Annamary Rummage. At that time, the wound was felt to be improving on the prescribed regimen. About 3 weeks later, the wound was worsening. 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 The patient has presented to the emergency department in [facility] several times due to increased pain. He was apparently referred to plastic surgery from the ED. He saw Dr. 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 According to the dialysis rounding note from Tuesday of this week, sodium thiosulfate is being empirically initiated. I still do not see that a biopsy has yet been performed. He is not currently taking Sensipar, as far as I can see. His intact PTH is actually not particularly high with his last value being 173 and his calcium phosphorus  product only 55, although his phosphorus has only recently started to come under control with previous values in the 7-8 range. 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 05/27/2023: The wounds actually look better this week. There has been some epithelialization. The biopsy site has a fair amount of slough accumulation. The wounds are still quite painful. He reports that they have definitely started sodium thiosulfate at his dialysis sessions and he received a call from [facility] regarding the referral for parathyroidectomy, but he was unable to answer and will be calling them back. No results from the biopsy so far. 06/05/2023: The wounds continue to improve. There is more epithelialization. There is slough buildup on the wound surfaces, particularly at the biopsy site. 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 The pattern correlated with stasis dermatitis or trauma. 06/12/2023: The lateral wound is looking quite a bit better this week. Multiple small pockmarks have healed. There is a fair amount of slough on the wound surface and the biopsy site still has quite a bit of depth to it. The medial ulcers are exquisitely painful and they appear to have opened up slightly, but they are clean without any evidence of infection. He did meet with endocrine surgery at [facility], but given the negative biopsy for calciphylaxis, no surgical intervention was advised. He has not heard anything from vascular surgery regarding the scheduling of his venous reflux studies. 06/17/2023: Both wounds are filling in with good epithelialized tissue. They still have slough on the wound surfaces. They continue to be exquisitely painful. His venous reflux studies are scheduled for this Friday. 06/24/2023: The wounds continue to epithelialize. He does accumulate a bit of slough on  the open surfaces but the medial ankle wound is nearly closed. They are far less painful than they have been, as well. His venous reflux studies were performed and were negative. 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 07/14/2023: The medial ankle wound has a layer of eschar over it, underneath which there is a tiny residual opening. The lateral aspect has epithelialized significantly, but he is complaining of pruritus. There does appear to be some irritation around the wound edges. [place], [name] ([id number]) [id number] Page 7 of 10 Patient History Information obtained from Chart. Family History Diabetes - Mother, Heart Disease - Mother, Hypertension - Mother, Kidney Disease - Siblings. Social History Former smoker - ended on 03/09/2019, Alcohol Use - Never, Drug Use - No History, Caffeine Use - Never. Medical History Cardiovascular Patient has history of Hypertension Genitourinary Patient has history of End Stage Renal Disease Hospitalization/Surgery History - av fistula placement. - EGD. Medical A Surgical History Notes nd Genitourinary GI bleed, ulcer Psychiatric anxiety, depression Objective Constitutional Slightly hypertensive. no acute distress. Vitals Time Taken: 2:09 PM, Height: 71 in, Weight: 184 lbs, BMI: 25.7, Temperature: 97.9 F, Pulse: 75 bpm, Respiratory Rate: 18 breaths/min,  Blood Pressure: 144/87 mmHg. Respiratory Normal work of breathing on room air.. General Notes: 07/14/2023: The medial ankle wound has a layer of eschar over it, underneath which there is a tiny residual opening. The lateral aspect has epithelialized significantly, but he is complaining of pruritus. There does appear to be some irritation around the wound edges. Integumentary (Hair, Skin) Wound #1 status is Open. Original cause of wound was Bite. The date acquired was: 03/09/2023. The wound has been in  treatment 7 weeks. The wound is located on the Left,Lateral Lower Leg. The wound measures 3.8cm length x 5cm width x 0.2cm depth; 14.923cm^2 area and 2.985cm^3 volume. There is Fat Layer (Subcutaneous Tissue) exposed. There is no tunneling or undermining noted. There is a medium amount of serosanguineous drainage noted. The wound margin is distinct with the outline attached to the wound base. There is medium (34-66%) red, pink granulation within the wound bed. There is a medium (34-66%) amount of necrotic tissue within the wound bed including Eschar and Adherent Slough. The periwound skin appearance had no abnormalities noted for color. The periwound skin appearance exhibited: Scarring. The periwound skin appearance did not exhibit: Dry/Scaly, Maceration. Periwound temperature was noted as No Abnormality. The periwound has tenderness on palpation. Wound #2 status is Open. Original cause of wound was Bite. The date acquired was: 03/09/2023. The wound has been in treatment 7 weeks. The wound is located on the Left,Medial Ankle. The wound measures 1.5cm length x 0.8cm width x 0.1cm depth; 0.942cm^2 area and 0.094cm^3 volume. There is Fat Layer (Subcutaneous Tissue) exposed. There is no tunneling or undermining noted. There is a small amount of serosanguineous drainage noted. The wound margin is distinct with the outline attached to the wound base. There is small (1-33%) red, pink granulation within the wound bed. There is a large (67-100%) amount of necrotic tissue within the wound bed including Eschar and Adherent Slough. The periwound skin appearance had no abnormalities noted for texture. The periwound skin appearance had no abnormalities noted for moisture. The periwound skin appearance had no abnormalities noted for color. Periwound temperature was noted as No Abnormality. The periwound has tenderness on palpation. Assessment Active Problems ICD-10 Non-pressure chronic ulcer of left ankle with fat  layer exposed Essential (primary) hypertension End stage renal disease Dependence on renal dialysis Procedures Wound #1 Pre-procedure diagnosis of Wound #1 is a Calciphylaxis located on the Left,Lateral Lower Leg . There was a Excisional Skin/Subcutaneous Tissue Debridement with a total area of 14.91 sq cm performed by Duanne Guess, MD. With the following instrument(s): Curette to remove Viable and Non-Viable [place], [name] ([id number]) [phone] Page 8 of 10 tissue/material. Material removed includes Subcutaneous Tissue and Slough and after achieving pain control using Lidocaine 4% T opical Solution. No specimens were taken. A time out was conducted at 14:43, prior to the start of the procedure. A Minimum amount of bleeding was controlled with Pressure. The procedure was tolerated well. Post Debridement Measurements: 3.8cm length x 5cm width x 0.2cm depth; 2.985cm^3 volume. Character of Wound/Ulcer Post Debridement is improved. Post procedure Diagnosis Wound #1: Same as Pre-Procedure Wound #2 Pre-procedure diagnosis of Wound #2 is a Calciphylaxis located on the Left,Medial Ankle . There was a Excisional Skin/Subcutaneous Tissue Debridement with a total area of 0.94 sq cm performed by Duanne Guess, MD. With the following instrument(s): Curette to remove Viable and Non-Viable tissue/material. Material removed includes Eschar, Subcutaneous Tissue, and Slough after achieving pain control using Lidocaine 4% T opical Solution. No specimens were taken. A  time out was conducted at 14:43, prior to the start of the procedure. A Minimum amount of bleeding was controlled with Pressure. The procedure was tolerated well. Post Debridement Measurements: 1.5cm length x 0.8cm width x 0.1cm depth; 0.094cm^3 volume. Character of Wound/Ulcer Post Debridement is improved. Post procedure Diagnosis Wound #2: Same as Pre-Procedure Plan Follow-up Appointments: Return Appointment  in 1 week. - Dr Lady Gary RM 2 - Please schedule at the front desk Other: - +++Pick up Hydrocortisone Cream or ointment at the pharmacy to help with itching around the wounds.Costs around $8-$13 Anesthetic: Wound #1 Left,Lateral Lower Leg: (In clinic) Topical Lidocaine 5% applied to wound bed (In clinic) Topical Lidocaine 4% applied to wound bed Wound #2 Left,Medial Ankle: (In clinic) Topical Lidocaine 5% applied to wound bed (In clinic) Topical Lidocaine 4% applied to wound bed Bathing/ Shower/ Hygiene: May shower and wash wound with soap and water. Off-Loading: Crutches for Offloading - Please use two crutches for offloading. Additional Orders / Instructions: Other: - May purchase hydrocortisone cream to use on periwound area at home. WOUND #1: - Lower Leg Wound Laterality: Left, Lateral Cleanser: Soap and Water 1 x Per Day/30 Days Discharge Instructions: May shower and wash wound with dial antibacterial soap and water prior to dressing change. Cleanser: Vashe 5.8 (oz) 1 x Per Day/30 Days Discharge Instructions: Cleanse the wound with Vashe prior to applying a clean dressing using gauze sponges, not tissue or cotton balls. Peri-Wound Care: Silver Sulfadiazine Cream 1%, 25 (g), tube (Generic) 1 x Per Day/30 Days Topical: Triamcinolone 1 x Per Day/30 Days Discharge Instructions: Periwound - Apply Triamcinolone as directed Prim Dressing: Hydrofera Blue Ready Transfer Foam, 4x5 (in/in) (DME) (Generic) 1 x Per Day/30 Days ary Discharge Instructions: Apply to wound bed as instructed Secondary Dressing: Zetuvit Plus Silicone Border Dressing 5x5 (in/in) (DME) (Generic) 1 x Per Day/30 Days Discharge Instructions: Apply silicone border over primary dressing as directed. WOUND #2: - Ankle Wound Laterality: Left, Medial Cleanser: Soap and Water 1 x Per Day/30 Days Discharge Instructions: May shower and wash wound with dial antibacterial soap and water prior to dressing change. Cleanser: Vashe 5.8  (oz) (Generic) 1 x Per Day/30 Days Discharge Instructions: Cleanse the wound with Vashe prior to applying a clean dressing using gauze sponges, not tissue or cotton balls. Peri-Wound Care: Silver Sulfadiazine Cream 1%, 25 (g), tube (Generic) 1 x Per Day/30 Days Topical: Triamcinolone 1 x Per Day/30 Days Discharge Instructions: Periwound - Apply Triamcinolone as directed Prim Dressing: Hydrofera Blue Ready Transfer Foam, 4x5 (in/in) (DME) (Generic) 1 x Per Day/30 Days ary Discharge Instructions: Apply to wound bed as instructed Secondary Dressing: Zetuvit Plus Silicone Border Dressing 4x4 (in/in) (DME) (Generic) 1 x Per Day/30 Days Discharge Instructions: Apply silicone border over primary dressing as directed. 07/14/2023: The medial ankle wound has a layer of eschar over it, underneath which there is a tiny residual opening. The lateral aspect has epithelialized significantly, but he is complaining of pruritus. There does appear to be some irritation around the wound edges. I used a curette to debride slough and subcutaneous tissue from the lateral ankle wounds and eschar, slough, and subcutaneous tissue from the medial ankle wound. We will continue Silvadene with Hydrofera Blue and Ace bandage compression. I suggested that he try applying some over-the-counter hydrocortisone cream to the skin around his wound where he has been experiencing the itching. He will follow-up in 1 to 2 weeks, pending clinic availability. Electronic Signature(s) Signed: 07/16/2023 6:10:25 PM By: Shawn Stall RN, BSN  Signed: 07/17/2023 7:56:51 AM By: [name] Previous Signature: 07/14/2023 2:56:18 PM Version By: [name] Entered By: Shawn Stall on 07/16/2023 15:54:52 [name] ([id number]) [id number] Page 9 of 10 -------------------------------------------------------------------------------- HxROS Details Patient Name: Date of Service: [name] 07/14/2023 1:45 PM Medical Record Number: [id number] Patient Account Number: [id number] Date of Birth/Sex: Treating RN: 04-14-1991 (32 y.o. M) Primary Care Provider: Jannetta Quint Pineda, [name] Other Clinician: Referring Provider: Treating Provider/Extender: [name] in Treatment: 7 Information Obtained From Chart Cardiovascular Medical History: Positive for: Hypertension Genitourinary Medical History: Positive for: End Stage Renal Disease Past Medical History Notes: GI bleed, ulcer Psychiatric Medical History: Past Medical History Notes: anxiety, depression Immunizations Pneumococcal Vaccine: Received Pneumococcal Vaccination: No Implantable Devices No devices added Hospitalization / Surgery History Type of Hospitalization/Surgery av fistula placement EGD Family and Social History Diabetes: Yes - Mother; Heart Disease: Yes - Mother; Hypertension: Yes - Mother; Kidney Disease: Yes - Siblings; Former smoker - ended on 03/09/2019; Alcohol Use: Never; Drug Use: No History; Caffeine Use: Never; Financial Concerns: No; Food, Clothing or Shelter Needs: No; Support System Lacking: No; Transportation Concerns: No Electronic Signature(s) Signed: 07/14/2023 3:48:24 PM By: [name] Entered By: Duanne Guess on 07/14/2023 14:52:55 -------------------------------------------------------------------------------- SuperBill Details Patient Name: Date of Service: [place] RTA Marilynne Halsted Pineda 07/14/2023 Medical Record Number: [id number] Patient Account Number: [id number] Date of Birth/Sex: Treating RN: Jun 09, 1991 (32 y.o. M) Primary Care Provider: Jannetta Quint Pineda, [name] Other Clinician: Referring Provider: Treating Provider/Extender: [name] in Treatment: 7 [place], [place] ([id number]) [id number] Page 10 of 10 Diagnosis Coding ICD-10 Codes Code Description L97.322 Non-pressure  chronic ulcer of left ankle with fat layer exposed I10 Essential (primary) hypertension N18.6 End stage renal disease Z99.2 Dependence on renal dialysis Facility Procedures : CPT4 Code: [id number] Description: 11042 - DEB SUBQ TISSUE 20 SQ CM/< ICD-10 Diagnosis Description L97.322 Non-pressure chronic ulcer of left ankle with fat layer exposed Modifier: Quantity: 1 Physician Procedures : CPT4 Code Description Modifier [id number] - WC PHYS LEVEL 3 - EST PT ICD-10 Diagnosis Description L97.322 Non-pressure chronic ulcer of left ankle with fat layer exposed I10 Essential (primary) hypertension N18.6 End stage renal disease Z99.2  Dependence on renal dialysis Quantity: 1 : [id number] 11042 - WC PHYS SUBQ TISS 20 SQ CM ICD-10 Diagnosis Description L97.322 Non-pressure chronic ulcer of left ankle with fat layer exposed Quantity: 1 Electronic Signature(s) Signed: 07/14/2023 2:56:36 PM By: [name] Entered By: Duanne Guess on 07/14/2023 14:56:36

## 2023-07-30 ENCOUNTER — Encounter (HOSPITAL_BASED_OUTPATIENT_CLINIC_OR_DEPARTMENT_OTHER): Payer: Medicare Other | Admitting: General Surgery

## 2023-07-30 DIAGNOSIS — L97322 Non-pressure chronic ulcer of left ankle with fat layer exposed: Secondary | ICD-10-CM | POA: Diagnosis not present

## 2023-07-30 NOTE — Progress Notes (Signed)
[name], [name] ([id number]) [id number] Page 1 of 9 Visit Report for 07/30/2023 Arrival Information Details Patient Name: Date of Service: [name] 07/30/2023 3:30 PM Medical Record Number: [id number] Patient Account Number: [id number] Date of Birth/Sex: Treating RN: October 10, 1990 (32 y.o. M) Primary Care [name]: [name] ULT, [name] Other Clinician: Referring [name]: Treating [name]/Extender: [name] in Treatment: 10 Visit Information History Since Last Visit Added or deleted any medications: No Patient Arrived: Ambulatory Any new allergies or adverse reactions: No Arrival Time: 15:43 Had a fall or experienced change in No Accompanied By: self activities of daily living that may affect Transfer Assistance: None risk of falls: Patient Identification Verified: Yes Signs or symptoms of abuse/neglect since last visito No Secondary Verification Process Completed: Yes Hospitalized since last visit: No Patient Requires Transmission-Based Precautions: No Implantable device outside of the clinic excluding No Patient Has Alerts: No cellular tissue based products placed in the center since last visit: Pain Present Now: No Electronic Signature(s) Signed: 07/30/2023 4:33:59 PM By: [name] Entered By: [name] on 07/30/2023 15:43:57 -------------------------------------------------------------------------------- Encounter Discharge Information Details Patient Name: Date of Service: [name] 07/30/2023 3:30 PM Medical Record Number: [id number] Patient Account Number: [id number] Date of Birth/Sex: Treating RN: October 22, 1990 (32 y.o. [name] Primary Care [name]: [name] ULT, PRO [name] Other Clinician: Referring [name]: Treating [name]/Extender: [name] in Treatment: 10 Encounter Discharge Information Items Post Procedure Vitals Discharge  Condition: Stable Temperature (F): 98.4 Ambulatory Status: Ambulatory Pulse (bpm): 75 Discharge Destination: Home Respiratory Rate (breaths/min): 18 Transportation: Private Auto Blood Pressure (mmHg): 135/83 Accompanied By: self Schedule Follow-up Appointment: Yes Clinical Summary of Care: Electronic Signature(s) Signed: 07/30/2023 4:22:55 PM By: Travis Stall RN, BSN Entered By: [name] on 07/30/2023 16:05:36 [name] [id number] Page 2 of 9 -------------------------------------------------------------------------------- Lower Extremity Assessment Details Patient Name: Date of Service: [name] 07/30/2023 3:30 PM Medical Record Number: [id number] Patient Account Number: [id number] Date of Birth/Sex: Treating RN: 1990/12/02 (32 y.o. [name] Primary Care Mane Consolo: [name] ULT, [name] Other Clinician: Referring [name]: Treating [name]/Extender: [name] in Treatment: 10 Edema Assessment Assessed: [Left: Yes] [Right: No] Edema: [Left: Ye] [Right: s] Calf Left: Right: Point of Measurement: From Medial Instep 38.5 cm Ankle Left: Right: Point of Measurement: From Medial Instep 24 cm Vascular Assessment Pulses: Dorsalis Pedis Palpable: [Left:Yes] Extremity colors, hair growth, and conditions: Extremity Color: [Left:Hyperpigmented] Hair Growth on Extremity: [Left:Yes] Temperature of Extremity: [Left:Warm] Capillary Refill: [Left:< 3 seconds] Dependent Rubor: [Left:No] Blanched when Elevated: [Left:No No] Toe Nail Assessment Left: Right: Thick: No Discolored: No Deformed: No Improper Length and Hygiene: No Electronic Signature(s) Signed: 07/30/2023 4:22:55 PM By: Travis Stall RN, BSN Entered By: [name] on 07/30/2023 16:00:39 -------------------------------------------------------------------------------- Multi Wound Chart Details Patient Name: Date  of Service: [name] 07/30/2023 3:30 PM Medical Record Number: [id number] Patient Account Number: [id number] Date of Birth/Sex: Treating RN: [birth year] (32 y.o. M) Primary Care Briele Lagasse: [name] ULT, [name] Other Clinician: Referring [name]: Treating [name]/Extender: [name] in Treatment: 10 Vital Signs Height(in): 71 Pulse(bpm): 75 Weight(lbs): 184 Blood Pressure(mmHg): 135/83 Pineda, Travis Hatcher ([id number]) [phone].pdf Page 3 of 9 Body Mass Index(BMI): 25.7 Temperature(F): 98.4 Respiratory Rate(breaths/min): 18 [1:Photos:] [Pineda/A:Pineda/A] Left, Lateral Lower Leg Left, Medial Ankle Pineda/A Wound Location: Bite Bite Pineda/A Wounding Event: Calciphylaxis Calciphylaxis Pineda/A Primary Etiology: Hypertension, End Stage Renal Hypertension, End  Stage Renal Pineda/A Comorbid History: Disease Disease 03/09/2023 03/09/2023 Pineda/A Date Acquired: 10 10 Pineda/A Weeks of Treatment: Open Healed - Epithelialized Pineda/A Wound Status: No No Pineda/A Wound Recurrence: 3x1.4x0.1 0x0x0 Pineda/A Measurements L x W x D (cm) 3.299 0 Pineda/A A (cm) : rea 0.33 0 Pineda/A Volume (cm) : 92.00% 100.00% Pineda/A % Reduction in A rea: 96.00% 100.00% Pineda/A % Reduction in Volume: Full Thickness Without Exposed Full Thickness Without Exposed Pineda/A Classification: Support Structures Support Structures Medium Small Pineda/A Exudate A mount: Serosanguineous Serosanguineous Pineda/A Exudate Type: red, brown red, brown Pineda/A Exudate Color: Distinct, outline attached Distinct, outline attached Pineda/A Wound Margin: Medium (34-66%) Small (1-33%) Pineda/A Granulation A mount: Red, Pink Red, Pink Pineda/A Granulation Quality: Medium (34-66%) Large (67-100%) Pineda/A Necrotic A mount: Eschar, Adherent Slough Eschar Pineda/A Necrotic Tissue: Fat Layer (Subcutaneous Tissue): Yes Fat Layer (Subcutaneous Tissue): Yes Pineda/A Exposed Structures: Medium (34-66%) Medium (34-66%) Pineda/A Epithelialization: Debridement -  Excisional Pineda/A Pineda/A Debridement: Pre-procedure Verification/Time Out 16:00 Pineda/A Pineda/A Taken: Lidocaine 4% Topical Solution Pineda/A Pineda/A Pain Control: Subcutaneous, Slough Pineda/A Pineda/A Tissue Debrided: Skin/Subcutaneous Tissue Pineda/A Pineda/A Level: 1.65 Pineda/A Pineda/A Debridement A (sq cm): rea Curette Pineda/A Pineda/A Instrument: Minimum Pineda/A Pineda/A Bleeding: Pressure Pineda/A Pineda/A Hemostasis A chieved: 0 Pineda/A Pineda/A Procedural Pain: 0 Pineda/A Pineda/A Post Procedural Pain: Procedure was tolerated well Pineda/A Pineda/A Debridement Treatment Response: 3x1.4x0.1 Pineda/A Pineda/A Post Debridement Measurements L x W x D (cm) 0.33 Pineda/A Pineda/A Post Debridement Volume: (cm) Scarring: Yes No Abnormalities Noted Pineda/A Periwound Skin Texture: Maceration: No No Abnormalities Noted Pineda/A Periwound Skin Moisture: Dry/Scaly: No Hemosiderin Staining: No No Abnormalities Noted Pineda/A Periwound Skin Color: No Abnormality No Abnormality Pineda/A Temperature: Yes Yes Pineda/A Tenderness on Palpation: Debridement Pineda/A Pineda/A Procedures Performed: Treatment Notes Wound #1 (Lower Leg) Wound Laterality: Left, Lateral Cleanser Soap and Water Discharge Instruction: May shower and wash wound with dial antibacterial soap and water prior to dressing change. Vashe 5.8 (oz) Discharge Instruction: Cleanse the wound with Vashe prior to applying a clean dressing using gauze sponges, not tissue or cotton balls. Peri-Wound Care Triamcinolone 15 (g) Discharge Instruction: Use triamcinolone mix and zinc oxide Zinc Oxide Ointment 30g tube [name] ([id number]) [phone].pdf Page 4 of 9 Discharge Instruction: Apply Zinc Oxide and TCA cream to periwound with each dressing change Silver Sulfadiazine Cream 1%, 25 (g), tube Discharge Instruction: to wound. Topical Primary Dressing Hydrofera Blue Ready Transfer Foam, 4x5 (in/in) Discharge Instruction: Apply to wound bed as instructed Secondary Dressing Zetuvit Plus Silicone Border Dressing 5x5 (in/in) Discharge  Instruction: Apply silicone border over primary dressing as directed. Secured With Compression Wrap Compression Stockings Add-Ons Wound #2 (Ankle) Wound Laterality: Left, Medial Cleanser Peri-Wound Care Topical Primary Dressing Secondary Dressing Secured With Compression Wrap Compression Stockings Add-Ons Electronic Signature(s) Signed: 07/30/2023 4:27:58 PM By: [name] Entered By: Duanne Guess on 07/30/2023 16:27:58 -------------------------------------------------------------------------------- Multi-Disciplinary Care Plan Details Patient Name: Date of Service: [name] 07/30/2023 3:30 PM Medical Record Number: [id number] Patient Account Number: [id number] Date of Birth/Sex: Treating RN: 25-Jun-1991 (32 y.o. [name] Primary Care Dmonte Maher: [name] ULT, PRO [name] Other Clinician: Referring Nallely Yost: Treating Etheridge Geil/Extender: [name] in Treatment: 10 Multidisciplinary Care Plan reviewed with physician Active Inactive Venous Leg Ulcer Nursing Diagnoses: Knowledge deficit related to disease process and management Potential for venous Insuffiency (use before diagnosis confirmed) Goals: Patient will maintain optimal edema control Date Initiated: 06/12/2023 Target Resolution Date: 08/08/2023 SHAMUS, BOGGS ([id number]) [id number] Page 5 of  9 Goal Status: Active Interventions: Assess peripheral edema status every visit. Compression as ordered Provide education on venous insufficiency Treatment Activities: Therapeutic compression applied : 06/12/2023 Notes: Wound/Skin Impairment Nursing Diagnoses: Knowledge deficit related to smoking impact on wound healing Goals: Patient/caregiver will verbalize understanding of skin care regimen Date Initiated: 05/21/2023 Target Resolution Date: 08/08/2023 Goal Status: Active Interventions: Assess patient/caregiver ability to obtain  necessary supplies Assess patient/caregiver ability to perform ulcer/skin care regimen upon admission and as needed Assess ulceration(s) every visit Provide education on ulcer and skin care Screen for HBO Treatment Activities: Skin care regimen initiated : 05/21/2023 Topical wound management initiated : 05/21/2023 Notes: Electronic Signature(s) Signed: 07/30/2023 4:22:55 PM By: Travis Stall RN, BSN Entered By: [name] on 07/30/2023 16:04:14 -------------------------------------------------------------------------------- Pain Assessment Details Patient Name: Date of Service: [name] 07/30/2023 3:30 PM Medical Record Number: [id number] Patient Account Number: [id number] Date of Birth/Sex: Treating RN: 07-08-1991 (32 y.o. M) Primary Care Keagan Brislin: [name] ULT, [name] Other Clinician: Referring Jamale Spangler: Treating Truly Stankiewicz/Extender: [name] in Treatment: 10 Active Problems Location of Pain Severity and Description of Pain Patient Has Paino No Site Locations Vander, [place] ([id number]) [id number] Page 6 of 9 Pain Management and Medication Current Pain Management: Electronic Signature(s) Signed: 07/30/2023 4:33:59 PM By: [name] Entered By: [name] on 07/30/2023 15:44:27 -------------------------------------------------------------------------------- Patient/Caregiver Education Details Patient Name: Date of Service: [name] 11/21/2024andnbsp3:30 PM Medical Record Number: [id number] Patient Account Number: [id number] Date of Birth/Gender: Treating RN: 1990-12-23 (32 y.o. [name] Primary Care Physician: [name] ULT, [name] Other Clinician: Referring Physician: Treating Physician/Extender: [name] in Treatment: 10 Education Assessment Education Provided To: Patient Education Topics Provided Wound/Skin Impairment: Handouts: Caring  for Your Ulcer Methods: Explain/Verbal Responses: Reinforcements needed Electronic Signature(s) Signed: 07/30/2023 4:22:55 PM By: Travis Stall RN, BSN Entered By: [name] on 07/30/2023 16:04:25 -------------------------------------------------------------------------------- Wound Assessment Details Patient Name: Date of Service: [name] 07/30/2023 3:30 PM Medical Record Number: [id number] Patient Account Number: [id number] Date of Birth/Sex: Treating RN: 11/10/1990 (32 y.o. M) Primary Care Archer Vise: [name] ULT, PRO [name] Other ClinicianJani Gravel ([id number]) [id number] Page 7 of 9 Referring Dierdre Mccalip: Treating Jaydin Boniface/Extender: [name] in Treatment: 10 Wound Status Wound Number: 1 Primary Etiology: Calciphylaxis Wound Location: Left, Lateral Lower Leg Wound Status: Open Wounding Event: Bite Comorbid History: Hypertension, End Stage Renal Disease Date Acquired: 03/09/2023 Weeks Of Treatment: 10 Clustered Wound: No Photos Wound Measurements Length: (cm) 3 Width: (cm) 1.4 Depth: (cm) 0.1 Area: (cm) 3.299 Volume: (cm) 0.33 % Reduction in Area: 92% % Reduction in Volume: 96% Epithelialization: Medium (34-66%) Wound Description Classification: Full Thickness Without Exposed Support Structures Wound Margin: Distinct, outline attached Exudate Amount: Medium Exudate Type: Serosanguineous Exudate Color: red, brown Foul Odor After Cleansing: No Slough/Fibrino Yes Wound Bed Granulation Amount: Medium (34-66%) Exposed Structure Granulation Quality: Red, Pink Fat Layer (Subcutaneous Tissue) Exposed: Yes Necrotic Amount: Medium (34-66%) Necrotic Quality: Eschar, Adherent Slough Periwound Skin Texture Texture Color No Abnormalities Noted: No No Abnormalities Noted: Yes Scarring: Yes Temperature / Pain Temperature: No Abnormality Moisture No Abnormalities Noted: No Tenderness on  Palpation: Yes Dry / Scaly: No Maceration: No Treatment Notes Wound #1 (Lower Leg) Wound Laterality: Left, Lateral Cleanser Soap and Water Discharge Instruction: May shower and wash wound with dial antibacterial soap and water prior to dressing change. Vashe 5.8 (oz) Discharge Instruction: Cleanse the wound with Vashe prior  to applying a clean dressing using gauze sponges, not tissue or cotton balls. Peri-Wound Care Triamcinolone 15 (g) Discharge Instruction: Use triamcinolone mix and zinc oxide Zinc Oxide Ointment 30g tube Discharge Instruction: Apply Zinc Oxide and TCA cream to periwound with each dressing change Silver Sulfadiazine Cream 1%, 25 (g), tube Discharge Instruction: to wound. ARTHOR, STELMA ([id number]) [id number] Page 8 of 9 Topical Primary Dressing Hydrofera Blue Ready Transfer Foam, 4x5 (in/in) Discharge Instruction: Apply to wound bed as instructed Secondary Dressing Zetuvit Plus Silicone Border Dressing 5x5 (in/in) Discharge Instruction: Apply silicone border over primary dressing as directed. Secured With Compression Wrap Compression Stockings Facilities manager) Signed: 07/30/2023 4:33:59 PM By: [name] Entered By: [name] on 07/30/2023 15:52:33 -------------------------------------------------------------------------------- Wound Assessment Details Patient Name: Date of Service: [name] 07/30/2023 3:30 PM Medical Record Number: [id number] Patient Account Number: [id number] Date of Birth/Sex: Treating RN: 07/30/1991 (32 y.o. Harlon Flor, Millard.Loa Primary Care Francois Elk: [name] ULT, [name] Other Clinician: Referring Ramyah Pankowski: Treating Araceli Coufal/Extender: [name] in Treatment: 10 Wound Status Wound Number: 2 Primary Etiology: Calciphylaxis Wound Location: Left, Medial Ankle Wound Status: Healed - Epithelialized Wounding Event: Bite Comorbid History:  Hypertension, End Stage Renal Disease Date Acquired: 03/09/2023 Weeks Of Treatment: 10 Clustered Wound: No Photos Wound Measurements Length: (cm) Width: (cm) Depth: (cm) Area: (cm) Volume: (cm) 0 % Reduction in Area: 100% 0 % Reduction in Volume: 100% 0 Epithelialization: Medium (34-66%) 0 0 Wound Description Classification: Full Thickness Without Exposed Support Wound Margin: Distinct, outline attached Exudate Amount: Small Exudate Type: Serosanguineous Exudate Color: red, brown Coffield, Elias ([id number]) Wound Bed Granulation Amount: Small (1-33%) Granulation Quality: Red, Pink Necrotic Amount: Large (67-100% Necrotic Quality: Eschar Structures Foul Odor After Cleansing: No Slough/Fibrino Yes [phone].pdf Page 9 of 9 Exposed Structure Fat Layer (Subcutaneous Tissue) Exposed: Yes ) Periwound Skin Texture Texture Color No Abnormalities Noted: Yes No Abnormalities Noted: Yes Moisture Temperature / Pain No Abnormalities Noted: Yes Temperature: No Abnormality Tenderness on Palpation: Yes Treatment Notes Wound #2 (Ankle) Wound Laterality: Left, Medial Cleanser Peri-Wound Care Topical Primary Dressing Secondary Dressing Secured With Compression Wrap Compression Stockings Add-Ons Electronic Signature(s) Signed: 07/30/2023 4:22:55 PM By: Travis Stall RN, BSN Entered By: [name] on 07/30/2023 16:00:46 -------------------------------------------------------------------------------- Vitals Details Patient Name: Date of Service: [place] RTA Marilynne Halsted Pineda 07/30/2023 3:30 PM Medical Record Number: [id number] Patient Account Number: [id number] Date of Birth/Sex: Treating RN: 11/26/1990 (32 y.o. M) Primary Care Kaydynce Pat: [name] ULT, [name] Other Clinician: Referring Wiley Flicker: Treating James Lafalce/Extender: [name] in Treatment: 10 Vital Signs Time Taken: 03:44 Temperature (F): 98.4 Height (in):  71 Pulse (bpm): 75 Weight (lbs): 184 Respiratory Rate (breaths/min): 18 Body Mass Index (BMI): 25.7 Blood Pressure (mmHg): 135/83 Reference Range: 80 - 120 mg / dl Electronic Signature(s) Signed: 07/30/2023 4:33:59 PM By: [name] Entered By: [name] on 07/30/2023 15:44:18

## 2023-07-30 NOTE — Progress Notes (Signed)
KANTA, HARTKE ([id number]) [id number] Page 1 of 9 Visit Report for 07/30/2023 Chief Complaint Document Details Patient Name: Date of Service: [name] 07/30/2023 3:30 PM Medical Record Number: [id number] Patient Account Number: [id number] Date of Birth/Sex: Treating RN: 22-Oct-1990 (32 y.o. 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 [name], Millard.Loa Primary Care Provider: [name], [name] Other Clinician: Referring Provider: Treating Provider/Extender: [name] in Treatment: 10 Debridement Performed for Assessment: Wound #1 Left,Lateral Lower Leg Performed By: Physician Duanne Guess, MD The following information was scribed by: Shawn Stall The information was scribed for: Duanne Guess Debridement Type: Debridement Level of Consciousness (Pre-procedure): Awake and Alert Pre-procedure Verification/Time Out Yes - 16:00 Taken: Start Time: 16:01 Pain Control: Lidocaine 4% T opical Solution Percent of Wound Bed Debrided: 50% T Area Debrided (cm): otal 1.65 Tissue and other material debrided: Viable, Non-Viable, Slough, Subcutaneous, Slough Level: Skin/Subcutaneous Tissue Debridement Description: Excisional Instrument:  Curette Bleeding: Minimum Hemostasis Achieved: Pressure End Time: 16:03 Procedural Pain: 0 Post Procedural Pain: 0 Response to Treatment: Procedure was tolerated well Level of Consciousness (Post- Awake and Alert procedure): Post Debridement Measurements of Total Wound Length: (cm) 3 Width: (cm) 1.4 Depth: (cm) 0.1 Volume: (cm) 0.33 Sells, [name] ([id number]) [id number] Page 2 of 9 Character of Wound/Ulcer Post Debridement: Improved Post Procedure Diagnosis Same as Pre-procedure Electronic Signature(s) Signed: 07/30/2023 4:22:55 PM By: Shawn Stall RN, BSN Signed: 07/30/2023 4:46:47 PM By: [name] Entered By: Shawn Stall on 07/30/2023 16:04:02 -------------------------------------------------------------------------------- HPI Details Patient Name: Date of Service: [name] 07/30/2023 3:30 PM Medical Record Number: [id number] Patient Account Number: [id number] Date of Birth/Sex: Treating RN: 07/07/1991 (32 y.o. M) Primary Care Provider: [name], [name] Other Clinician: Referring Provider: Treating Provider/Extender: [name] in Treatment: 10 History of Present Illness HPI Description: ADMISSION 05/21/2023 This is a 32 year old man with end-stage renal disease on hemodialysis. In July of this year, he was seen by podiatry for "cellulitis with an open wound on his left foot and ankle.". He apparently was given clindamycin and mupirocin by urgent care prior to his visit with Dr. Annamary Rummage. At that time, the wound was felt to be improving on the prescribed regimen. About 3 weeks later, the wound was worsening. 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 The patient has presented to the emergency department in [facility] several times due to increased pain. He  was apparently referred to plastic surgery from the ED. He saw Dr. 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 According to the dialysis rounding note from Tuesday of this week, sodium thiosulfate is being empirically initiated. I still do not see that a biopsy has yet been performed. He is not currently taking Sensipar, as far  as I can see. His intact PTH is actually not particularly high with his last value being 173 and his calcium phosphorus product only 55, although his phosphorus has only recently started to come under control with previous values in the 7-8 range. 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 05/27/2023: The wounds actually look better this week. There has been some epithelialization. The biopsy site has a fair amount of slough accumulation. The wounds are still quite painful. He reports that they have definitely started sodium thiosulfate at his dialysis sessions and he received a call from [facility] regarding the referral for parathyroidectomy, but he was unable to answer and will be calling them back. No results from the biopsy so far. 06/05/2023: The wounds continue to improve. There is more epithelialization. There is slough buildup on the wound surfaces, particularly at the biopsy site. 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 The pattern correlated with stasis dermatitis or trauma. 06/12/2023: The lateral wound is looking quite a bit better this week. Multiple small pockmarks have healed. There is a fair amount of slough on the wound surface and the biopsy site still has quite a bit of depth to it. The medial ulcers are exquisitely painful and they appear to have opened up slightly, but they are clean without any evidence of  infection. He did meet with endocrine surgery at [facility], but given the negative biopsy for calciphylaxis, no surgical intervention was advised. He has not heard anything from vascular surgery regarding the scheduling of his venous reflux studies. 06/17/2023: Both wounds are filling in with good epithelialized tissue. They still have slough on the wound surfaces. They continue to be exquisitely painful. His venous reflux studies are scheduled for this Friday. 06/24/2023: The wounds continue to epithelialize. He does accumulate a bit of slough on the open surfaces but the medial ankle wound is nearly closed. They are far less painful than they have been, as well. His venous reflux studies were performed and were negative. 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 07/14/2023: The medial ankle wound has a layer of eschar over it, underneath which there is a tiny residual opening. The lateral aspect has epithelialized significantly, but he is complaining of pruritus. There does appear to be some irritation around the wound edges. 07/30/2023: The medial ankle wound is healed. The lateral ankle wound has epithelialized considerably with just a few small open areas. He does have some periwound breakdown, however, that seems secondary to irritation of some sort. Electronic Signature(s) Signed: 07/30/2023 4:28:54 PM By: [name] Entered By: Duanne Guess on 07/30/2023 16:28:54 [name] ([id number]) [id number] Page 3 of 9 -------------------------------------------------------------------------------- Physical Exam Details Patient Name: Date of Service: [name] 07/30/2023 3:30 PM Medical Record Number: [id number] Patient Account Number: [id number] Date of Birth/Sex: Treating RN: [birth year] (32 y.o. M) Primary Care Provider: [name], [name]  Other Clinician: Referring Provider: Treating Provider/Extender: [name] in Treatment: 10 Constitutional . . . . no acute distress. Respiratory Normal work of breathing on room air.. Notes 07/30/2023: The medial ankle wound is healed. The lateral ankle wound has epithelialized considerably with just a few small open areas. He does have some periwound breakdown, however, that seems secondary to irritation of some sort. Electronic Signature(s) Signed: 07/30/2023 4:29:21  PM By: [name] Entered By: Duanne Guess on 07/30/2023 16:29:20 -------------------------------------------------------------------------------- Physician Orders Details Patient Name: Date of Service: [name] 07/30/2023 3:30 PM Medical Record Number: [id number] Patient Account Number: [id number] Date of Birth/Sex: Treating RN: 10/21/1990 (32 y.o. [name] Primary Care Provider: [name], PRO [name] Other Clinician: Referring Provider: Treating Provider/Extender: [name] in Treatment: 10 The following information was scribed by: Shawn Stall The information was scribed for: Duanne Guess Verbal / Phone Orders: No Diagnosis Coding ICD-10 Coding Code Description L97.322 Non-pressure chronic ulcer of left ankle with fat layer exposed I10 Essential (primary) hypertension N18.6 End stage renal disease Z99.2 Dependence on renal dialysis Follow-up Appointments Return appointment in 3 weeks. - with Dr. Lady Gary Other: - +++Pick up TCA cream at pharmacy. Anesthetic Wound #1 Left,Lateral Lower Leg (In clinic) Topical Lidocaine 5% applied to wound bed (In clinic) Topical Lidocaine 4% applied to wound bed Bathing/ Shower/ Hygiene May shower and wash wound with soap and water. Off-Loading Crutches for Offloading - Please use two crutches for offloading. IVOR, BLOYD ([id number])  [id number] Page 4 of 9 Additional Orders / Instructions Other: - May purchase hydrocortisone cream to use on periwound area at home. Wound Treatment Wound #1 - Lower Leg Wound Laterality: Left, Lateral Cleanser: Soap and Water 1 x Per Day/30 Days Discharge Instructions: May shower and wash wound with dial antibacterial soap and water prior to dressing change. Cleanser: Vashe 5.8 (oz) 1 x Per Day/30 Days Discharge Instructions: Cleanse the wound with Vashe prior to applying a clean dressing using gauze sponges, not tissue or cotton balls. Peri-Wound Care: Triamcinolone 15 (g) 1 x Per Day/30 Days Discharge Instructions: Use triamcinolone mix and zinc oxide Peri-Wound Care: Zinc Oxide Ointment 30g tube 1 x Per Day/30 Days Discharge Instructions: Apply Zinc Oxide and TCA cream to periwound with each dressing change Peri-Wound Care: Silver Sulfadiazine Cream 1%, 25 (g), tube (Generic) 1 x Per Day/30 Days Discharge Instructions: to wound. Prim Dressing: Hydrofera Blue Ready Transfer Foam, 4x5 (in/in) (Generic) 1 x Per Day/30 Days ary Discharge Instructions: Apply to wound bed as instructed Secondary Dressing: Zetuvit Plus Silicone Border Dressing 5x5 (in/in) (Generic) 1 x Per Day/30 Days Discharge Instructions: Apply silicone border over primary dressing as directed. Patient Medications llergies: No Known Allergies A Notifications Medication Indication Start End 07/30/2023 triamcinolone acetonide DOSE topical 0.1 % cream - Apply as directed, along with zinc oxide, to periwound skin with dressing changes Electronic Signature(s) Signed: 07/30/2023 4:46:47 PM By: [name] Previous Signature: 07/30/2023 4:31:20 PM Version By: [name] Previous Signature: 07/30/2023 4:22:55 PM Version By: Shawn Stall RN, BSN Entered By: Duanne Guess on 07/30/2023 16:34:58 Prescription  07/30/2023 -------------------------------------------------------------------------------- Travis Bene MD Patient Name: Provider: 1991/04/26 [id number] Date of Birth: NPI#: [id number] Sex: DEA #: [phone] Phone #: License #: UPN: Patient Address: [address] Eligha Bridegroom [facility] [place] [address] , [address] [place], [place] [address] [phone] Allergies No Known Allergies Provider's Orders Crutches for Offloading - Please use two crutches for offloading. Hand Signature: Date(s): RANULFO, GEDDIE ([id number]) [id number] Page 5 of 9 Electronic Signature(s) Signed: 07/30/2023 4:46:47 PM By: [name] Previous Signature: 07/30/2023 4:22:55 PM Version By: Shawn Stall RN, BSN Entered By: Duanne Guess on 07/30/2023 16:34:58 -------------------------------------------------------------------------------- Problem List Details Patient Name: Date of Service: CA RTA GENA, JO HA Pineda 07/30/2023 3:30  PM Medical Record Number: [id number] Patient Account Number: [id number] Date of Birth/Sex: Treating RN: 1991-07-18 (32 y.o. M) Primary Care Provider: [name], [name] Other Clinician: Referring Provider: Treating Provider/Extender: [name] in Treatment: 10 Active Problems ICD-10 Encounter Code Description Active Date MDM Diagnosis L97.322 Non-pressure chronic ulcer of left ankle with fat layer exposed 05/21/2023 No Yes I10 Essential (primary) hypertension 05/21/2023 No Yes N18.6 End stage renal disease 05/21/2023 No Yes Z99.2 Dependence on renal dialysis 05/21/2023 No Yes Inactive Problems Resolved Problems Electronic Signature(s) Signed: 07/30/2023 4:27:46 PM By: [name] Entered By: Duanne Guess on 07/30/2023  16:27:46 -------------------------------------------------------------------------------- Progress Note Details Patient Name: Date of Service: [name] 07/30/2023 3:30 PM Medical Record Number: [id number] Patient Account Number: [id number] Date of Birth/Sex: Treating RN: [birth year] (32 y.o. 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 In July of this year, he was seen by podiatry for "cellulitis with an open wound on his left foot and ankle.". He apparently was given clindamycin and mupirocin by urgent care prior to his visit with Dr. Annamary Rummage. At that time, the wound was felt to be improving on the prescribed regimen. About 3 weeks later, the wound was worsening. 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 The patient has presented to the emergency department in [facility] several times due to increased pain. He was apparently referred to plastic surgery from the ED. He saw Dr. 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 According to the dialysis rounding note from Tuesday of this week, sodium thiosulfate is being empirically initiated. I still do not see that a biopsy has yet been performed. He is not currently taking Sensipar, as far as I can see. His intact PTH is actually not  particularly high with his last value being 173 and his calcium phosphorus product only 55, although his phosphorus has only recently started to come under control with previous values in the 7-8 range. 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 05/27/2023: The wounds actually look better this week. There has been some epithelialization. The biopsy site has a fair amount of slough accumulation. The wounds are still quite painful. He reports that they have definitely started sodium thiosulfate at his dialysis sessions and he received a call from [facility] regarding the referral for parathyroidectomy, but he was unable to answer and will be calling them back. No results from the biopsy so far. 06/05/2023: The wounds continue to improve. There is more epithelialization. There is slough buildup on the wound surfaces, particularly at the biopsy site. 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 The pattern correlated with stasis dermatitis or trauma. 06/12/2023: The lateral wound is looking quite a bit better this week. Multiple small pockmarks have healed. There is a fair amount of slough on the wound surface and the biopsy  site still has quite a bit of depth to it. The medial ulcers are exquisitely painful and they appear to have opened up slightly, but they are clean without any evidence of infection. He did meet with endocrine surgery at [facility], but given the negative biopsy for calciphylaxis, no surgical intervention was advised. He has not heard anything from vascular surgery regarding the scheduling of his venous reflux studies. 06/17/2023: Both wounds are filling in with good epithelialized tissue. They still have slough on the wound surfaces. They continue to be exquisitely painful. His venous reflux studies are scheduled for this Friday. 06/24/2023:  The wounds continue to epithelialize. He does accumulate a bit of slough on the open surfaces but the medial ankle wound is nearly closed. They are far less painful than they have been, as well. His venous reflux studies were performed and were negative. 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 07/14/2023: The medial ankle wound has a layer of eschar over it, underneath which there is a tiny residual opening. The lateral aspect has epithelialized significantly, but he is complaining of pruritus. There does appear to be some irritation around the wound edges. 07/30/2023: The medial ankle wound is healed. The lateral ankle wound has epithelialized considerably with just a few small open areas. He does have some periwound breakdown, however, that seems secondary to irritation of some sort. Patient History Information obtained from Chart. Family History Diabetes - Mother, Heart Disease - Mother, Hypertension - Mother, Kidney Disease - Siblings. Social History Former smoker - ended on 03/09/2019, Alcohol Use - Never, Drug Use - No History, Caffeine Use - Never. Medical History Cardiovascular Patient has history of Hypertension Genitourinary Patient has history of End Stage Renal Disease Hospitalization/Surgery History - av fistula placement. - EGD. Medical A Surgical History Notes nd Genitourinary GI bleed, ulcer Psychiatric anxiety, depression Objective Constitutional no acute distress. Vitals Time Taken: 3:44 AM, Height: 71 in, Weight: 184 lbs, BMI: 25.7, Temperature: 98.4 F, Pulse: 75 bpm, Respiratory Rate: 18 breaths/min, Blood Pressure: 135/83 mmHg. Respiratory Normal work of breathing on room air.. General Notes: 07/30/2023: The medial ankle wound is healed. The lateral ankle wound has epithelialized considerably with just a few small open areas. He does have some periwound breakdown, however, that seems secondary  to irritation of some sort. [name], [name] ([id number]) [id number] Page 7 of 9 Integumentary (Hair, Skin) Wound #1 status is Open. Original cause of wound was Bite. The date acquired was: 03/09/2023. The wound has been in treatment 10 weeks. The wound is located on the Left,Lateral Lower Leg. The wound measures 3cm length x 1.4cm width x 0.1cm depth; 3.299cm^2 area and 0.33cm^3 volume. There is Fat Layer (Subcutaneous Tissue) exposed. There is a medium amount of serosanguineous drainage noted. The wound margin is distinct with the outline attached to the wound base. There is medium (34-66%) red, pink granulation within the wound bed. There is a medium (34-66%) amount of necrotic tissue within the wound bed including Eschar and Adherent Slough. The periwound skin appearance had no abnormalities noted for color. The periwound skin appearance exhibited: Scarring. The periwound skin appearance did not exhibit: Dry/Scaly, Maceration. Periwound temperature was noted as No Abnormality. The periwound has tenderness on palpation. Wound #2 status is Healed - Epithelialized. Original cause of wound was Bite. The date acquired was: 03/09/2023. The wound has been in treatment 10 weeks. The wound is located on the Left,Medial Ankle. The  wound measures 0cm length x 0cm width x 0cm depth; 0cm^2 area and 0cm^3 volume. There is Fat Layer (Subcutaneous Tissue) exposed. There is a small amount of serosanguineous drainage noted. The wound margin is distinct with the outline attached to the wound base. There is small (1-33%) red, pink granulation within the wound bed. There is a large (67-100%) amount of necrotic tissue within the wound bed including Eschar. The periwound skin appearance had no abnormalities noted for texture. The periwound skin appearance had no abnormalities noted for moisture. The periwound skin appearance had no abnormalities noted for color. Periwound temperature was noted as  No Abnormality. The periwound has tenderness on palpation. Assessment Active Problems ICD-10 Non-pressure chronic ulcer of left ankle with fat layer exposed Essential (primary) hypertension End stage renal disease Dependence on renal dialysis Procedures Wound #1 Pre-procedure diagnosis of Wound #1 is a Calciphylaxis located on the Left,Lateral Lower Leg . There was a Excisional Skin/Subcutaneous Tissue Debridement with a total area of 1.65 sq cm performed by Duanne Guess, MD. With the following instrument(s): Curette to remove Viable and Non-Viable tissue/material. Material removed includes Subcutaneous Tissue and Slough and after achieving pain control using Lidocaine 4% T opical Solution. A time out was conducted at 16:00, prior to the start of the procedure. A Minimum amount of bleeding was controlled with Pressure. The procedure was tolerated well with a pain level of 0 throughout and a pain level of 0 following the procedure. Post Debridement Measurements: 3cm length x 1.4cm width x 0.1cm depth; 0.33cm^3 volume. Character of Wound/Ulcer Post Debridement is improved. Post procedure Diagnosis Wound #1: Same as Pre-Procedure Plan Follow-up Appointments: Return appointment in 3 weeks. - with Dr. Lady Gary Other: - +++Pick up TCA cream at pharmacy. Anesthetic: Wound #1 Left,Lateral Lower Leg: (In clinic) Topical Lidocaine 5% applied to wound bed (In clinic) Topical Lidocaine 4% applied to wound bed Bathing/ Shower/ Hygiene: May shower and wash wound with soap and water. Off-Loading: Crutches for Offloading - Please use two crutches for offloading. Additional Orders / Instructions: Other: - May purchase hydrocortisone cream to use on periwound area at home. The following medication(s) was prescribed: triamcinolone acetonide topical 0.1 % cream Apply as directed, along with zinc oxide, to periwound skin with dressing changes starting 07/30/2023 WOUND #1: - Lower Leg Wound Laterality:  Left, Lateral Cleanser: Soap and Water 1 x Per Day/30 Days Discharge Instructions: May shower and wash wound with dial antibacterial soap and water prior to dressing change. Cleanser: Vashe 5.8 (oz) 1 x Per Day/30 Days Discharge Instructions: Cleanse the wound with Vashe prior to applying a clean dressing using gauze sponges, not tissue or cotton balls. Peri-Wound Care: Triamcinolone 15 (g) 1 x Per Day/30 Days Discharge Instructions: Use triamcinolone mix and zinc oxide Peri-Wound Care: Zinc Oxide Ointment 30g tube 1 x Per Day/30 Days Discharge Instructions: Apply Zinc Oxide and TCA cream to periwound with each dressing change Peri-Wound Care: Silver Sulfadiazine Cream 1%, 25 (g), tube (Generic) 1 x Per Day/30 Days Discharge Instructions: to wound. Prim Dressing: Hydrofera Blue Ready Transfer Foam, 4x5 (in/in) (Generic) 1 x Per Day/30 Days ary Discharge Instructions: Apply to wound bed as instructed Secondary Dressing: Zetuvit Plus Silicone Border Dressing 5x5 (in/in) (Generic) 1 x Per Day/30 Days Discharge Instructions: Apply silicone border over primary dressing as directed. [name], [name] ([id number]) [id number] Page 8 of 9 07/30/2023: The medial ankle wound is healed. The lateral ankle wound has epithelialized considerably with just a few small open areas. He does have some periwound  breakdown, however, that seems secondary to irritation of some sort. I used a curette to debride slough and subcutaneous tissue from the lateral ankle wound. As he has done so well with that, we will continue the Silvadene and Hydrofera Blue here. I am also going to prescribe some triamcinolone which he can then mix with zinc oxide to apply to the periwound. Due to clinic scheduling and provider availability, he will follow-up in 3 weeks. Electronic Signature(s) Signed: 07/30/2023 4:35:55 PM By: [name] Entered By: Duanne Guess on 07/30/2023  16:35:55 -------------------------------------------------------------------------------- HxROS Details Patient Name: Date of Service: [name] 07/30/2023 3:30 PM Medical Record Number: [id number] Patient Account Number: [id number] Date of Birth/Sex: Treating RN: 1991/03/16 (32 y.o. M) Primary Care Provider: [name], [name] Other Clinician: Referring Provider: Treating Provider/Extender: [name] in Treatment: 10 Information Obtained From Chart Cardiovascular Medical History: Positive for: Hypertension Genitourinary Medical History: Positive for: End Stage Renal Disease Past Medical History Notes: GI bleed, ulcer Psychiatric Medical History: Past Medical History Notes: anxiety, depression Immunizations Pneumococcal Vaccine: Received Pneumococcal Vaccination: No Implantable Devices No devices added Hospitalization / Surgery History Type of Hospitalization/Surgery av fistula placement EGD Family and Social History Diabetes: Yes - Mother; Heart Disease: Yes - Mother; Hypertension: Yes - Mother; Kidney Disease: Yes - Siblings; Former smoker - ended on 03/09/2019; Alcohol Use: Never; Drug Use: No History; Caffeine Use: Never; Financial Concerns: No; Food, Clothing or Shelter Needs: No; Support System Lacking: No; Transportation Concerns: No Electronic Signature(s) Signed: 07/30/2023 4:46:47 PM By: [name] Entered By: Duanne Guess on 07/30/2023 16:29:00 [name] ([id number]) [id number] Page 9 of 9 -------------------------------------------------------------------------------- SuperBill Details Patient Name: Date of Service: [name] 07/30/2023 Medical Record Number: [id number] Patient Account Number: [id number] Date of Birth/Sex: Treating RN: 02/16/1991 (32 y.o. M) Primary Care Provider: [name], [name] Other Clinician: Referring Provider: Treating  Provider/Extender: [name] in Treatment: 10 Diagnosis Coding ICD-10 Codes Code Description [phone] Non-pressure chronic ulcer of left ankle with fat layer exposed I10 Essential (primary) hypertension N18.6 End stage renal disease Z99.2 Dependence on renal dialysis Facility Procedures : CPT4 Code: 84132440 Description: 11042 - DEB SUBQ TISSUE 20 SQ CM/< ICD-10 Diagnosis Description L97.322 Non-pressure chronic ulcer of left ankle with fat layer exposed Modifier: Quantity: 1 Physician Procedures : CPT4 Code Description Modifier [id number] 99214 - WC PHYS LEVEL 4 - EST PT ICD-10 Diagnosis Description L97.322 Non-pressure chronic ulcer of left ankle with fat layer exposed I10 Essential (primary) hypertension N18.6 End stage renal disease Z99.2  Dependence on renal dialysis Quantity: 1 : [id number] 11042 - WC PHYS SUBQ TISS 20 SQ CM ICD-10 Diagnosis Description L97.322 Non-pressure chronic ulcer of left ankle with fat layer exposed Quantity: 1 Electronic Signature(s) Signed: 07/30/2023 4:36:13 PM By: [name] Entered By: Duanne Guess on 07/30/2023 16:36:13

## 2023-08-28 ENCOUNTER — Encounter (HOSPITAL_BASED_OUTPATIENT_CLINIC_OR_DEPARTMENT_OTHER): Payer: Medicare Other | Attending: General Surgery | Admitting: General Surgery

## 2023-08-28 DIAGNOSIS — I120 Hypertensive chronic kidney disease with stage 5 chronic kidney disease or end stage renal disease: Secondary | ICD-10-CM | POA: Diagnosis not present

## 2023-08-28 DIAGNOSIS — L97322 Non-pressure chronic ulcer of left ankle with fat layer exposed: Secondary | ICD-10-CM | POA: Insufficient documentation

## 2023-08-28 DIAGNOSIS — N186 End stage renal disease: Secondary | ICD-10-CM | POA: Diagnosis not present

## 2023-08-28 DIAGNOSIS — Z992 Dependence on renal dialysis: Secondary | ICD-10-CM | POA: Diagnosis not present

## 2023-08-29 NOTE — Progress Notes (Signed)
[name], [name] ([id number]) [id number] Page 1 of 6 Visit Report for 08/28/2023 Chief Complaint Document Details Patient Name: Date of Service: [name] 08/28/2023 11:30 A M Medical Record Number: [id number] Patient Account Number: [id number] Date of Birth/Sex: Treating RN: 01-Jul-1991 (32 y.o. 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 M) Primary Care Provider: [name], [name] Other Clinician: Referring Provider: Treating Provider/Extender: [name] in Treatment: 14 History of Present Illness HPI Description: ADMISSION 05/21/2023 This is a 32 year old man with end-stage renal disease on hemodialysis. In July of this year, he was seen by podiatry for "cellulitis with an open wound on his left foot and ankle.". He apparently was given clindamycin and mupirocin by urgent care prior to his visit with Dr. Annamary Rummage. At that time, the wound was felt to be improving on the prescribed regimen. About 3 weeks later, the wound was worsening. 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 The patient has presented to the emergency  department in [facility] several times due to increased pain. He was apparently referred to plastic surgery from the ED. He saw Dr. 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 According to the dialysis rounding note from Tuesday of this week, sodium thiosulfate is being empirically initiated. I still do not see that a biopsy has yet been performed. He is not currently taking Sensipar, as far as I can see. His intact PTH is actually not particularly high with his last value being 173 and his calcium phosphorus product only 55, although his phosphorus has only recently started to come under control with previous values in the 7-8 range. 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 05/27/2023: The wounds actually look better this week. There has been some epithelialization. The biopsy site has a fair amount of slough accumulation. The wounds are still quite painful. He reports that they have definitely started sodium thiosulfate at his dialysis sessions and he received a call from [facility] regarding the referral for parathyroidectomy, but he was unable to answer and will be calling them back. No results from the biopsy so far. 06/05/2023: The wounds continue to improve. There is more epithelialization. There is slough buildup on the wound surfaces, particularly at the biopsy site. 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 The pattern correlated with stasis dermatitis or trauma. 06/12/2023: The lateral wound is looking quite a bit better this week. Multiple small pockmarks have healed. There is a fair amount of slough on the wound surface and the biopsy site still has quite a bit of depth to it. The medial ulcers are exquisitely painful and they appear to have  opened up slightly, but they are clean without  any evidence of infection. He did meet with endocrine surgery at [facility], but given the negative biopsy for calciphylaxis, no surgical intervention was advised. He has not heard anything from vascular surgery regarding the scheduling of his venous reflux studies. 06/17/2023: Both wounds are filling in with good epithelialized tissue. They still have slough on the wound surfaces. They continue to be exquisitely painful. His venous reflux studies are scheduled for this Friday. 06/24/2023: The wounds continue to epithelialize. He does accumulate a bit of slough on the open surfaces but the medial ankle wound is nearly closed. They are far less painful than they have been, as well. His venous reflux studies were performed and were negative. This leads me to believe that the EDGEL, VI ([id number]) [id number] Page 2 of 6 angiodermatitis that was diagnosed on biopsy is secondary to his hypertension. 07/14/2023: The medial ankle wound has a layer of eschar over it, underneath which there is a tiny residual opening. The lateral aspect has epithelialized significantly, but he is complaining of pruritus. There does appear to be some irritation around the wound edges. 07/30/2023: The medial ankle wound is healed. The lateral ankle wound has epithelialized considerably with just a few small open areas. He does have some periwound breakdown, however, that seems secondary to irritation of some sort. 08/28/2023: His wounds are healed. Electronic Signature(s) Signed: 08/28/2023 12:31:26 PM By: [name] Entered By: Duanne Guess on 08/28/2023 08:49:08 -------------------------------------------------------------------------------- Physical Exam Details Patient Name: Date of Service: [name] 08/28/2023 11:30 A M Medical Record Number: [id number] Patient Account Number:  [id number] Date of Birth/Sex: Treating RN: 03/23/1991 (32 y.o. M) Primary Care Provider: [name], [name] Other Clinician: Referring Provider: Treating Provider/Extender: [name] in Treatment: 14 Constitutional . . . . no acute distress. Respiratory Normal work of breathing on room air.. Notes 08/28/2023: His wounds are healed. Electronic Signature(s) Signed: 08/28/2023 12:31:26 PM By: [name] Entered By: Duanne Guess on 08/28/2023 08:49:49 -------------------------------------------------------------------------------- Physician Orders Details Patient Name: Date of Service: [name] 08/28/2023 11:30 A M Medical Record Number: [id number] Patient Account Number: [id number] Date of Birth/Sex: Treating RN: Jan 07, 1991 (32 y.o. [name] Primary Care Provider: [name], PRO [name] Other Clinician: Referring Provider: Treating Provider/Extender: [name] in Treatment: 14 The following information was scribed by: Zenaida Deed The information was scribed for: Duanne Guess Verbal / Phone Orders: No Diagnosis Coding ICD-10 Coding Code Description L97.322 Non-pressure chronic ulcer of left ankle with fat layer exposed I10 Essential (primary) hypertension N18.6 End stage renal disease Z99.2 Dependence on renal dialysis [name], [name] ([id number]) [id number] Page 3 of 6 [facility] Discharge from Wound Care Center Bathing/ Shower/ Hygiene May shower and wash wound with soap and water. Non Wound Condition pply the following to affected area as directed: - moisturizing lotion to legs daily A Electronic Signature(s) Signed: 08/28/2023 12:31:26 PM By: [name] Entered By: Duanne Guess on 08/28/2023 08:50:15 -------------------------------------------------------------------------------- Problem  List Details Patient Name: Date of Service: [name] 08/28/2023 11:30 A M Medical Record Number: [id number] Patient Account Number: [id number] Date of Birth/Sex: Treating RN: 05/17/1991 (32 y.o. M) Primary Care Provider: [name], [name] Other Clinician: Referring Provider: Treating Provider/Extender: [name] in Treatment: 14 Active Problems ICD-10 Encounter Code Description Active Date MDM Diagnosis L97.322 Non-pressure chronic ulcer of left  ankle with fat layer exposed 05/21/2023 No Yes I10 Essential (primary) hypertension 05/21/2023 No Yes N18.6 End stage renal disease 05/21/2023 No Yes Z99.2 Dependence on renal dialysis 05/21/2023 No Yes Inactive Problems Resolved Problems Electronic Signature(s) Signed: 08/28/2023 12:31:26 PM By: [name] Entered By: Duanne Guess on 08/28/2023 08:48:41 -------------------------------------------------------------------------------- Progress Note Details Patient Name: Date of Service: [name] 08/28/2023 11:30 A M Medical Record Number: [id number] Patient Account Number: [id number] Date of Birth/Sex: Treating RN: 02/23/1991 (32 y.o. 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 In July of this year, he was seen by podiatry for "cellulitis with an open wound on his left foot and ankle.". He apparently was given clindamycin and mupirocin by urgent care prior to his visit with Dr. Annamary Rummage. At that  time, the wound was felt to be improving on the prescribed regimen. About 3 weeks later, the wound was worsening. 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 The patient has presented to the emergency department in [facility] several times due to increased pain. He was apparently referred to plastic surgery from the ED. He saw Dr. 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 According to the dialysis rounding note from Tuesday of this week, sodium thiosulfate is being empirically initiated. I still do not see that a biopsy has yet been performed. He is not currently taking Sensipar, as far as I can see. His intact PTH is actually not particularly high with his last value being 173 and his calcium phosphorus product only 55, although his phosphorus has only recently started to come under control with previous values in the 7-8 range. 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 05/27/2023: The wounds actually look better this week. There has been some epithelialization. The biopsy site has a fair amount of slough accumulation. The wounds are still quite painful. He reports that they have definitely started sodium thiosulfate at his dialysis sessions and he received a call from [facility] regarding the referral for parathyroidectomy, but he was unable to answer and will be calling them back. No results from the biopsy so far. 06/05/2023: The wounds continue to improve. There is more epithelialization. There is slough buildup on the wound surfaces, particularly at the biopsy site. 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 The pattern correlated  with stasis dermatitis or trauma. 06/12/2023: The lateral wound is looking quite a bit better this week. Multiple small pockmarks have healed. There is a fair amount of slough on the wound surface and the biopsy site still has quite a bit of depth to it. The medial ulcers are exquisitely painful and they appear to have opened up slightly, but they are clean without any evidence of infection. He did meet with endocrine surgery at [facility], but given the negative biopsy for calciphylaxis, no surgical intervention  was advised. He has not heard anything from vascular surgery regarding the scheduling of his venous reflux studies. 06/17/2023: Both wounds are filling in with good epithelialized tissue. They still have slough on the wound surfaces. They continue to be exquisitely painful. His venous reflux studies are scheduled for this Friday. 06/24/2023: The wounds continue to epithelialize. He does accumulate a bit of slough on the open surfaces but the medial ankle wound is nearly closed. They are far less painful than they have been, as well. His venous reflux studies were performed and were negative. 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 07/14/2023: The medial ankle wound has a layer of eschar over it, underneath which there is a tiny residual opening. The lateral aspect has epithelialized significantly, but he is complaining of pruritus. There does appear to be some irritation around the wound edges. 07/30/2023: The medial ankle wound is healed. The lateral ankle wound has epithelialized considerably with just a few small open areas. He does have some periwound breakdown, however, that seems secondary to irritation of some sort. 08/28/2023: His wounds are healed. Objective Constitutional no acute distress. Vitals Time Taken: 11:40 AM, Height: 71 in, Weight: 184 lbs, BMI: 25.7, Temperature: 98 F, Pulse:  70 bpm, Respiratory Rate: 16 breaths/min, Blood Pressure: 138/76 mmHg. Respiratory Normal work of breathing on room air.. General Notes: 08/28/2023: His wounds are healed. Integumentary (Hair, Skin) Wound #1 status is Healed - Epithelialized. Original cause of wound was Bite. The date acquired was: 03/09/2023. The wound has been in treatment 14 weeks. The wound is located on the Left,Lateral Lower Leg. The wound measures 0cm length x 0cm width x 0cm depth; 0cm^2 area and 0cm^3 volume. There is Fat Layer (Subcutaneous Tissue) exposed. There is no tunneling or undermining noted. There is a medium amount of serosanguineous drainage noted. The wound margin is distinct with the outline attached to the wound base. There is medium (34-66%) red, pink granulation within the wound bed. There is a medium (34-66%) amount of necrotic tissue within the wound bed including Eschar. The periwound skin appearance had no abnormalities noted for color. The periwound skin appearance exhibited: Scarring. The periwound skin appearance did not exhibit: Dry/Scaly, Maceration. Periwound temperature was noted as No Abnormality. The periwound has tenderness on palpation. [name], [name] ([id number]) [id number] Page 5 of 6 Assessment Active Problems ICD-10 Non-pressure chronic ulcer of left ankle with fat layer exposed Essential (primary) hypertension End stage renal disease Dependence on renal dialysis Plan Discharge From [facility] Services: Discharge from Wound Care Center Bathing/ Shower/ Hygiene: May shower and wash wound with soap and water. Non Wound Condition: Apply the following to affected area as directed: - moisturizing lotion to legs daily 08/28/2023: His wounds are healed. 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 His blood pressure is now under good control. We will discharge him from  the wound care center. He may follow-up in the future, should the need arise. Electronic Signature(s) Signed: 08/28/2023 12:31:26 PM By: [name] Entered By: Duanne Guess on 08/28/2023 08:51:04 -------------------------------------------------------------------------------- SuperBill Details Patient Name: Date of Service: [name] 08/28/2023 Medical Record Number: [id number] Patient Account Number: [id number] Date of Birth/Sex: Treating RN: 07/10/1991 (32 y.o. M) Primary Care Provider: [name], [name] Other Clinician: Referring Provider: Treating Provider/Extender: [name] in Treatment: 14 Diagnosis Coding ICD-10  Codes Code Description L97.322 Non-pressure chronic ulcer of left ankle with fat layer exposed I10 Essential (primary) hypertension N18.6 End stage renal disease Z99.2 Dependence on renal dialysis Facility Procedures : CPT4 Code: [id number] Description: 99213 - WOUND CARE VISIT-LEV 3 EST PT Modifier: Quantity: 1 Physician Procedures : CPT4 Code Description Modifier [id number] 24401 - WC PHYS LEVEL 2 - EST PT ICD-10 Diagnosis Description L97.322 Non-pressure chronic ulcer of left ankle with fat layer exposed I10 Essential (primary) hypertension N18.6 End stage renal disease Z99.2  Dependence on renal dialysis [name], [name] ([id number]) [id number] Pag Quantity: 1 e 6 of 6 Electronic Signature(s) Signed: 08/28/2023 12:31:26 PM By: [name] Signed: 08/28/2023 12:52:17 PM By: Zenaida Deed RN, BSN Entered By: Zenaida Deed on 08/28/2023 08:52:21

## 2023-08-29 NOTE — Progress Notes (Signed)
HAIK, TJARKS ([id number]) [id number] Page 1 of 8 Visit Report for 08/28/2023 Arrival Information Details Patient Name: Date of Service: [name] 08/28/2023 11:30 A M Medical Record Number: [id number] Patient Account Number: [id number] Date of Birth/Sex: Treating RN: 29-Apr-1991 (32 y.o. [name] Primary Care [name], [name] Other Clinician: Referring [name]: Treating [name]/Extender: [name] in Treatment: 14 Visit Information History Since Last Visit Added or deleted any medications: No Patient Arrived: Ambulatory Any new allergies or adverse reactions: No Arrival Time: 11:40 Had a fall or experienced change in No Accompanied By: self activities of daily living that may affect Transfer Assistance: None risk of falls: Patient Identification Verified: Yes Signs or symptoms of abuse/neglect since last visito No Patient Requires Transmission-Based Precautions: No Hospitalized since last visit: No Patient Has Alerts: No Implantable device outside of the clinic excluding No cellular tissue based products placed in the center since last visit: Has Dressing in Place as Prescribed: Yes Pain Present Now: No Electronic Signature(s) Signed: 08/28/2023 12:14:37 PM By: Travis Schwalbe RN Entered By: [name] on 08/28/2023 08:40:27 -------------------------------------------------------------------------------- Clinic Level of Care Assessment Details Patient Name: Date of Service: [name] 08/28/2023 11:30 A M Medical Record Number: [id number] Patient Account Number: [id number] Date of Birth/Sex: Treating RN: [birth year] (32 y.o. [name] Primary Care [name]: [name] Pineda, Travis [name] Other Clinician: Referring [name]: Treating [name]/Extender: [name] in Treatment: 14 Clinic Level of Care Assessment Items TOOL 4  Quantity Score []  - 0 Use when only an EandM is performed on FOLLOW-UP visit ASSESSMENTS - Nursing Assessment / Reassessment X- 1 10 Reassessment of Co-morbidities (includes updates in patient status) X- 1 5 Reassessment of Adherence to Treatment Plan ASSESSMENTS - Wound and Skin A ssessment / Reassessment X - Simple Wound Assessment / Reassessment - one wound 1 5 []  - 0 Complex Wound Assessment / Reassessment - multiple wounds []  - 0 Dermatologic / Skin Assessment (not related to wound area) ASSESSMENTS - Focused Assessment []  - 0 Circumferential Edema Measurements - multi extremities []  - 0 Nutritional Assessment / Counseling / Intervention [name] ([id number]) [id number] Page 2 of 8 X- 1 5 Lower Extremity Assessment (monofilament, tuning fork, pulses) []  - 0 Peripheral Arterial Disease Assessment (using hand held doppler) ASSESSMENTS - Ostomy and/or Continence Assessment and Care []  - 0 Incontinence Assessment and Management []  - 0 Ostomy Care Assessment and Management (repouching, etc.) PROCESS - Coordination of Care X - Simple Patient / Family Education for ongoing care 1 15 []  - 0 Complex (extensive) Patient / Family Education for ongoing care X- 1 10 Staff obtains Chiropractor, Records, T Results / Process Orders est []  - 0 Staff telephones HHA, Nursing Homes / Clarify orders / etc []  - 0 Routine Transfer to another Facility (non-emergent condition) []  - 0 Routine Hospital Admission (non-emergent condition) []  - 0 New Admissions / Manufacturing engineer / Ordering NPWT Apligraf, etc. , []  - 0 Emergency Hospital Admission (emergent condition) X- 1 10 Simple Discharge Coordination []  - 0 Complex (extensive) Discharge Coordination PROCESS - Special Needs []  - 0 Pediatric / Minor Patient Management []  - 0 Isolation Patient Management []  - 0 Hearing / Language / Visual special needs []  - 0 Assessment of Community assistance  (transportation, D/C planning, etc.) []  - 0 Additional assistance / Altered mentation []  - 0 Support Surface(s) Assessment (bed, cushion, seat, etc.) INTERVENTIONS - Wound Cleansing / Measurement X -  Simple Wound Cleansing - one wound 1 5 []  - 0 Complex Wound Cleansing - multiple wounds X- 1 5 Wound Imaging (photographs - any number of wounds) []  - 0 Wound Tracing (instead of photographs) X- 1 5 Simple Wound Measurement - one wound []  - 0 Complex Wound Measurement - multiple wounds INTERVENTIONS - Wound Dressings []  - 0 Small Wound Dressing one or multiple wounds []  - 0 Medium Wound Dressing one or multiple wounds []  - 0 Large Wound Dressing one or multiple wounds []  - 0 Application of Medications - topical []  - 0 Application of Medications - injection INTERVENTIONS - Miscellaneous []  - 0 External ear exam []  - 0 Specimen Collection (cultures, biopsies, blood, body fluids, etc.) []  - 0 Specimen(s) / Culture(s) sent or taken to Lab for analysis []  - 0 Patient Transfer (multiple staff / Nurse, adult / Similar devices) []  - 0 Simple Staple / Suture removal (25 or less) []  - 0 Complex Staple / Suture removal (26 or more) []  - 0 Hypo / Hyperglycemic Management (close monitor of Blood Glucose) [place], [name] ([id number]) [id number] Page 3 of 8 []  - 0 Ankle / Brachial Index (ABI) - do not check if billed separately X- 1 5 Vital Signs Has the patient been seen at the hospital within the last three years: Yes Total Score: 80 Level Of Care: New/Established - Level 3 Electronic Signature(s) Signed: 08/28/2023 12:52:17 PM By: Travis Deed RN, BSN Entered By: [name] on 08/28/2023 08:52:10 -------------------------------------------------------------------------------- Encounter Discharge Information Details Patient Name: Date of Service: [name] 08/28/2023 11:30 A M Medical Record Number: [id number] Patient Account  Number: [id number] Date of Birth/Sex: Treating RN: 1991-08-12 (32 y.o. [name] Primary Care [name]: [name] Pineda, [name] Other Clinician: Referring [name]: Treating Yerick Pineda/Extender: [name] in Treatment: 14 Encounter Discharge Information Items Discharge Condition: Stable Ambulatory Status: Ambulatory Discharge Destination: Home Transportation: Private Auto Accompanied By: self Schedule Follow-up Appointment: Yes Clinical Summary of Care: Patient Declined Electronic Signature(s) Signed: 08/28/2023 12:14:37 PM By: Travis Schwalbe RN Entered By: [name] on 08/28/2023 09:13:23 -------------------------------------------------------------------------------- Lower Extremity Assessment Details Patient Name: Date of Service: [name] 08/28/2023 11:30 A M Medical Record Number: [id number] Patient Account Number: [id number] Date of Birth/Sex: Treating RN: 1990-09-22 (32 y.o. [name] Primary Care Riel Hirschman: [name] Pineda, [name] Other Clinician: Referring Tira Lafferty: Treating Deari Sessler/Extender: [name] in Treatment: 14 Edema Assessment Assessed: [Left: No] [Right: No] Edema: [Left: Ye] [Right: s] Calf Left: Right: Point of Measurement: From Medial Instep 38.5 cm Ankle Left: Right: Point of Measurement: From Medial Instep 24 cm Vascular Assessment Coran, [name] ([id number]) [Right:[id number] Page 4 of 8] Pulses: Dorsalis Pedis Palpable: [Left:Yes] Extremity colors, hair growth, and conditions: Extremity Color: [Left:Hyperpigmented] Hair Growth on Extremity: [Left:Yes] Temperature of Extremity: [Left:Warm] Capillary Refill: [Left:< 3 seconds] Dependent Rubor: [Left:No No] Electronic Signature(s) Signed: 08/28/2023 12:14:37 PM By: Travis Schwalbe RN Entered By: [name] on 08/28/2023  08:41:16 -------------------------------------------------------------------------------- Multi Wound Chart Details Patient Name: Date of Service: [name] 08/28/2023 11:30 A M Medical Record Number: [id number] Patient Account Number: [id number] Date of Birth/Sex: Treating RN: 18-Nov-1990 (32 y.o. M) Primary Care Ann-Marie Kluge: [name] Pineda, Travis [name] Other Clinician: Referring Teandra Harlan: Treating [place] Schnitker/Extender: [name] in Treatment: 14 Vital Signs Height(in): 71 Pulse(bpm): 70 Weight(lbs): 184 Blood Pressure(mmHg): 138/76 Body Mass Index(BMI): 25.7 Temperature(F): 98 Respiratory Rate(breaths/min): 16 [1:Photos:] [Pineda/A:Pineda/A] Left, Lateral  Lower Leg Pineda/A Pineda/A Wound Location: Bite Pineda/A Pineda/A Wounding Event: Calciphylaxis Pineda/A Pineda/A Primary Etiology: Hypertension, End Stage Renal Pineda/A Pineda/A Comorbid History: Disease 03/09/2023 Pineda/A Pineda/A Date Acquired: 14 Pineda/A Pineda/A Weeks of Treatment: Healed - Epithelialized Pineda/A Pineda/A Wound Status: No Pineda/A Pineda/A Wound Recurrence: 0x0x0 Pineda/A Pineda/A Measurements L x W x D (cm) 0 Pineda/A Pineda/A A (cm) : rea 0 Pineda/A Pineda/A Volume (cm) : 100.00% Pineda/A Pineda/A % Reduction in Area: 100.00% Pineda/A Pineda/A % Reduction in Volume: Full Thickness Without Exposed Pineda/A Pineda/A Classification: Support Structures Medium Pineda/A Pineda/A Exudate A mount: Serosanguineous Pineda/A Pineda/A Exudate Type: red, brown Pineda/A Pineda/A Exudate Color: Distinct, outline attached Pineda/A Pineda/A Wound Margin: Medium (34-66%) Pineda/A Pineda/A Granulation A mount: Red, Pink Pineda/A Pineda/A Granulation Quality: Medium (34-66%) Pineda/A Pineda/A Necrotic A mount: Eschar Pineda/A Pineda/A Necrotic Tissue: Fat Layer (Subcutaneous Tissue): Yes Pineda/A Pineda/A Exposed Structures: Medium (34-66%) Pineda/A Pineda/A Epithelialization: Scarring: Yes Pineda/A Pineda/A Periwound Skin TextureULIS, STREETS ([id number]) [id number] Page 5 of 8 Maceration: No Pineda/A Pineda/A Periwound Skin Moisture: Dry/Scaly: No Hemosiderin Staining: No Pineda/A  Pineda/A Periwound Skin Color: No Abnormality Pineda/A Pineda/A Temperature: Yes Pineda/A Pineda/A Tenderness on Palpation: Treatment Notes Electronic Signature(s) Signed: 08/28/2023 12:31:26 PM By: [name] Entered By: Duanne Guess on 08/28/2023 08:48:46 -------------------------------------------------------------------------------- Multi-Disciplinary Care Plan Details Patient Name: Date of Service: [name] 08/28/2023 11:30 A M Medical Record Number: [id number] Patient Account Number: [id number] Date of Birth/Sex: Treating RN: [birth year] (32 y.o. [name] Primary Care Emalie Mcwethy: [name] Pineda, [name] Other Clinician: Referring Averie Meiner: Treating Fontaine Hehl/Extender: [name] in Treatment: 14 Multidisciplinary Care Plan reviewed with physician Active Inactive Electronic Signature(s) Signed: 08/28/2023 12:52:17 PM By: Travis Deed RN, BSN Entered By: [name] on 08/28/2023 08:50:51 -------------------------------------------------------------------------------- Pain Assessment Details Patient Name: Date of Service: [name] 08/28/2023 11:30 A M Medical Record Number: [id number] Patient Account Number: [id number] Date of Birth/Sex: Treating RN: 08-30-1991 (32 y.o. [name] Primary Care Jahne Krukowski: [name] Pineda, [name] Other Clinician: Referring Michaelia Beilfuss: Treating Anslie Spadafora/Extender: [name] in Treatment: 14 Active Problems Location of Pain Severity and Description of Pain Patient Has Paino No Site Locations [place], [place] ([id number]) [id number] Page 6 of 8 Pain Management and Medication Current Pain Management: Electronic Signature(s) Signed: 08/28/2023 12:14:37 PM By: Travis Schwalbe RN Entered By: [name] on 08/28/2023  08:41:06 -------------------------------------------------------------------------------- Patient/Caregiver Education Details Patient Name: Date of Service: CA RTA Marilynne Halsted Pineda 12/20/2024andnbsp11:30 A M Medical Record Number: [id number] Patient Account Number: [id number] Date of Birth/Gender: Treating RN: July 07, 1991 (32 y.o. [name] Primary Care Physician: [name] Pineda, Travis [name] Other Clinician: Referring Physician: Treating Physician/Extender: [name] in Treatment: 14 Education Assessment Education Provided To: Patient Education Topics Provided Wound/Skin Impairment: Methods: Explain/Verbal Responses: Reinforcements needed, State content correctly [employer]) Signed: 08/28/2023 12:52:17 PM By: Travis Deed RN, BSN Entered By: [name] on 08/28/2023 08:51:15 -------------------------------------------------------------------------------- Wound Assessment Details Patient Name: Date of Service: CA RTA Marilynne Halsted Pineda 08/28/2023 11:30 A M Medical Record Number: [id number] Patient Account Number: [id number] Date of Birth/Sex: Treating RN: 04-09-1991 (32 y.o. [name] Primary Care Selestino Nila: [name] Pineda, [name] Other Clinician: Referring Tarisha Fader: Treating Renly Guedes/Extender: Lucendia Herrlich [place], [name] ([id number]) [id number] Page 7 of 8 Weeks in Treatment: 14 Wound Status Wound Number: 1 Primary Etiology: Calciphylaxis Wound Location: Left, Lateral Lower Leg Wound Status: Healed - Epithelialized  Wounding Event: Bite Comorbid History: Hypertension, End Stage Renal Disease Date Acquired: 03/09/2023 Weeks Of Treatment: 14 Clustered Wound: No Photos Wound Measurements Length: (cm) Width: (cm) Depth: (cm) Area: (cm) Volume: (cm) 0 % Reduction in Area: 100% 0 % Reduction in Volume: 100% 0 Epithelialization: Medium (34-66%) 0 Tunneling: No 0  Undermining: No Wound Description Classification: Full Thickness Without Exposed Support Structures Wound Margin: Distinct, outline attached Exudate Amount: Medium Exudate Type: Serosanguineous Exudate Color: red, brown Foul Odor After Cleansing: No Slough/Fibrino Yes Wound Bed Granulation Amount: Medium (34-66%) Exposed Structure Granulation Quality: Red, Pink Fat Layer (Subcutaneous Tissue) Exposed: Yes Necrotic Amount: Medium (34-66%) Necrotic Quality: Eschar Periwound Skin Texture Texture Color No Abnormalities Noted: No No Abnormalities Noted: Yes Scarring: Yes Temperature / Pain Temperature: No Abnormality Moisture No Abnormalities Noted: No Tenderness on Palpation: Yes Dry / Scaly: No Maceration: No Electronic Signature(s) Signed: 08/28/2023 12:52:17 PM By: Travis Deed RN, BSN Previous Signature: 08/28/2023 11:46:55 AM Version By: [place] Scrape Entered By: [name] on 08/28/2023 08:47:16 -------------------------------------------------------------------------------- Vitals Details Patient Name: Date of Service: [name] 08/28/2023 11:30 A M Medical Record Number: [id number] Patient Account Number: [id number] Date of Birth/Sex: Treating RN: 11-03-1990 (32 y.o. [name] Primary Care Kayliee Atienza: [name] Pineda, [name] Other Clinician: Referring Bintou Lafata: Treating Nereyda Bowler/Extender: Lucendia Herrlich [place], [name] ([id number]) [id number] Page 8 of 8 Weeks in Treatment: 14 Vital Signs Time Taken: 11:40 Temperature (F): 98 Height (in): 71 Pulse (bpm): 70 Weight (lbs): 184 Respiratory Rate (breaths/min): 16 Body Mass Index (BMI): 25.7 Blood Pressure (mmHg): 138/76 Reference Range: 80 - 120 mg / dl Electronic Signature(s) Signed: 08/28/2023 12:14:37 PM By: Travis Schwalbe RN Entered By: [name] on 08/28/2023 08:40:53
